# Patient Record
Sex: Female | Born: 1955 | Race: White | Hispanic: No | Marital: Married | Smoking: Former smoker
Health system: Southern US, Community
[De-identification: ages and names within clinical notes are randomized; demographics above are authoritative.]

## PROBLEM LIST (undated history)

## (undated) DIAGNOSIS — R112 Nausea with vomiting, unspecified: Secondary | ICD-10-CM

## (undated) DIAGNOSIS — Z87442 Personal history of urinary calculi: Secondary | ICD-10-CM

## (undated) DIAGNOSIS — F329 Major depressive disorder, single episode, unspecified: Secondary | ICD-10-CM

## (undated) DIAGNOSIS — Z923 Personal history of irradiation: Secondary | ICD-10-CM

## (undated) DIAGNOSIS — R0600 Dyspnea, unspecified: Secondary | ICD-10-CM

## (undated) DIAGNOSIS — Z9889 Other specified postprocedural states: Secondary | ICD-10-CM

## (undated) DIAGNOSIS — K759 Inflammatory liver disease, unspecified: Secondary | ICD-10-CM

## (undated) DIAGNOSIS — F32A Depression, unspecified: Secondary | ICD-10-CM

## (undated) DIAGNOSIS — E785 Hyperlipidemia, unspecified: Secondary | ICD-10-CM

## (undated) DIAGNOSIS — C801 Malignant (primary) neoplasm, unspecified: Secondary | ICD-10-CM

## (undated) DIAGNOSIS — B029 Zoster without complications: Secondary | ICD-10-CM

## (undated) DIAGNOSIS — Z9221 Personal history of antineoplastic chemotherapy: Secondary | ICD-10-CM

## (undated) DIAGNOSIS — Z803 Family history of malignant neoplasm of breast: Secondary | ICD-10-CM

## (undated) DIAGNOSIS — Z808 Family history of malignant neoplasm of other organs or systems: Secondary | ICD-10-CM

## (undated) DIAGNOSIS — Z800 Family history of malignant neoplasm of digestive organs: Secondary | ICD-10-CM

## (undated) DIAGNOSIS — G43909 Migraine, unspecified, not intractable, without status migrainosus: Secondary | ICD-10-CM

## (undated) DIAGNOSIS — G4700 Insomnia, unspecified: Secondary | ICD-10-CM

## (undated) HISTORY — DX: Major depressive disorder, single episode, unspecified: F32.9

## (undated) HISTORY — DX: Insomnia, unspecified: G47.00

## (undated) HISTORY — PX: TUBAL LIGATION: SHX77

## (undated) HISTORY — DX: Family history of malignant neoplasm of breast: Z80.3

## (undated) HISTORY — PX: BREAST EXCISIONAL BIOPSY: SUR124

## (undated) HISTORY — DX: Hyperlipidemia, unspecified: E78.5

## (undated) HISTORY — DX: Inflammatory liver disease, unspecified: K75.9

## (undated) HISTORY — DX: Migraine, unspecified, not intractable, without status migrainosus: G43.909

## (undated) HISTORY — DX: Depression, unspecified: F32.A

## (undated) HISTORY — DX: Family history of malignant neoplasm of other organs or systems: Z80.8

## (undated) HISTORY — DX: Family history of malignant neoplasm of digestive organs: Z80.0

## (undated) HISTORY — DX: Zoster without complications: B02.9

---

## 2000-08-26 HISTORY — PX: KNEE ARTHROSCOPY: SUR90

## 2001-08-26 DIAGNOSIS — B029 Zoster without complications: Secondary | ICD-10-CM

## 2001-08-26 HISTORY — DX: Zoster without complications: B02.9

## 2003-01-28 ENCOUNTER — Ambulatory Visit (HOSPITAL_BASED_OUTPATIENT_CLINIC_OR_DEPARTMENT_OTHER): Admission: RE | Admit: 2003-01-28 | Discharge: 2003-01-28 | Payer: Self-pay | Admitting: Orthopedic Surgery

## 2003-02-10 ENCOUNTER — Encounter: Admission: RE | Admit: 2003-02-10 | Discharge: 2003-03-04 | Payer: Self-pay | Admitting: Orthopedic Surgery

## 2010-01-17 ENCOUNTER — Other Ambulatory Visit: Admission: RE | Admit: 2010-01-17 | Discharge: 2010-01-17 | Payer: Self-pay | Admitting: Internal Medicine

## 2010-01-17 LAB — HM PAP SMEAR

## 2010-01-31 ENCOUNTER — Encounter: Admission: RE | Admit: 2010-01-31 | Discharge: 2010-01-31 | Payer: Self-pay | Admitting: Internal Medicine

## 2010-02-12 ENCOUNTER — Ambulatory Visit (HOSPITAL_COMMUNITY): Admission: RE | Admit: 2010-02-12 | Discharge: 2010-02-12 | Payer: Self-pay | Admitting: Internal Medicine

## 2011-01-11 NOTE — Op Note (Signed)
   NAME:  [name], Rebecca                            ACCOUNT NO.:  [id number]   MEDICAL RECORD NO.:  [id number]                   PATIENT TYPE:  AMB   LOCATION:  [facility]SC                                  FACILITY:  [facility]   PHYSICIAN:  Thera Flake., M.D.             DATE OF BIRTH:  Aug 02, 1956   DATE OF PROCEDURE:  01/28/2003  DATE OF DISCHARGE:                                 OPERATIVE REPORT   INDICATIONS FOR PROCEDURE:  A 55 year old with a MRI proven bucket-handle  meniscus tear of the right knee thought to be amendable to outpatient  arthroscopy.   PREOPERATIVE DIAGNOSIS:  Bucket-handle tear of the medial meniscus, right  knee.   POSTOPERATIVE DIAGNOSIS:  Bucket-handle tear of the medial meniscus, right  knee.   OPERATION:  Partial medial meniscectomy (approximately 50%).   SURGEON:  Dyke Brackett, M.D.   ANESTHESIA:  MAC   DESCRIPTION OF PROCEDURE:  Inferior medial inferolateral portal,  patellofemoral joint, lateral compartment, lateral meniscus, ACL and PCL  were essentially normal. The medial meniscus showed a bucket-handle tear. It  was more of a bucket-handle component that I would say would be at the  posterior half of the meniscus. It required a majority of the posterior horn  did have to be resected and at most 50% of the meniscus was resected, 50%  was preserved. Some mild chondral damage from the meniscus tear but nothing  severe, this was debrided as well. Knee drained free of fluid, closed with  nylon, lightly compressive sterile dressing applied, taken to the recovery  room in stable condition.                                               Thera Flake., M.D.    WDC/MEDQ  D:  01/28/2003  T:  01/28/2003  Job:  [id number]

## 2012-11-17 ENCOUNTER — Other Ambulatory Visit: Payer: Self-pay | Admitting: Obstetrics and Gynecology

## 2012-11-17 DIAGNOSIS — D259 Leiomyoma of uterus, unspecified: Secondary | ICD-10-CM

## 2012-11-17 DIAGNOSIS — N920 Excessive and frequent menstruation with regular cycle: Secondary | ICD-10-CM

## 2013-03-08 LAB — HM COLONOSCOPY

## 2013-06-20 ENCOUNTER — Encounter: Payer: Self-pay | Admitting: Internal Medicine

## 2013-06-21 ENCOUNTER — Encounter: Payer: Self-pay | Admitting: Internal Medicine

## 2013-07-02 ENCOUNTER — Ambulatory Visit: Payer: BC Managed Care – PPO | Admitting: Emergency Medicine

## 2013-07-02 ENCOUNTER — Encounter: Payer: Self-pay | Admitting: Internal Medicine

## 2013-07-02 ENCOUNTER — Encounter: Payer: Self-pay | Admitting: Emergency Medicine

## 2013-07-02 VITALS — BP 138/64 | HR 60 | Temp 98.6°F | Resp 16 | Ht 67.0 in | Wt 155.0 lb

## 2013-07-02 DIAGNOSIS — G4700 Insomnia, unspecified: Secondary | ICD-10-CM | POA: Insufficient documentation

## 2013-07-02 DIAGNOSIS — R030 Elevated blood-pressure reading, without diagnosis of hypertension: Secondary | ICD-10-CM

## 2013-07-02 DIAGNOSIS — E785 Hyperlipidemia, unspecified: Secondary | ICD-10-CM | POA: Insufficient documentation

## 2013-07-02 DIAGNOSIS — E559 Vitamin D deficiency, unspecified: Secondary | ICD-10-CM

## 2013-07-02 LAB — CBC WITH DIFFERENTIAL/PLATELET
Basophils Absolute: 0 10*3/uL (ref 0.0–0.1)
Eosinophils Relative: 1 % (ref 0–5)
HCT: 41.6 % (ref 36.0–46.0)
Lymphocytes Relative: 25 % (ref 12–46)
Lymphs Abs: 1.5 10*3/uL (ref 0.7–4.0)
MCV: 89.3 fL (ref 78.0–100.0)
Monocytes Absolute: 0.5 10*3/uL (ref 0.1–1.0)
Neutro Abs: 4 10*3/uL (ref 1.7–7.7)
RBC: 4.66 MIL/uL (ref 3.87–5.11)
WBC: 6 10*3/uL (ref 4.0–10.5)

## 2013-07-02 LAB — HEPATIC FUNCTION PANEL
ALT: 10 U/L (ref 0–35)
Albumin: 4.5 g/dL (ref 3.5–5.2)
Alkaline Phosphatase: 49 U/L (ref 39–117)
Bilirubin, Direct: 0.2 mg/dL (ref 0.0–0.3)
Indirect Bilirubin: 1.3 mg/dL — ABNORMAL HIGH (ref 0.0–0.9)
Total Bilirubin: 1.5 mg/dL — ABNORMAL HIGH (ref 0.3–1.2)
Total Protein: 7.3 g/dL (ref 6.0–8.3)

## 2013-07-02 LAB — LIPID PANEL
HDL: 70 mg/dL (ref >39)
LDL Cholesterol: 123 mg/dL — ABNORMAL HIGH (ref 0–99)
Triglycerides: 50 mg/dL (ref <150)
VLDL: 10 mg/dL (ref 0–40)

## 2013-07-02 LAB — MAGNESIUM: Magnesium: 1.8 mg/dL (ref 1.5–2.5)

## 2013-07-02 MED ORDER — ZOLPIDEM TARTRATE 5 MG PO TABS: 5.0000 mg | ORAL_TABLET | Freq: Every evening | ORAL | Status: DC | PRN

## 2013-07-02 NOTE — Patient Instructions (Signed)
Hypertension Hypertension is another name for high blood pressure. High blood pressure may mean that your heart needs to work harder to pump blood. Blood pressure consists of two numbers, which includes a higher number over a lower number (example: 110/72). HOME CARE   Make lifestyle changes as told by your doctor. This may include weight loss and exercise.  Take your blood pressure medicine every day.  Limit how much salt you use.  Stop smoking if you smoke.  Do not use drugs.  Talk to your doctor if you are using decongestants or birth control pills. These medicines might make blood pressure higher.  Females should not drink more than 1 alcoholic drink per day. Males should not drink more than 2 alcoholic drinks per day.  See your doctor as told. GET HELP RIGHT AWAY IF:   You have a blood pressure reading with a top number of 180 or higher.  You get a very bad headache.  You get blurred or changing vision.  You feel confused.  You feel weak, numb, or faint.  You get chest or belly (abdominal) pain.  You throw up (vomit).  You cannot breathe very well. MAKE SURE YOU:   Understand these instructions.  Will watch your condition.  Will get help right away if you are not doing well or get worse. Document Released: 01/29/2008 Document Revised: 11/04/2011 Document Reviewed: 01/29/2008 [facility] Patient Information 2014 [place], [place]. Hypercholesterolemia High Blood Cholesterol Cholesterol is a white, waxy, fat-like protein needed by your body in small amounts. The liver makes all the cholesterol you need. It is carried from the liver by the blood through the blood vessels. Deposits (plaque) may build up on blood vessel walls. This makes the arteries narrower and stiffer. Plaque increases the risk for heart attack and stroke. You cannot feel your cholesterol level even if it is very high. The only way to know is by a blood test to check your lipid (fats) levels. Once you  know your cholesterol levels, you should keep a record of the test results. Work with your caregiver to to keep your levels in the desired range. WHAT THE RESULTS MEAN:  Total cholesterol is a rough measure of all the cholesterol in your blood.  LDL is the so-called bad cholesterol. This is the type that deposits cholesterol in the walls of the arteries. You want this level to be low.  HDL is the good cholesterol because it cleans the arteries and carries the LDL away. You want this level to be high.  Triglycerides are fat that the body can either burn for energy or store. High levels are closely linked to heart disease. DESIRED LEVELS:  Total cholesterol below 200.  LDL below 100 for people at risk, below 70 for very high risk.  HDL above 50 is good, above 60 is best.  Triglycerides below 150. HOW TO LOWER YOUR CHOLESTEROL:  Diet.  Choose fish or white meat chicken and [place], roasted or baked. Limit fatty cuts of red meat, fried foods, and processed meats, such as sausage and lunch meat.  Eat lots of fresh fruits and vegetables. Choose whole grains, beans, pasta, potatoes and cereals.  Use only small amounts of olive, corn or canola oils. Avoid butter, mayonnaise, shortening or palm kernel oils. Avoid foods with trans-fats.  Use skim/nonfat milk and low-fat/nonfat yogurt and cheeses. Avoid whole milk, cream, ice cream, egg yolks and cheeses. Healthy desserts include angel food cake, gingersnaps, animal crackers, hard candy, popsicles, and low-fat/nonfat frozen yogurt.  Avoid pastries, cakes, pies and cookies.  Exercise.  A regular program helps decrease LDL and raises HDL.  Helps with weight control.  Do things that increase your activity level like gardening, walking, or taking the stairs.  Medication.  May be prescribed by your caregiver to help lowering cholesterol and the risk for heart disease.  You may need medicine even if your levels are normal if you have several  risk factors. HOME CARE INSTRUCTIONS   Follow your diet and exercise programs as suggested by your caregiver.  Take medications as directed.  Have blood work done when your caregiver feels it is necessary. MAKE SURE YOU:   Understand these instructions.  Will watch your condition.  Will get help right away if you are not doing well or get worse. Document Released: 08/12/2005 Document Revised: 11/04/2011 Document Reviewed: 01/28/2007 [facility] Patient Information 2014 [place], [place]. Insomnia Insomnia means you have trouble falling or staying asleep. It affects about one person in three at different times and is usually related to stress from work, school, or personal relations. Insomnia is also a sign of depression or anxiety. Other medical problems that cause insomnia include conditions that cause pain, night leg cramps, coughing, shortness of breath, urinary problems, and fevers. Sleep apnea is an abnormal breathing pattern at night that can cause insomnia and loud snoring. Certain medications and excess intake of caffeine drinks (coffee, tea, colas) can also interfere with normal sleep. Treatment for insomnia depends on the cause. Besides specific medical treatment, the following measures can help you relax and get better sleep. Get regular exercise every day, at least several hours before bed time. Try to get to bed at the same time every night. Take a hot bath before retiring to help you relax. Do not stay in bed if you are unable to sleep. During the daytime avoid staying in bed to watch television, eat, or read. Reduce unwanted noise and light in your room. Keep your room at a comfortable temperature. Avoid alcohol as it causes one to sleep less soundly, may cause you to awaken during the night, and can leave you feeling groggy the next day. Using a mild sedative prescribed or suggested by your caregiver may be needed, but the daily use of sleeping pills is not recommended. Anti-depressant  medicines can improve sleep in people with depression. Please call your doctor for follow up care to better understand the cause and proper treatment of your insomnia. Document Released: 09/19/2004 Document Revised: 11/04/2011 Document Reviewed: 08/12/2005 [facility] Patient Information 2014 [place], [place].

## 2013-07-05 NOTE — Progress Notes (Signed)
  Subjective:    Patient ID: Rebecca [name], female    DOB: October 06, 1955, 57 y.o.   MRN: [id number]  HPI Comments: 57 yo female presents for recheck cholesterol with declining RX for Statins or herbals and desire to try with dietary changes. Last labs T 247 TG 104 HDL 70 LDL 156. She has been doing well overall. She notes her sleep is still interupted even with the Ambien. Denies any new stressors.   Hyperlipidemia    Current Outpatient Prescriptions on File Prior to Visit  Medication Sig Dispense Refill  . ambrisentan (LETAIRIS) 5 MG tablet Take 5 mg by mouth daily.       No current facility-administered medications on file prior to visit.   ALLERGIES Citalopram; Citalopram; Dexamethasone; Dexamethasone; and Red yeast rice  Past Medical History  Diagnosis Date  . Hyperlipidemia   . Insomnia   . Migraines     HORMONAL  . Shingles outbreak 2003    RIGHT HIP  . Depression     REMISSION/ SITUATIONAL  . Hepatitis AGE 44    RESOLVED     Review of Systems  Psychiatric/Behavioral: Positive for sleep disturbance.  All other systems reviewed and are negative.    BP 138/64  Pulse 60  Temp(Src) 98.6 F (37 C) (Temporal)  Resp 16  Ht 5\' 7"  (1.702 m)  Wt 155 lb (70.308 kg)  BMI 24.27 kg/m2     Objective:   Physical Exam  Nursing note and vitals reviewed. Constitutional: She is oriented to person, place, and time. She appears well-developed and well-nourished.  HENT:  Head: Normocephalic and atraumatic.  Right Ear: External ear normal.  Left Ear: External ear normal.  Nose: Nose normal.  Mouth/Throat: Oropharynx is clear and moist.  Large tongue partially obstructs airway  Eyes: EOM are normal.  Neck: Normal range of motion. Neck supple.  Cardiovascular: Normal rate, regular rhythm, normal heart sounds and intact distal pulses.   Pulmonary/Chest: Effort normal and breath sounds normal.  Abdominal: Soft. Bowel sounds are normal.  Musculoskeletal: Normal range of motion.   Neurological: She is alert and oriented to person, place, and time.  Skin: Skin is warm and dry.  Psychiatric: She has a normal mood and affect. Judgment normal.          Assessment & Plan:  Cholesterol recheck with labs. Insomnia history advised of sleep hygiene w/c if symptoms do not improve for sleep study.

## 2013-08-31 ENCOUNTER — Other Ambulatory Visit: Payer: Self-pay | Admitting: Emergency Medicine

## 2013-08-31 DIAGNOSIS — G4700 Insomnia, unspecified: Secondary | ICD-10-CM

## 2013-08-31 MED ORDER — ZOLPIDEM TARTRATE 5 MG PO TABS: 5.0000 mg | ORAL_TABLET | Freq: Every evening | ORAL | Status: DC | PRN

## 2013-09-02 ENCOUNTER — Other Ambulatory Visit: Payer: Self-pay | Admitting: Emergency Medicine

## 2013-09-02 DIAGNOSIS — G4700 Insomnia, unspecified: Secondary | ICD-10-CM

## 2013-09-02 MED ORDER — ZOLPIDEM TARTRATE 5 MG PO TABS: 5.0000 mg | ORAL_TABLET | Freq: Every evening | ORAL | Status: DC | PRN

## 2013-09-03 ENCOUNTER — Other Ambulatory Visit: Payer: Self-pay | Admitting: Emergency Medicine

## 2013-09-03 MED ORDER — PROMETHAZINE HCL 25 MG PO TABS: 25.0000 mg | ORAL_TABLET | Freq: Four times a day (QID) | ORAL | Status: DC | PRN

## 2013-09-03 MED ORDER — BUTALBITAL-APAP-CAFFEINE 50-325-40 MG PO TABS: 1.0000 | ORAL_TABLET | Freq: Four times a day (QID) | ORAL | Status: DC | PRN

## 2013-09-28 ENCOUNTER — Telehealth: Payer: Self-pay

## 2013-09-28 NOTE — Telephone Encounter (Signed)
Please advise patient Rebecca [name] needs OV, rash could be shingles/ yeast  Or infection.

## 2013-09-28 NOTE — Telephone Encounter (Signed)
Pt called. States she's had a rash under breast for about a week. Thinks it may be shingles. Denies pain. Told pt she sched OV to evaluate, but wants your opinion. Please advise.

## 2013-09-29 ENCOUNTER — Encounter: Payer: Self-pay | Admitting: Emergency Medicine

## 2013-09-29 ENCOUNTER — Ambulatory Visit (INDEPENDENT_AMBULATORY_CARE_PROVIDER_SITE_OTHER): Payer: BC Managed Care – PPO | Admitting: Emergency Medicine

## 2013-09-29 VITALS — BP 138/72 | HR 62 | Temp 98.0°F | Resp 16 | Ht 67.0 in | Wt 155.0 lb

## 2013-09-29 DIAGNOSIS — R5381 Other malaise: Secondary | ICD-10-CM

## 2013-09-29 DIAGNOSIS — E782 Mixed hyperlipidemia: Secondary | ICD-10-CM

## 2013-09-29 DIAGNOSIS — G4700 Insomnia, unspecified: Secondary | ICD-10-CM

## 2013-09-29 DIAGNOSIS — I10 Essential (primary) hypertension: Secondary | ICD-10-CM

## 2013-09-29 DIAGNOSIS — R5383 Other fatigue: Secondary | ICD-10-CM

## 2013-09-29 DIAGNOSIS — R21 Rash and other nonspecific skin eruption: Secondary | ICD-10-CM

## 2013-09-29 LAB — CBC WITH DIFFERENTIAL/PLATELET
BASOS PCT: 1 % (ref 0–1)
Basophils Absolute: 0 10*3/uL (ref 0.0–0.1)
Eosinophils Absolute: 0 10*3/uL (ref 0.0–0.7)
Eosinophils Relative: 1 % (ref 0–5)
HEMATOCRIT: 39.8 % (ref 36.0–46.0)
HEMOGLOBIN: 13.5 g/dL (ref 12.0–15.0)
LYMPHS ABS: 1.5 10*3/uL (ref 0.7–4.0)
LYMPHS PCT: 27 % (ref 12–46)
MCH: 30.2 pg (ref 26.0–34.0)
MCHC: 33.9 g/dL (ref 30.0–36.0)
MCV: 89 fL (ref 78.0–100.0)
MONOS PCT: 7 % (ref 3–12)
Monocytes Absolute: 0.4 10*3/uL (ref 0.1–1.0)
NEUTROS ABS: 3.8 10*3/uL (ref 1.7–7.7)
NEUTROS PCT: 64 % (ref 43–77)
PLATELETS: 221 10*3/uL (ref 150–400)
RBC: 4.47 MIL/uL (ref 3.87–5.11)
RDW: 13.4 % (ref 11.5–15.5)
WBC: 5.8 10*3/uL (ref 4.0–10.5)

## 2013-09-29 LAB — HEPATIC FUNCTION PANEL
ALT: 19 U/L (ref 0–35)
AST: 17 U/L (ref 0–37)
Albumin: 4.4 g/dL (ref 3.5–5.2)
Alkaline Phosphatase: 49 U/L (ref 39–117)
BILIRUBIN DIRECT: 0.2 mg/dL (ref 0.0–0.3)
BILIRUBIN INDIRECT: 1.2 mg/dL (ref 0.2–1.2)
BILIRUBIN TOTAL: 1.4 mg/dL — AB (ref 0.2–1.2)
TOTAL PROTEIN: 6.9 g/dL (ref 6.0–8.3)

## 2013-09-29 LAB — BASIC METABOLIC PANEL WITH GFR
BUN: 14 mg/dL (ref 6–23)
CO2: 27 mEq/L (ref 19–32)
Calcium: 9.1 mg/dL (ref 8.4–10.5)
Chloride: 101 mEq/L (ref 96–112)
Creat: 0.5 mg/dL (ref 0.50–1.10)
GFR, Est African American: >89 mL/min
GFR, Est Non African American: >89 mL/min
Glucose, Bld: 86 mg/dL (ref 70–99)
POTASSIUM: 4.2 meq/L (ref 3.5–5.3)
SODIUM: 138 meq/L (ref 135–145)

## 2013-09-29 LAB — LIPID PANEL
CHOLESTEROL: 207 mg/dL — AB (ref 0–200)
HDL: 75 mg/dL (ref >39)
LDL Cholesterol: 117 mg/dL — ABNORMAL HIGH (ref 0–99)
Total CHOL/HDL Ratio: 2.8 Ratio
Triglycerides: 73 mg/dL (ref <150)
VLDL: 15 mg/dL (ref 0–40)

## 2013-09-29 LAB — SEDIMENTATION RATE: Sed Rate: 1 mm/hr (ref 0–22)

## 2013-09-29 MED ORDER — FLUCONAZOLE 150 MG PO TABS: 150.0000 mg | ORAL_TABLET | ORAL | Status: DC

## 2013-09-29 MED ORDER — DOXYCYCLINE HYCLATE 100 MG PO TABS: 100.0000 mg | ORAL_TABLET | Freq: Two times a day (BID) | ORAL | Status: DC

## 2013-09-29 MED ORDER — ZOLPIDEM TARTRATE 5 MG PO TABS: 5.0000 mg | ORAL_TABLET | Freq: Every evening | ORAL | Status: DC | PRN

## 2013-09-29 NOTE — Patient Instructions (Addendum)
Shingles Shingles is caused by the same virus that causes chickenpox. The first feelings may be pain or tingling. A rash will follow in a couple days. The rash may occur on any area of the body. Long-lasting pain is more likely in an elderly person. It can last months to years. There are medicines that can help prevent pain if you start taking them early. HOME CARE   Place cool cloths on the rash.  Only take medicine as told by your doctor.  You may use calamine lotion to relieve itchy skin.  Avoid touching:  Babies.  Children with inflamed skin (eczema).  People who have gotten transplanted organs.  People with chronic illnesses, such as leukemia and AIDS.  If the rash is on the face, you may need to see a specialist. Keep all appointments. Shingles must be kept away from the eyes, if possible.  Keep all appointments.  Avoid touching the eyes or eye area, if possible. GET HELP RIGHT AWAY IF:   You have any pain on the face or eye.  Your medicines do not help.  Your redness or puffiness (swelling) spreads.  You have a fever.  You notice any red lines going away from the rash area. MAKE SURE YOU:   Understand these instructions.  Will watch your condition.  Will get help right away if you are not doing well or get worse. Document Released: 01/29/2008 Document Revised: 11/04/2011 Document Reviewed: 01/29/2008 [facility] Patient Information 2014 [place], [place]. Rash A rash is a change in the color or feel of your skin. There are many different types of rashes. You may have other problems along with your rash. HOME CARE  Avoid the thing that caused your rash.  Do not scratch your rash.  You may take cools baths to help stop itching.  Only take medicines as told by your doctor.  Keep all doctor visits as told. GET HELP RIGHT AWAY IF:   Your pain, puffiness (swelling), or redness gets worse.  You have a fever.  You have new or severe problems.  You have body  aches, watery poop (diarrhea), or you throw up (vomit).  Your rash is not better after 3 days. MAKE SURE YOU:   Understand these instructions.  Will watch your condition.  Will get help right away if you are not doing well or get worse. Document Released: 01/29/2008 Document Revised: 11/04/2011 Document Reviewed: 05/27/2011 [facility] Patient Information 2014 [place], [place]. Menopause Periden c Vitamin for mood Menopause is the normal time of life when menstrual periods stop completely. Menopause is complete when you have missed 12 consecutive menstrual periods. It usually occurs between the ages of 18 years and 1 years. Very rarely does a woman develop menopause before the age of 1 years. At menopause, your ovaries stop producing the female hormones estrogen and progesterone. This can cause undesirable symptoms and also affect your health. Sometimes the symptoms may occur 4 5 years before the menopause begins. There is no relationship between menopause and:  Oral contraceptives.  Number of children you had.  Race.  The age your menstrual periods started (menarche). Heavy smokers and very thin women may develop menopause earlier in life. CAUSES  The ovaries stop producing the female hormones estrogen and progesterone.  Other causes include:  Surgery to remove both ovaries.  The ovaries stop functioning for no known reason.  Tumors of the pituitary gland in the brain.  Medical disease that affects the ovaries and hormone production.  Radiation treatment to the  abdomen or pelvis.  Chemotherapy that affects the ovaries. SYMPTOMS   Hot flashes.  Night sweats.  Decrease in sex drive.  Vaginal dryness and thinning of the vagina causing painful intercourse.  Dryness of the skin and developing wrinkles.  Headaches.  Tiredness.  Irritability.  Memory problems.  Weight gain.  Bladder infections.  Hair growth of the face and chest.  Infertility. More serious  symptoms include:  Loss of bone (osteoporosis) causing breaks (fractures).  Depression.  Hardening and narrowing of the arteries (atherosclerosis) causing heart attacks and strokes. DIAGNOSIS   When the menstrual periods have stopped for 12 straight months.  Physical exam.  Hormone studies of the blood. TREATMENT  There are many treatment choices and nearly as many questions about them. The decisions to treat or not to treat menopausal changes is an individual choice made with your health care provider. Your health care provider can discuss the treatments with you. Together, you can decide which treatment will work best for you. Your treatment choices may include:   Hormone therapy (estrogen and progesterone).  Non-hormonal medicines.  Treating the individual symptoms with medicine (for example antidepressants for depression).  Herbal medicines that may help specific symptoms.  Counseling by a psychiatrist or psychologist.  Group therapy.  Lifestyle changes including:  Eating healthy.  Regular exercise.  Limiting caffeine and alcohol.  Stress management and meditation.  No treatment. HOME CARE INSTRUCTIONS   Take the medicine your health care provider gives you as directed.  Get plenty of sleep and rest.  Exercise regularly.  Eat a diet that contains calcium (good for the bones) and soy products (acts like estrogen hormone).  Avoid alcoholic beverages.  Do not smoke.  If you have hot flashes, dress in layers.  Take supplements, calcium, and vitamin D to strengthen bones.  You can use over-the-counter lubricants or moisturizers for vaginal dryness.  Group therapy is sometimes very helpful.  Acupuncture may be helpful in some cases. SEEK MEDICAL CARE IF:   You are not sure you are in menopause.  You are having menopausal symptoms and need advice and treatment.  You are still having menstrual periods after age 34 years.  You have pain with  intercourse.  Menopause is complete (no menstrual period for 12 months) and you develop vaginal bleeding.  You need a referral to a specialist (gynecologist, psychiatrist, or psychologist) for treatment. SEEK IMMEDIATE MEDICAL CARE IF:   You have severe depression.  You have excessive vaginal bleeding.  You fell and think you have a broken bone.  You have pain when you urinate.  You develop leg or chest pain.  You have a fast pounding heart beat (palpitations).  You have severe headaches.  You develop vision problems.  You feel a lump in your breast.  You have abdominal pain or severe indigestion. Document Released: 11/02/2003 Document Revised: 04/14/2013 Document Reviewed: 03/11/2013 [facility] Patient Information 2014 [place], [place].

## 2013-09-29 NOTE — Progress Notes (Signed)
Subjective:    Patient ID: Rebecca [name], female    DOB: 12-Feb-1956, 58 y.o.   MRN: [id number]  HPI Comments: 58 yo female presents with ongoing rash. She notes 09/18/13 rash started under breasts. 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 She notes itch and tingle. She notes no new/ unusual exposures. SHE HAS ALSO NOTICED INCREASED FATIGUE with out any known cause.  She notes mammo up to date and she has started menopause without cycle x 2 months. She has noticed mild mood fluctuations. She denies hot flashes. She wants to try to manage with herbals with FHX breast CA.   She also needs presents for 3 month F/U for Cholesterol,  D. Deficient. She is trying to decrease bad diet and improve exercise. SHe did not add more supplements for cholesterol and doe not want to start cholesterol RX.  LAST LABS CHOL         207   09/29/2013 HDL           75   09/29/2013 LDLCALC      117   09/29/2013 TRIG          73   09/29/2013 CHOLHDL      2.8   09/29/2013 ALT           19   09/29/2013 AST           17   09/29/2013 ALKPHOS       49   09/29/2013 BILITOT      1.4   09/29/2013 CREATININE     0.50   09/29/2013 BUN              14   09/29/2013 NA              138   09/29/2013 K               4.2   09/29/2013 CL              101   09/29/2013 CO2              27   09/29/2013 WBC      5.8   09/29/2013 HGB     13.5   09/29/2013 HCT     39.8   09/29/2013 MCV     89.0   09/29/2013 PLT      221   09/29/2013    Current Outpatient Prescriptions on File Prior to Visit  Medication Sig Dispense Refill  . cholecalciferol (VITAMIN D) 1000 UNITS tablet Take 1,000 Units by mouth daily.      . magnesium gluconate (MAGONATE) 500 MG tablet Take 500 mg by mouth. 1/2 qd      . butalbital-acetaminophen-caffeine (FIORICET, ESGIC) 50-325-40 MG per tablet Take 1 tablet by mouth every 6 (six) hours as needed for headache.  30 tablet  0  . promethazine (PHENERGAN) 25 MG tablet Take 1 tablet (25 mg total) by mouth every 6 (six) hours  as needed for nausea or vomiting. For nausea associated with migraine  30 tablet  0   No current facility-administered medications on file prior to visit.   ALLERGIES Allergies  Allergen Reactions  . Citalopram Other (See Comments)    HYPERACTIVITY  . Citalopram     Hyper  . Dexamethasone Other (See Comments)    CHEST TIGHTNESS  . Dexamethasone     Chest tightness  . Red Yeast Rice [Cholestin] Other (See Comments)    headache  Past Medical History  Diagnosis Date  . Hyperlipidemia   . Insomnia   . Migraines     HORMONAL  . Shingles outbreak 2003    RIGHT HIP  . Depression     REMISSION/ SITUATIONAL  . Hepatitis AGE 29    RESOLVED      Review of Systems  Constitutional: Positive for fatigue.  Skin: Positive for rash.  All other systems reviewed and are negative.       Objective:   Physical Exam  Nursing note and vitals reviewed. Constitutional: She is oriented to person, place, and time. She appears well-developed and well-nourished. No distress.  HENT:  Head: Normocephalic and atraumatic.  Right Ear: External ear normal.  Left Ear: External ear normal.  Nose: Nose normal.  Mouth/Throat: Oropharynx is clear and moist.  Eyes: Conjunctivae and EOM are normal.  Neck: Normal range of motion. Neck supple. No JVD present. No thyromegaly present.  Cardiovascular: Normal rate, regular rhythm, normal heart sounds and intact distal pulses.   Pulmonary/Chest: Effort normal and breath sounds normal.  Abdominal: Soft. Bowel sounds are normal. She exhibits no distension and no mass. There is no tenderness. There is no rebound and no guarding.  Genitourinary:  Breast are Fibrocystic  Musculoskeletal: Normal range of motion. She exhibits no edema and no tenderness.  Lymphadenopathy:    She has no cervical adenopathy.  Neurological: She is alert and oriented to person, place, and time. No cranial nerve deficit.  Skin: Skin is warm and dry. No rash noted. No erythema. No  pallor.  Under each breast and between breasts erythematous papules 3- mm elevated almost blister appearing, chest area splotchy red flat   Psychiatric: She has a normal mood and affect. Her behavior is normal. Judgment and thought content normal.          Assessment & Plan:  1.  3 month F/U for Cholesterol, D. Deficient. Needs healthy diet, cardio QD and obtain healthy weight. Check Labs, Check BP if >130/80 call office 2. Mood changes with new onset menopause- Periden C OTC/ Advised with fHX breast CA no HRT. Declines Br[place]. 3. ? Rash under Both breasts- Needs to get Mammogram updated/ we will request last mammo. Concern for shingles with appearance but with bilateral rash will treat for yeast vs infection- Doxy 100 and Diflucan 150 both AD. CK labs 4. Fatigue- check labs, increase activity and H2O

## 2013-10-12 ENCOUNTER — Ambulatory Visit: Payer: Self-pay | Admitting: Emergency Medicine

## 2013-11-01 ENCOUNTER — Other Ambulatory Visit: Payer: Self-pay | Admitting: Emergency Medicine

## 2013-11-01 DIAGNOSIS — G4700 Insomnia, unspecified: Secondary | ICD-10-CM

## 2013-11-01 MED ORDER — ZOLPIDEM TARTRATE 5 MG PO TABS: 5.0000 mg | ORAL_TABLET | Freq: Every evening | ORAL | Status: DC | PRN

## 2013-12-05 ENCOUNTER — Other Ambulatory Visit: Payer: Self-pay | Admitting: Emergency Medicine

## 2013-12-05 DIAGNOSIS — G4700 Insomnia, unspecified: Secondary | ICD-10-CM

## 2013-12-05 MED ORDER — ZOLPIDEM TARTRATE 5 MG PO TABS: 5.0000 mg | ORAL_TABLET | Freq: Every evening | ORAL | Status: DC | PRN

## 2014-01-13 ENCOUNTER — Telehealth: Payer: Self-pay | Admitting: Internal Medicine

## 2014-01-13 ENCOUNTER — Other Ambulatory Visit: Payer: Self-pay | Admitting: Emergency Medicine

## 2014-01-13 DIAGNOSIS — G4700 Insomnia, unspecified: Secondary | ICD-10-CM

## 2014-01-13 MED ORDER — ZOLPIDEM TARTRATE 5 MG PO TABS: 5.0000 mg | ORAL_TABLET | Freq: Every evening | ORAL | Status: DC | PRN

## 2014-01-13 NOTE — Telephone Encounter (Signed)
renewal request: zolpidem (AMBIEN) 5 MG tablet

## 2014-01-31 LAB — HM MAMMOGRAPHY

## 2014-03-09 ENCOUNTER — Other Ambulatory Visit: Payer: Self-pay | Admitting: Emergency Medicine

## 2014-03-09 DIAGNOSIS — G4700 Insomnia, unspecified: Secondary | ICD-10-CM

## 2014-03-09 MED ORDER — ZOLPIDEM TARTRATE 5 MG PO TABS: 5.0000 mg | ORAL_TABLET | Freq: Every evening | ORAL | Status: DC | PRN

## 2014-03-21 ENCOUNTER — Encounter: Payer: Self-pay | Admitting: Physician Assistant

## 2014-03-21 ENCOUNTER — Ambulatory Visit (INDEPENDENT_AMBULATORY_CARE_PROVIDER_SITE_OTHER): Payer: BC Managed Care – PPO | Admitting: Physician Assistant

## 2014-03-21 VITALS — BP 100/62 | HR 64 | Temp 97.7°F | Resp 16 | Ht 67.0 in | Wt 159.0 lb

## 2014-03-21 DIAGNOSIS — Z79899 Other long term (current) drug therapy: Secondary | ICD-10-CM

## 2014-03-21 DIAGNOSIS — E785 Hyperlipidemia, unspecified: Secondary | ICD-10-CM

## 2014-03-21 DIAGNOSIS — E559 Vitamin D deficiency, unspecified: Secondary | ICD-10-CM

## 2014-03-21 DIAGNOSIS — G4700 Insomnia, unspecified: Secondary | ICD-10-CM

## 2014-03-21 LAB — BASIC METABOLIC PANEL WITH GFR
BUN: 12 mg/dL (ref 6–23)
CO2: 28 mEq/L (ref 19–32)
CREATININE: 0.6 mg/dL (ref 0.50–1.10)
Calcium: 8.9 mg/dL (ref 8.4–10.5)
Chloride: 102 mEq/L (ref 96–112)
GFR, Est Non African American: >89 mL/min
GLUCOSE: 90 mg/dL (ref 70–99)
Potassium: 4.2 mEq/L (ref 3.5–5.3)
SODIUM: 138 meq/L (ref 135–145)

## 2014-03-21 LAB — CBC WITH DIFFERENTIAL/PLATELET
BASOS PCT: 1 % (ref 0–1)
Basophils Absolute: 0 10*3/uL (ref 0.0–0.1)
EOS ABS: 0 10*3/uL (ref 0.0–0.7)
EOS PCT: 1 % (ref 0–5)
HEMATOCRIT: 40 % (ref 36.0–46.0)
HEMOGLOBIN: 14 g/dL (ref 12.0–15.0)
LYMPHS PCT: 21 % (ref 12–46)
Lymphs Abs: 1 10*3/uL (ref 0.7–4.0)
MCH: 30.3 pg (ref 26.0–34.0)
MCHC: 35 g/dL (ref 30.0–36.0)
MCV: 86.6 fL (ref 78.0–100.0)
MONO ABS: 0.8 10*3/uL (ref 0.1–1.0)
MONOS PCT: 17 % — AB (ref 3–12)
NEUTROS ABS: 2.8 10*3/uL (ref 1.7–7.7)
Neutrophils Relative %: 60 % (ref 43–77)
Platelets: 203 10*3/uL (ref 150–400)
RBC: 4.62 MIL/uL (ref 3.87–5.11)
RDW: 13.1 % (ref 11.5–15.5)
WBC: 4.6 10*3/uL (ref 4.0–10.5)

## 2014-03-21 LAB — HEPATIC FUNCTION PANEL
ALK PHOS: 53 U/L (ref 39–117)
ALT: 16 U/L (ref 0–35)
AST: 14 U/L (ref 0–37)
Albumin: 4.2 g/dL (ref 3.5–5.2)
BILIRUBIN INDIRECT: 0.7 mg/dL (ref 0.2–1.2)
Bilirubin, Direct: 0.1 mg/dL (ref 0.0–0.3)
Total Bilirubin: 0.8 mg/dL (ref 0.2–1.2)
Total Protein: 7.2 g/dL (ref 6.0–8.3)

## 2014-03-21 LAB — LIPID PANEL
CHOL/HDL RATIO: 3.1 ratio
Cholesterol: 214 mg/dL — ABNORMAL HIGH (ref 0–200)
HDL: 70 mg/dL (ref >39)
LDL CALC: 126 mg/dL — AB (ref 0–99)
Triglycerides: 92 mg/dL (ref <150)
VLDL: 18 mg/dL (ref 0–40)

## 2014-03-21 LAB — MAGNESIUM: MAGNESIUM: 2 mg/dL (ref 1.5–2.5)

## 2014-03-21 LAB — TSH: TSH: 1.219 u[IU]/mL (ref 0.350–4.500)

## 2014-03-21 MED ORDER — AZITHROMYCIN 250 MG PO TABS: ORAL_TABLET | ORAL | Status: AC

## 2014-03-21 NOTE — Progress Notes (Signed)
Assessment and Plan:  Hypertension: Continue medication, monitor blood pressure at home. Continue DASH diet. Cholesterol: Continue diet and exercise. Check cholesterol.  Headaches/sinus- ? TMJ triggering migraines versus menopause- information given to the patient, no gum/decrease hard foods, warm wet wash clothes, decrease stress, wear night guard, can do massage, and exercise., take OTC meds for allergies if not better get zpak Vitamin D Def- check level and continue medications.   Continue diet and meds as discussed. Further disposition pending results of labs.  HPI 58 y.o. female  presents for 3 month follow up with hypertension, hyperlipidemia, and vitamin D. Her blood pressure has been controlled at home, today their BP is BP: 100/62 mmHg She does workout. She denies chest pain, shortness of breath, dizziness.  She is not on cholesterol medication and denies myalgias. Her cholesterol is at goal. The cholesterol last visit was:   Lab Results  Component Value Date   CHOL 207* 09/29/2013   HDL 75 09/29/2013   LDLCALC 117* 09/29/2013   TRIG 73 09/29/2013   CHOLHDL 2.8 09/29/2013   Patient is on Vitamin D supplement.   Lab Results  Component Value Date   VD25OH 43 07/02/2013     She has recurrent headaches, worse since menopause, occ takes fiorcet and phenergran. Once a month 3-5 days.  She has had sinus congestion, myalgias, and headache since Saturday.   Current Medications:  Current Outpatient Prescriptions on File Prior to Visit  Medication Sig Dispense Refill  . butalbital-acetaminophen-caffeine (FIORICET, ESGIC) 50-325-40 MG per tablet Take 1 tablet by mouth every 6 (six) hours as needed for headache.  30 tablet  0  . cholecalciferol (VITAMIN D) 1000 UNITS tablet Take 1,000 Units by mouth daily.      Marland Kitchen doxycycline (VIBRA-TABS) 100 MG tablet Take 1 tablet (100 mg total) by mouth 2 (two) times daily.  20 tablet  0  . fluconazole (DIFLUCAN) 150 MG tablet Take 1 tablet (150 mg total) by  mouth once a week.  2 tablet  1  . Lysine 500 MG TABS Take 500 mg by mouth 2 (two) times daily.      . magnesium gluconate (MAGONATE) 500 MG tablet Take 500 mg by mouth. 1/2 qd      . promethazine (PHENERGAN) 25 MG tablet Take 1 tablet (25 mg total) by mouth every 6 (six) hours as needed for nausea or vomiting. For nausea associated with migraine  30 tablet  0  . zolpidem (AMBIEN) 5 MG tablet Take 1 tablet (5 mg total) by mouth at bedtime as needed for sleep.  30 tablet  1   No current facility-administered medications on file prior to visit.   Medical History:  Past Medical History  Diagnosis Date  . Hyperlipidemia   . Insomnia   . Migraines     HORMONAL  . Shingles outbreak 2003    RIGHT HIP  . Depression     REMISSION/ SITUATIONAL  . Hepatitis AGE 58    RESOLVED   Allergies:  Allergies  Allergen Reactions  . Citalopram Other (See Comments)    HYPERACTIVITY  . Citalopram     Hyper  . Dexamethasone Other (See Comments)    CHEST TIGHTNESS  . Dexamethasone     Chest tightness  . Red Yeast Rice [Cholestin] Other (See Comments)    headache     Review of Systems: [X]  = complains of  [ ]  = denies  General: Fatigue [ ]  Fever [ ]  Chills [ ]  Weakness [ ]   Insomnia [ ]  Eyes: Redness [ ]  Blurred vision [ ]  Diplopia [ ]   ENT: Congestion [ ]  Sinus Pain [ ]  Post Nasal Drip [ ]  Sore Throat [ ]  Earache [ ]   Cardiac: Chest pain/pressure [ ]  SOB [ ]  Orthopnea [ ]   Palpitations [ ]   Paroxysmal nocturnal dyspnea[ ]  Claudication [ ]  Edema [ ]   Pulmonary: Cough [ ]  Wheezing[ ]   SOB [ ]   Snoring [ ]   GI: Nausea [ ]  Vomiting[ ]  Dysphagia[ ]  Heartburn[ ]  Abdominal pain [ ]  Constipation [ ] ; Diarrhea [ ] ; BRBPR [ ]  Melena[ ]  GU: Hematuria[ ]  Dysuria [ ]  Nocturia[ ]  Urgency [ ]   Hesitancy [ ]  Discharge [ ]  Neuro: Headaches[ ]  Vertigo[ ]  Paresthesias[ ]  Spasm [ ]  Speech changes [ ]  Incoordination [ ]   Ortho: Arthritis [ ]  Joint pain [ ]  Muscle pain [ ]  Joint swelling [ ]  Back Pain [ ]  Skin:   Rash [ ]   Pruritis [ ]  Change in skin lesion [ ]   Psych: Depression[ ]  Anxiety[ ]  Confusion [ ]  Memory loss [ ]   Heme/Lypmh: Bleeding [ ]  Bruising [ ]  Enlarged lymph nodes [ ]   Endocrine: Visual blurring [ ]  Paresthesia [ ]  Polyuria [ ]  Polydypsea [ ]    Heat/cold intolerance [ ]  Hypoglycemia [ ]   Family history- Review and unchanged Social history- Review and unchanged Physical Exam: BP 100/62  Pulse 64  Temp(Src) 97.7 F (36.5 C)  Resp 16  Ht 5\' 7"  (1.702 m)  Wt 159 lb (72.122 kg)  BMI 24.90 kg/m2 Wt Readings from Last 3 Encounters:  03/21/14 159 lb (72.122 kg)  09/29/13 155 lb (70.308 kg)  07/02/13 155 lb (70.308 kg)   General Appearance: Well nourished, in no apparent distress. Eyes: PERRLA, EOMs, conjunctiva no swelling or erythema Sinuses: + maxillary sinus tenderness ENT/Mouth: Ext aud canals clear, TMs without erythema, bulging. No erythema, swelling, or exudate on post pharynx.  Tonsils not swollen or erythematous. Hearing normal. + TMJ tenderness Neck: Supple, thyroid normal.  Respiratory: Respiratory effort normal, BS equal bilaterally without rales, rhonchi, wheezing or stridor.  Cardio: RRR with no MRGs. Brisk peripheral pulses without edema.  Abdomen: Soft, + BS.  Non tender, no guarding, rebound, hernias, masses. Lymphatics: Non tender without lymphadenopathy.  Musculoskeletal: Full ROM, 5/5 strength, normal gait.  Skin: Warm, dry without rashes, lesions, ecchymosis.  Neuro: Cranial nerves intact. Normal muscle tone, no cerebellar symptoms. Sensation intact.  Psych: Awake and oriented X 3, normal affect, Insight and Judgment appropriate.    Vicie Mutters 10:46 AM

## 2014-03-21 NOTE — Patient Instructions (Signed)
What is the TMJ? The temporomandibular (tem-PUH-ro-man-DIB-yoo-ler) joint, or the TMJ, connects the upper and lower jawbones. This joint allows the jaw to open wide and move back and forth when you chew, talk, or yawn.There are also several muscles that help this joint move. There can be muscle tightness and pain in the muscle that can cause several symptoms.  What causes TMJ pain? There are many causes of TMJ pain. Repeated chewing (for example, chewing gum) and clenching your teeth can cause pain in the joint. Some TMJ pain has no obvious cause. What can I do to ease the pain? There are many things you can do to help your pain get better. When you have pain:  Eat soft foods and stay away from chewy foods (for example, taffy) Try to use both sides of your mouth to chew Don't chew gum Don't open your mouth wide (for example, during yawning or singing) Don't bite your cheeks or fingernails Lower your amount of stress and worry Applying a warm, damp washcloth to the joint may help. Over-the-counter pain medicines such as ibuprofen (one brand: Advil) or acetaminophen (one brand: Tylenol) might also help. Do not use these medicines if you are allergic to them or if your doctor told you not to use them. How can I stop the pain from coming back? When your pain is better, you can do these exercises to make your muscles stronger and to keep the pain from coming back:  Resisted mouth opening: Place your thumb or two fingers under your chin and open your mouth slowly, pushing up lightly on your chin with your thumb. Hold for three to six seconds. Close your mouth slowly. Resisted mouth closing: Place your thumbs under your chin and your two index fingers on the ridge between your mouth and the bottom of your chin. Push down lightly on your chin as you close your mouth. Tongue up: Slowly open and close your mouth while keeping the tongue touching the roof of the mouth. Side-to-side jaw movement: Place an  object about one fourth of an inch thick (for example, two tongue depressors) between your front teeth. Slowly move your jaw from side to side. Increase the thickness of the object as the exercise becomes easier Forward jaw movement: Place an object about one fourth of an inch thick between your front teeth and move the bottom jaw forward so that the bottom teeth are in front of the top teeth. Increase the thickness of the object as the exercise becomes easier. These exercises should not be painful. If it hurts to do these exercises, stop doing them and talk to your family doctor.    

## 2014-03-22 ENCOUNTER — Ambulatory Visit: Payer: Self-pay | Admitting: Physician Assistant

## 2014-05-24 ENCOUNTER — Other Ambulatory Visit: Payer: Self-pay | Admitting: Physician Assistant

## 2014-05-24 DIAGNOSIS — G4700 Insomnia, unspecified: Secondary | ICD-10-CM

## 2014-05-24 MED ORDER — ZOLPIDEM TARTRATE 5 MG PO TABS: 5.0000 mg | ORAL_TABLET | Freq: Every evening | ORAL | Status: DC | PRN

## 2014-08-15 ENCOUNTER — Encounter: Payer: Self-pay | Admitting: Physician Assistant

## 2014-08-15 ENCOUNTER — Ambulatory Visit (INDEPENDENT_AMBULATORY_CARE_PROVIDER_SITE_OTHER): Payer: BC Managed Care – PPO | Admitting: Physician Assistant

## 2014-08-15 VITALS — BP 102/68 | HR 64 | Temp 98.1°F | Resp 16 | Ht 67.0 in | Wt 164.0 lb

## 2014-08-15 DIAGNOSIS — R350 Frequency of micturition: Secondary | ICD-10-CM

## 2014-08-15 DIAGNOSIS — M26609 Unspecified temporomandibular joint disorder, unspecified side: Secondary | ICD-10-CM | POA: Insufficient documentation

## 2014-08-15 DIAGNOSIS — E559 Vitamin D deficiency, unspecified: Secondary | ICD-10-CM

## 2014-08-15 DIAGNOSIS — E785 Hyperlipidemia, unspecified: Secondary | ICD-10-CM

## 2014-08-15 DIAGNOSIS — Z79899 Other long term (current) drug therapy: Secondary | ICD-10-CM

## 2014-08-15 DIAGNOSIS — R631 Polydipsia: Secondary | ICD-10-CM

## 2014-08-15 DIAGNOSIS — M2660 Temporomandibular joint disorder, unspecified: Secondary | ICD-10-CM

## 2014-08-15 LAB — CBC WITH DIFFERENTIAL/PLATELET
Basophils Absolute: 0 10*3/uL (ref 0.0–0.1)
Basophils Relative: 0 % (ref 0–1)
EOS PCT: 1 % (ref 0–5)
Eosinophils Absolute: 0.1 10*3/uL (ref 0.0–0.7)
HCT: 37.4 % (ref 36.0–46.0)
HEMOGLOBIN: 13.1 g/dL (ref 12.0–15.0)
LYMPHS ABS: 1.9 10*3/uL (ref 0.7–4.0)
Lymphocytes Relative: 29 % (ref 12–46)
MCH: 30.3 pg (ref 26.0–34.0)
MCHC: 35 g/dL (ref 30.0–36.0)
MCV: 86.4 fL (ref 78.0–100.0)
MONO ABS: 0.5 10*3/uL (ref 0.1–1.0)
MONOS PCT: 8 % (ref 3–12)
MPV: 10.8 fL (ref 9.4–12.4)
NEUTROS ABS: 4.2 10*3/uL (ref 1.7–7.7)
Neutrophils Relative %: 62 % (ref 43–77)
PLATELETS: 211 10*3/uL (ref 150–400)
RBC: 4.33 MIL/uL (ref 3.87–5.11)
RDW: 13.4 % (ref 11.5–15.5)
WBC: 6.7 10*3/uL (ref 4.0–10.5)

## 2014-08-15 LAB — HEPATIC FUNCTION PANEL
ALBUMIN: 4.1 g/dL (ref 3.5–5.2)
ALT: 18 U/L (ref 0–35)
AST: 17 U/L (ref 0–37)
Alkaline Phosphatase: 47 U/L (ref 39–117)
BILIRUBIN TOTAL: 0.8 mg/dL (ref 0.2–1.2)
Bilirubin, Direct: 0.1 mg/dL (ref 0.0–0.3)
Indirect Bilirubin: 0.7 mg/dL (ref 0.2–1.2)
Total Protein: 6.6 g/dL (ref 6.0–8.3)

## 2014-08-15 LAB — LIPID PANEL
CHOLESTEROL: 249 mg/dL — AB (ref 0–200)
HDL: 68 mg/dL (ref >39)
LDL CALC: 167 mg/dL — AB (ref 0–99)
TRIGLYCERIDES: 72 mg/dL (ref <150)
Total CHOL/HDL Ratio: 3.7 Ratio
VLDL: 14 mg/dL (ref 0–40)

## 2014-08-15 LAB — BASIC METABOLIC PANEL WITH GFR
BUN: 18 mg/dL (ref 6–23)
CALCIUM: 9 mg/dL (ref 8.4–10.5)
CO2: 27 meq/L (ref 19–32)
Chloride: 104 mEq/L (ref 96–112)
Creat: 0.58 mg/dL (ref 0.50–1.10)
GFR, Est African American: >89 mL/min
GLUCOSE: 79 mg/dL (ref 70–99)
POTASSIUM: 3.9 meq/L (ref 3.5–5.3)
Sodium: 139 mEq/L (ref 135–145)

## 2014-08-15 LAB — MAGNESIUM: MAGNESIUM: 1.8 mg/dL (ref 1.5–2.5)

## 2014-08-15 MED ORDER — CYCLOBENZAPRINE HCL 10 MG PO TABS: ORAL_TABLET | ORAL | Status: DC

## 2014-08-15 NOTE — Patient Instructions (Signed)
What is the TMJ? The temporomandibular (tem-PUH-ro-man-DIB-yoo-ler) joint, or the TMJ, connects the upper and lower jawbones. This joint allows the jaw to open wide and move back and forth when you chew, talk, or yawn.There are also several muscles that help this joint move. There can be muscle tightness and pain in the muscle that can cause several symptoms.  What causes TMJ pain? There are many causes of TMJ pain. Repeated chewing (for example, chewing gum) and clenching your teeth can cause pain in the joint. Some TMJ pain has no obvious cause. What can I do to ease the pain? There are many things you can do to help your pain get better. When you have pain:  Eat soft foods and stay away from chewy foods (for example, taffy) Try to use both sides of your mouth to chew Don't chew gum Massage Don't open your mouth wide (for example, during yawning or singing) Don't bite your cheeks or fingernails Lower your amount of stress and worry Applying a warm, damp washcloth to the joint may help. Over-the-counter pain medicines such as ibuprofen (one brand: Advil) or acetaminophen (one brand: Tylenol) might also help. Do not use these medicines if you are allergic to them or if your doctor told you not to use them. How can I stop the pain from coming back? When your pain is better, you can do these exercises to make your muscles stronger and to keep the pain from coming back:  Resisted mouth opening: Place your thumb or two fingers under your chin and open your mouth slowly, pushing up lightly on your chin with your thumb. Hold for three to six seconds. Close your mouth slowly. Resisted mouth closing: Place your thumbs under your chin and your two index fingers on the ridge between your mouth and the bottom of your chin. Push down lightly on your chin as you close your mouth. Tongue up: Slowly open and close your mouth while keeping the tongue touching the roof of the mouth. Side-to-side jaw movement:  Place an object about one fourth of an inch thick (for example, two tongue depressors) between your front teeth. Slowly move your jaw from side to side. Increase the thickness of the object as the exercise becomes easier Forward jaw movement: Place an object about one fourth of an inch thick between your front teeth and move the bottom jaw forward so that the bottom teeth are in front of the top teeth. Increase the thickness of the object as the exercise becomes easier. These exercises should not be painful. If it hurts to do these exercises, stop doing them and talk to your family doctor.     

## 2014-08-15 NOTE — Progress Notes (Signed)
Assessment and Plan:  Hypertension: Continue medication, monitor blood pressure at home. Continue DASH diet.  Reminder to go to the ER if any CP, SOB, nausea, dizziness, severe HA, changes vision/speech, left arm numbness and tingling, and jaw pain. Cholesterol: Continue diet and exercise. Check cholesterol.  Pre-diabetes-Continue diet and exercise. Check A1C Vitamin D Def- check level and continue medications.  Increased urinary frequency- check urine and culture, check A1C, ACTH, TSH  Continue diet and meds as discussed. Further disposition pending results of labs.  HPI 58 y.o. female  presents for 3 month follow up with hypertension, hyperlipidemia, prediabetes and vitamin D. Her blood pressure has been controlled at home, today their BP is BP: 102/68 mmHg She does not workout. She denies chest pain, shortness of breath, dizziness.  She is not on cholesterol medication and denies myalgias. Her cholesterol is at goal. The cholesterol last visit was:   Lab Results  Component Value Date   CHOL 214* 03/21/2014   HDL 70 03/21/2014   LDLCALC 126* 03/21/2014   TRIG 92 03/21/2014   CHOLHDL 3.1 03/21/2014   Patient is on Vitamin D supplement.   Lab Results  Component Value Date   VD25OH 67 07/02/2013     She has been having headaches in the morning, increased thirst, and states that her urine has an abnormal odor.   Current Medications:  Current Outpatient Prescriptions on File Prior to Visit  Medication Sig Dispense Refill  . butalbital-acetaminophen-caffeine (FIORICET, ESGIC) 50-325-40 MG per tablet Take 1 tablet by mouth every 6 (six) hours as needed for headache. 30 tablet 0  . cholecalciferol (VITAMIN D) 1000 UNITS tablet Take 1,000 Units by mouth daily.    . promethazine (PHENERGAN) 25 MG tablet Take 1 tablet (25 mg total) by mouth every 6 (six) hours as needed for nausea or vomiting. For nausea associated with migraine 30 tablet 0   No current facility-administered medications on  file prior to visit.   Medical History:  Past Medical History  Diagnosis Date  . Hyperlipidemia   . Insomnia   . Migraines     HORMONAL  . Shingles outbreak 2003    RIGHT HIP  . Depression     REMISSION/ SITUATIONAL  . Hepatitis AGE 37    RESOLVED   Allergies:  Allergies  Allergen Reactions  . Citalopram Other (See Comments)    HYPERACTIVITY  . Citalopram     Hyper  . Dexamethasone Other (See Comments)    CHEST TIGHTNESS  . Dexamethasone     Chest tightness  . Red Yeast Rice [Cholestin] Other (See Comments)    headache     Review of Systems:  Review of Systems  Constitutional: Negative.  Negative for fever, chills, weight loss and malaise/fatigue.  HENT: Negative for congestion, ear discharge, ear pain, hearing loss, nosebleeds, sore throat and tinnitus.   Eyes: Negative.  Negative for blurred vision and double vision.  Respiratory: Negative.  Negative for stridor.   Cardiovascular: Negative.   Gastrointestinal: Negative.   Genitourinary: Positive for frequency. Negative for dysuria, urgency, hematuria and flank pain.  Musculoskeletal: Negative.   Skin: Negative.   Neurological: Positive for headaches. Negative for dizziness, tingling, tremors, sensory change, speech change, focal weakness, seizures and loss of consciousness.  Endo/Heme/Allergies: Positive for polydipsia. Negative for environmental allergies. Does not bruise/bleed easily.  Psychiatric/Behavioral: Negative.     Family history- Review and unchanged Social history- Review and unchanged Physical Exam: BP 102/68 mmHg  Pulse 64  Temp(Src) 98.1 F (36.7  C)  Resp 16  Ht 5\' 7"  (1.702 m)  Wt 164 lb (74.39 kg)  BMI 25.68 kg/m2 Wt Readings from Last 3 Encounters:  08/15/14 164 lb (74.39 kg)  03/21/14 159 lb (72.122 kg)  09/29/13 155 lb (70.308 kg)   General Appearance: Well nourished, in no apparent distress. Eyes: PERRLA, EOMs, conjunctiva no swelling or erythema Sinuses: No Frontal/maxillary  tenderness, + TMJ tenderness ENT/Mouth: Ext aud canals clear, TMs without erythema, bulging. No erythema, swelling, or exudate on post pharynx.  Tonsils not swollen or erythematous. Hearing normal.  Neck: Supple, thyroid normal.  Respiratory: Respiratory effort normal, BS equal bilaterally without rales, rhonchi, wheezing or stridor.  Cardio: RRR with no MRGs. Brisk peripheral pulses without edema.  Abdomen: Soft, + BS.  Non tender, no guarding, rebound, hernias, masses. Lymphatics: Non tender without lymphadenopathy.  Musculoskeletal: Full ROM, 5/5 strength, normal gait.  Skin: Warm, dry without rashes, lesions, ecchymosis.  Neuro: Cranial nerves intact. Normal muscle tone, no cerebellar symptoms. Sensation intact.  Psych: Awake and oriented X 3, normal affect, Insight and Judgment appropriate.    Vicie Mutters, PA-C 2:08 PM [facility]

## 2014-08-16 LAB — URINE CULTURE
COLONY COUNT: NO GROWTH
ORGANISM ID, BACTERIA: NO GROWTH

## 2014-08-16 LAB — URINALYSIS, ROUTINE W REFLEX MICROSCOPIC
Bilirubin Urine: NEGATIVE
Glucose, UA: NEGATIVE mg/dL
Hgb urine dipstick: NEGATIVE
Ketones, ur: NEGATIVE mg/dL
LEUKOCYTES UA: NEGATIVE
Nitrite: NEGATIVE
PH: 5.5 (ref 5.0–8.0)
Protein, ur: NEGATIVE mg/dL
SPECIFIC GRAVITY, URINE: 1.026 (ref 1.005–1.030)
UROBILINOGEN UA: 0.2 mg/dL (ref 0.0–1.0)

## 2014-08-16 LAB — HEMOGLOBIN A1C
Hgb A1c MFr Bld: 5.8 % — ABNORMAL HIGH (ref <5.7)
Mean Plasma Glucose: 120 mg/dL — ABNORMAL HIGH (ref <117)

## 2014-08-16 LAB — VITAMIN D 25 HYDROXY (VIT D DEFICIENCY, FRACTURES): VIT D 25 HYDROXY: 28 ng/mL — AB (ref 30–100)

## 2014-08-16 LAB — TSH: TSH: 1.817 u[IU]/mL (ref 0.350–4.500)

## 2014-08-17 LAB — ACTH: C206 ACTH: 9 pg/mL (ref 6–50)

## 2014-08-26 HISTORY — PX: OTHER SURGICAL HISTORY: SHX169

## 2014-09-21 ENCOUNTER — Ambulatory Visit: Payer: Self-pay | Admitting: Physician Assistant

## 2015-02-14 ENCOUNTER — Ambulatory Visit: Payer: Self-pay | Admitting: Physician Assistant

## 2015-02-22 ENCOUNTER — Ambulatory Visit (INDEPENDENT_AMBULATORY_CARE_PROVIDER_SITE_OTHER): Payer: 59 | Admitting: Physician Assistant

## 2015-02-22 VITALS — BP 122/68 | HR 64 | Temp 98.1°F | Resp 16 | Ht 67.0 in | Wt 168.0 lb

## 2015-02-22 DIAGNOSIS — E785 Hyperlipidemia, unspecified: Secondary | ICD-10-CM

## 2015-02-22 DIAGNOSIS — R030 Elevated blood-pressure reading, without diagnosis of hypertension: Secondary | ICD-10-CM

## 2015-02-22 DIAGNOSIS — Z79899 Other long term (current) drug therapy: Secondary | ICD-10-CM

## 2015-02-22 DIAGNOSIS — R7303 Prediabetes: Secondary | ICD-10-CM

## 2015-02-22 DIAGNOSIS — E559 Vitamin D deficiency, unspecified: Secondary | ICD-10-CM

## 2015-02-22 LAB — CBC WITH DIFFERENTIAL/PLATELET
BASOS PCT: 1 % (ref 0–1)
Basophils Absolute: 0.1 10*3/uL (ref 0.0–0.1)
Eosinophils Absolute: 0.1 10*3/uL (ref 0.0–0.7)
Eosinophils Relative: 1 % (ref 0–5)
HCT: 39.9 % (ref 36.0–46.0)
HEMOGLOBIN: 13.6 g/dL (ref 12.0–15.0)
Lymphocytes Relative: 28 % (ref 12–46)
Lymphs Abs: 1.8 10*3/uL (ref 0.7–4.0)
MCH: 30.6 pg (ref 26.0–34.0)
MCHC: 34.1 g/dL (ref 30.0–36.0)
MCV: 89.7 fL (ref 78.0–100.0)
MONOS PCT: 7 % (ref 3–12)
MPV: 11.1 fL (ref 8.6–12.4)
Monocytes Absolute: 0.4 10*3/uL (ref 0.1–1.0)
NEUTROS ABS: 4 10*3/uL (ref 1.7–7.7)
NEUTROS PCT: 63 % (ref 43–77)
Platelets: 226 10*3/uL (ref 150–400)
RBC: 4.45 MIL/uL (ref 3.87–5.11)
RDW: 13 % (ref 11.5–15.5)
WBC: 6.3 10*3/uL (ref 4.0–10.5)

## 2015-02-22 NOTE — Patient Instructions (Addendum)
Use a dropper or use a cap to put olive oil,mineral oil or canola oil in the effected ear- 2-3 times a week. Let it soak for 20-30 min then you can take a shower or use a baby bulb with warm water to wash out the ear wax.  Do not use Qtips  Benefiber is good for constipation/diarrhea/irritable bowel syndrome, it helps with weight loss and can help lower your bad cholesterol. Please do 1-2 TBSP in the morning in water, coffee, or tea. It can take up to a month before you can see a difference with your bowel movements. It is cheapest from [employer], [employer], [employer].      Bad carbs also include fruit juice, alcohol, and sweet tea. These are empty calories that do not signal to your brain that you are full.   Please remember the good carbs are still carbs which convert into sugar. So please measure them out no more than 1/2-1 cup of rice, oatmeal, pasta, and beans  Veggies are however free foods! Pile them on.   Not all fruit is created equal. Please see the list below, the fruit at the bottom is higher in sugars than the fruit at the top. Please avoid all dried fruits.

## 2015-02-22 NOTE — Progress Notes (Signed)
Assessment and Plan:  Hypertension: Continue medication, monitor blood pressure at home. Continue DASH diet.  Reminder to go to the ER if any CP, SOB, nausea, dizziness, severe HA, changes vision/speech, left arm numbness and tingling, and jaw pain. Cholesterol: Continue diet and exercise. Check cholesterol.  Pre-diabetes-Continue diet and exercise. Check A1C Vitamin D Def- check level and continue medications.   Continue diet and meds as discussed. Further disposition pending results of labs.  HPI 59 y.o. female  presents for 6 month follow up with hypertension, hyperlipidemia, prediabetes and vitamin D. Her blood pressure has been controlled at home, today their BP is BP: 122/68 mmHg She does not workout. She denies chest pain, shortness of breath, dizziness.  She is not on cholesterol medication and denies myalgias. Her cholesterol is not at goal. The cholesterol last visit was:   Lab Results  Component Value Date   CHOL 249* 08/15/2014   HDL 68 08/15/2014   LDLCALC 167* 08/15/2014   TRIG 72 08/15/2014   CHOLHDL 3.7 08/15/2014   Patient is on Vitamin D supplement.   Lab Results  Component Value Date   VD25OH 28* 08/15/2014     She was in the PreDM range last visit, no diabetic poly's.  Lab Results  Component Value Date   HGBA1C 5.8* 08/15/2014   She has been following with Dr. Ophelia Charter for post menopausal bleeding, found to have uterine polyp and having procedure July 21st.  Has occ headaches but the pills help. She is not on sleeping pills anymore.   Current Medications:  Current Outpatient Prescriptions on File Prior to Visit  Medication Sig Dispense Refill  . butalbital-acetaminophen-caffeine (FIORICET, ESGIC) 50-325-40 MG per tablet Take 1 tablet by mouth every 6 (six) hours as needed for headache. 30 tablet 0  . cholecalciferol (VITAMIN D) 1000 UNITS tablet Take 1,000 Units by mouth daily.    . cyclobenzaprine (FLEXERIL) 10 MG tablet 1 at night for headache/jaw pain as  needed 30 tablet 1  . promethazine (PHENERGAN) 25 MG tablet Take 1 tablet (25 mg total) by mouth every 6 (six) hours as needed for nausea or vomiting. For nausea associated with migraine 30 tablet 0   No current facility-administered medications on file prior to visit.   Medical History:  Past Medical History  Diagnosis Date  . Hyperlipidemia   . Insomnia   . Migraines     HORMONAL  . Shingles outbreak 2003    RIGHT HIP  . Depression     REMISSION/ SITUATIONAL  . Hepatitis AGE 59    RESOLVED   Allergies:  Allergies  Allergen Reactions  . Citalopram Other (See Comments)    HYPERACTIVITY  . Citalopram     Hyper  . Dexamethasone Other (See Comments)    CHEST TIGHTNESS  . Dexamethasone     Chest tightness  . Red Yeast Rice [Cholestin] Other (See Comments)    headache     Review of Systems:  Review of Systems  Constitutional: Negative.  Negative for fever, chills, weight loss and malaise/fatigue.  HENT: Negative for congestion, ear discharge, ear pain, hearing loss, nosebleeds, sore throat and tinnitus.   Eyes: Negative.  Negative for blurred vision and double vision.  Respiratory: Negative.  Negative for stridor.   Cardiovascular: Negative.   Gastrointestinal: Negative.   Genitourinary: Negative for dysuria, urgency, frequency, hematuria and flank pain.  Musculoskeletal: Negative.   Skin: Negative.   Neurological: Positive for headaches. Negative for dizziness, tingling, tremors, sensory change, speech change, focal weakness,  seizures and loss of consciousness.  Endo/Heme/Allergies: Negative for environmental allergies and polydipsia. Does not bruise/bleed easily.  Psychiatric/Behavioral: Negative.     Family history- Review and unchanged Social history- Review and unchanged Physical Exam: BP 122/68 mmHg  Pulse 64  Temp(Src) 98.1 F (36.7 C)  Resp 16  Ht 5\' 7"  (1.702 m)  Wt 168 lb (76.204 kg)  BMI 26.31 kg/m2 Wt Readings from Last 3 Encounters:  02/22/15 168  lb (76.204 kg)  08/15/14 164 lb (74.39 kg)  03/21/14 159 lb (72.122 kg)   General Appearance: Well nourished, in no apparent distress. Eyes: PERRLA, EOMs, conjunctiva no swelling or erythema Sinuses: No Frontal/maxillary tenderness, + TMJ tenderness ENT/Mouth: Ext aud canals clear, after manual removal,  TMs without erythema, bulging. No erythema, swelling, or exudate on post pharynx.  Tonsils not swollen or erythematous. Hearing normal.  Neck: Supple, thyroid normal.  Respiratory: Respiratory effort normal, BS equal bilaterally without rales, rhonchi, wheezing or stridor.  Cardio: RRR with no MRGs. Brisk peripheral pulses without edema.  Abdomen: Soft, + BS.  Non tender, no guarding, rebound, hernias, masses. Lymphatics: Non tender without lymphadenopathy.  Musculoskeletal: Full ROM, 5/5 strength, normal gait.  Skin: Warm, dry without rashes, lesions, ecchymosis.  Neuro: Cranial nerves intact. Normal muscle tone, no cerebellar symptoms. Sensation intact.  Psych: Awake and oriented X 3, normal affect, Insight and Judgment appropriate.    Vicie Mutters, PA-C 1:48 PM [facility]

## 2015-02-23 LAB — BASIC METABOLIC PANEL WITH GFR
BUN: 17 mg/dL (ref 6–23)
CO2: 30 meq/L (ref 19–32)
Calcium: 9.3 mg/dL (ref 8.4–10.5)
Chloride: 100 mEq/L (ref 96–112)
Creat: 0.65 mg/dL (ref 0.50–1.10)
GFR, Est Non African American: >89 mL/min
GLUCOSE: 84 mg/dL (ref 70–99)
Potassium: 4.6 mEq/L (ref 3.5–5.3)
Sodium: 140 mEq/L (ref 135–145)

## 2015-02-23 LAB — HEPATIC FUNCTION PANEL
ALT: 31 U/L (ref 0–35)
AST: 23 U/L (ref 0–37)
Albumin: 4.2 g/dL (ref 3.5–5.2)
Alkaline Phosphatase: 48 U/L (ref 39–117)
BILIRUBIN INDIRECT: 0.7 mg/dL (ref 0.2–1.2)
BILIRUBIN TOTAL: 0.8 mg/dL (ref 0.2–1.2)
Bilirubin, Direct: 0.1 mg/dL (ref 0.0–0.3)
Total Protein: 7 g/dL (ref 6.0–8.3)

## 2015-02-23 LAB — LIPID PANEL
CHOLESTEROL: 211 mg/dL — AB (ref 0–200)
HDL: 69 mg/dL (ref >=46)
LDL CALC: 125 mg/dL — AB (ref 0–99)
Total CHOL/HDL Ratio: 3.1 Ratio
Triglycerides: 83 mg/dL (ref <150)
VLDL: 17 mg/dL (ref 0–40)

## 2015-02-23 LAB — INSULIN, FASTING: INSULIN FASTING, SERUM: 24.5 u[IU]/mL — AB (ref 2.0–19.6)

## 2015-02-23 LAB — HEMOGLOBIN A1C
Hgb A1c MFr Bld: 5.7 % — ABNORMAL HIGH (ref <5.7)
Mean Plasma Glucose: 117 mg/dL — ABNORMAL HIGH (ref <117)

## 2015-02-23 LAB — VITAMIN D 25 HYDROXY (VIT D DEFICIENCY, FRACTURES): Vit D, 25-Hydroxy: 36 ng/mL (ref 30–100)

## 2015-02-23 LAB — MAGNESIUM: Magnesium: 1.9 mg/dL (ref 1.5–2.5)

## 2015-02-23 LAB — TSH: TSH: 1.793 u[IU]/mL (ref 0.350–4.500)

## 2015-08-24 ENCOUNTER — Encounter: Payer: Self-pay | Admitting: Physician Assistant

## 2015-08-24 ENCOUNTER — Ambulatory Visit (INDEPENDENT_AMBULATORY_CARE_PROVIDER_SITE_OTHER): Payer: 59 | Admitting: Physician Assistant

## 2015-08-24 VITALS — BP 110/60 | HR 85 | Temp 97.7°F | Ht 67.0 in | Wt 169.0 lb

## 2015-08-24 DIAGNOSIS — Z0001 Encounter for general adult medical examination with abnormal findings: Secondary | ICD-10-CM

## 2015-08-24 DIAGNOSIS — R7309 Other abnormal glucose: Secondary | ICD-10-CM | POA: Insufficient documentation

## 2015-08-24 DIAGNOSIS — G43809 Other migraine, not intractable, without status migrainosus: Secondary | ICD-10-CM

## 2015-08-24 DIAGNOSIS — M26609 Unspecified temporomandibular joint disorder, unspecified side: Secondary | ICD-10-CM

## 2015-08-24 DIAGNOSIS — E559 Vitamin D deficiency, unspecified: Secondary | ICD-10-CM

## 2015-08-24 DIAGNOSIS — Z0000 Encounter for general adult medical examination without abnormal findings: Secondary | ICD-10-CM | POA: Diagnosis not present

## 2015-08-24 DIAGNOSIS — G4700 Insomnia, unspecified: Secondary | ICD-10-CM

## 2015-08-24 DIAGNOSIS — E785 Hyperlipidemia, unspecified: Secondary | ICD-10-CM

## 2015-08-24 DIAGNOSIS — Z79899 Other long term (current) drug therapy: Secondary | ICD-10-CM

## 2015-08-24 DIAGNOSIS — R030 Elevated blood-pressure reading, without diagnosis of hypertension: Secondary | ICD-10-CM

## 2015-08-24 DIAGNOSIS — G43909 Migraine, unspecified, not intractable, without status migrainosus: Secondary | ICD-10-CM | POA: Insufficient documentation

## 2015-08-24 LAB — CBC WITH DIFFERENTIAL/PLATELET
Basophils Absolute: 0.1 10*3/uL (ref 0.0–0.1)
Basophils Relative: 1 % (ref 0–1)
EOS PCT: 1 % (ref 0–5)
Eosinophils Absolute: 0.1 10*3/uL (ref 0.0–0.7)
HEMATOCRIT: 39.3 % (ref 36.0–46.0)
HEMOGLOBIN: 13.4 g/dL (ref 12.0–15.0)
LYMPHS ABS: 1.7 10*3/uL (ref 0.7–4.0)
Lymphocytes Relative: 28 % (ref 12–46)
MCH: 30.1 pg (ref 26.0–34.0)
MCHC: 34.1 g/dL (ref 30.0–36.0)
MCV: 88.3 fL (ref 78.0–100.0)
MONO ABS: 0.5 10*3/uL (ref 0.1–1.0)
MPV: 11.3 fL (ref 8.6–12.4)
Monocytes Relative: 8 % (ref 3–12)
NEUTROS ABS: 3.8 10*3/uL (ref 1.7–7.7)
Neutrophils Relative %: 62 % (ref 43–77)
Platelets: 220 10*3/uL (ref 150–400)
RBC: 4.45 MIL/uL (ref 3.87–5.11)
RDW: 12.7 % (ref 11.5–15.5)
WBC: 6.2 10*3/uL (ref 4.0–10.5)

## 2015-08-24 LAB — HEPATIC FUNCTION PANEL
ALBUMIN: 4.2 g/dL (ref 3.6–5.1)
ALK PHOS: 52 U/L (ref 33–130)
ALT: 20 U/L (ref 6–29)
AST: 18 U/L (ref 10–35)
Bilirubin, Direct: 0.2 mg/dL (ref <=0.2)
Indirect Bilirubin: 0.8 mg/dL (ref 0.2–1.2)
Total Bilirubin: 1 mg/dL (ref 0.2–1.2)
Total Protein: 6.8 g/dL (ref 6.1–8.1)

## 2015-08-24 LAB — HEMOGLOBIN A1C
Hgb A1c MFr Bld: 5.5 % (ref <5.7)
Mean Plasma Glucose: 111 mg/dL (ref <117)

## 2015-08-24 LAB — TSH: TSH: 1.851 u[IU]/mL (ref 0.350–4.500)

## 2015-08-24 LAB — LIPID PANEL
Cholesterol: 227 mg/dL — ABNORMAL HIGH (ref 125–200)
HDL: 77 mg/dL (ref >=46)
LDL Cholesterol: 135 mg/dL — ABNORMAL HIGH (ref <130)
TRIGLYCERIDES: 75 mg/dL (ref <150)
Total CHOL/HDL Ratio: 2.9 Ratio (ref <=5.0)
VLDL: 15 mg/dL (ref <30)

## 2015-08-24 LAB — BASIC METABOLIC PANEL WITH GFR
BUN: 14 mg/dL (ref 7–25)
CO2: 29 mmol/L (ref 20–31)
Calcium: 9.1 mg/dL (ref 8.6–10.4)
Chloride: 101 mmol/L (ref 98–110)
Creat: 0.54 mg/dL (ref 0.50–1.05)
GFR, Est African American: >89 mL/min (ref >=60)
GLUCOSE: 85 mg/dL (ref 65–99)
Potassium: 4.3 mmol/L (ref 3.5–5.3)
Sodium: 138 mmol/L (ref 135–146)

## 2015-08-24 LAB — MAGNESIUM: Magnesium: 1.9 mg/dL (ref 1.5–2.5)

## 2015-08-24 MED ORDER — BUTALBITAL-APAP-CAFFEINE 50-325-40 MG PO TABS: 1.0000 | ORAL_TABLET | Freq: Four times a day (QID) | ORAL | Status: DC | PRN

## 2015-08-24 MED ORDER — PROMETHAZINE HCL 25 MG PO TABS: 25.0000 mg | ORAL_TABLET | Freq: Four times a day (QID) | ORAL | Status: DC | PRN

## 2015-08-24 MED ORDER — CYCLOBENZAPRINE HCL 10 MG PO TABS: ORAL_TABLET | ORAL | Status: DC

## 2015-08-24 NOTE — Progress Notes (Signed)
Complete Physical  Assessment and Plan: 1. TMJ (temporomandibular joint syndrome) information given to the patient, no gum/decrease hard foods, warm wet wash clothes, decrease stress, talk with dentist about possible night guard, can do massage, and exercise.   2. Hyperlipidemia -continue medications, check lipids, decrease fatty foods, increase activity.  - CBC with Differential/Platelet - BASIC METABOLIC PANEL WITH GFR - Hepatic function panel - TSH - Lipid panel - EKG 12-Lead  3. Vitamin D deficiency - VITAMIN D 25 Hydroxy (Vit-D Deficiency, Fractures)  4. Other abnormal glucose - Hemoglobin A1c  5. Insomnia - TSH  6. Medication management - Magnesium  7. Elevated blood pressure reading without diagnosis of hypertension - EKG 12-Lead  8. Encounter for general adult medical examination with abnormal findings GET MGM - CBC with Differential/Platelet - BASIC METABOLIC PANEL WITH GFR - Hepatic function panel - TSH - Lipid panel - Hemoglobin A1c - Magnesium - VITAMIN D 25 Hydroxy (Vit-D Deficiency, Fractures) - EKG 12-Lead  9. Other migraine without status migrainosus, not intractable Declines preventative medication at this time.   Discussed med's effects and SE's. Screening labs and tests as requested with regular follow-up as recommended. Over 40 minutes of exam, counseling, chart review, and complex, high level critical decision making was performed this visit.   HPI  59 y.o. female  presents for a complete physical.  Her blood pressure has been controlled at home, today their BP is BP: 110/60 mmHg She does not workout. She denies chest pain, shortness of breath, dizziness.  She has history of headaches, will take flexeril, phenergan, fiorcet PRN- has neck DJD and follows with Dr. Veronda Prude her employer is working with her. She will have dull headaches constantly, but will be bad 1-2 times a week.  She is not on cholesterol medication and denies myalgias. Her  cholesterol is at goal. The cholesterol last visit was:   Lab Results  Component Value Date   CHOL 211* 02/22/2015   HDL 69 02/22/2015   LDLCALC 125* 02/22/2015   TRIG 83 02/22/2015   CHOLHDL 3.1 02/22/2015   She has been working on diet and exercise for prediabetes, and denies paresthesia of the feet, polydipsia, polyuria and visual disturbances. Last A1C in the office was:  Lab Results  Component Value Date   HGBA1C 5.7* 02/22/2015   Patient is on Vitamin D supplement.   Lab Results  Component Value Date   VD25OH 36 02/22/2015    She recently has seen her OB/GYN, Dr. Ophelia Charter for irregular bleeding, had benign uterine polyp removed and had pap at that time, she is overdue for [facility].    Current Medications:  Current Outpatient Prescriptions on File Prior to Visit  Medication Sig Dispense Refill  . butalbital-acetaminophen-caffeine (FIORICET, ESGIC) 50-325-40 MG per tablet Take 1 tablet by mouth every 6 (six) hours as needed for headache. 30 tablet 0  . cholecalciferol (VITAMIN D) 1000 UNITS tablet Take 1,000 Units by mouth daily.    . cyclobenzaprine (FLEXERIL) 10 MG tablet 1 at night for headache/jaw pain as needed 30 tablet 1  . promethazine (PHENERGAN) 25 MG tablet Take 1 tablet (25 mg total) by mouth every 6 (six) hours as needed for nausea or vomiting. For nausea associated with migraine 30 tablet 0   No current facility-administered medications on file prior to visit.   Health Maintenance:   Immunization History  Administered Date(s) Administered  . Td 08/27/2007   Tetanus: 2009 Pneumovax: n/a Prevnar 13: n/a Flu vaccine: declines Zostavax: n/a LMP:No LMP  recorded. Patient is not currently having periods (Reason: Perimenopausal). Pap: 2016, follows Dr. Ophelia Charter MGM:01/2014 DUE  DEXA: 2011 normal Colonoscopy: 2014 EGD: n/A  Last Dental Exam: Last Eye Exam: Patient Care Team: [name], MD as PCP - General (Internal Medicine) [name], MD (Internal  Medicine)  Allergies:  Allergies  Allergen Reactions  . Citalopram Other (See Comments)    HYPERACTIVITY  . Citalopram     Hyper  . Dexamethasone Other (See Comments)    CHEST TIGHTNESS  . Dexamethasone     Chest tightness  . Red Yeast Rice [Cholestin] Other (See Comments)    headache   Medical History:  Past Medical History  Diagnosis Date  . Hyperlipidemia   . Insomnia   . Migraines     HORMONAL  . Shingles outbreak 2003    RIGHT HIP  . Depression     REMISSION/ SITUATIONAL  . Hepatitis AGE 27    RESOLVED   Surgical History:  Past Surgical History  Procedure Laterality Date  . Knee arthroscopy Right 2002  . Tubal ligation     Family History:  Family History  Problem Relation Age of Onset  . Dementia Mother     Delman Kitten  . Cancer Father 40    MELANOMA CAUSED DEATH  . Cancer Sister 11    BREAST DECEASED AT 19  . Stroke Maternal Grandmother   . Cancer Father     melanoma  . Cancer Sister 27    breast- deceased  . Stroke Maternal Grandmother 80  . Heart disease Maternal Grandfather   . Cancer Paternal Grandfather     COLON  . Cancer Brother     multiple myeloma/lung  . Depression Brother    Social History:  Social History  Substance Use Topics  . Smoking status: Former Smoker    Quit date: 08/26/2008  . Smokeless tobacco: None  . Alcohol Use: No     Comment: OCCASIONAL    Review of Systems: Review of Systems  Constitutional: Negative.  Negative for fever, chills, weight loss and malaise/fatigue.  HENT: Negative for congestion, ear discharge, ear pain, hearing loss, nosebleeds, sore throat and tinnitus.   Eyes: Negative.  Negative for blurred vision and double vision.  Respiratory: Negative.  Negative for stridor.   Cardiovascular: Negative.   Gastrointestinal: Negative.   Genitourinary: Negative for dysuria, urgency, frequency, hematuria and flank pain.  Musculoskeletal: Negative.   Skin: Negative.   Neurological: Positive for headaches.  Negative for dizziness, tingling, tremors, sensory change, speech change, focal weakness, seizures and loss of consciousness.  Endo/Heme/Allergies: Negative for environmental allergies and polydipsia. Does not bruise/bleed easily.  Psychiatric/Behavioral: Negative.     Physical Exam: Estimated body mass index is 26.46 kg/(m^2) as calculated from the following:   Height as of this encounter: '5\' 7"'$  (1.702 m).   Weight as of this encounter: 169 lb (76.658 kg). BP 110/60 mmHg  Pulse 85  Temp(Src) 97.7 F (36.5 C) (Temporal)  Ht '5\' 7"'$  (1.702 m)  Wt 169 lb (76.658 kg)  BMI 26.46 kg/m2  SpO2 93% General Appearance: Well nourished, in no apparent distress.  Eyes: PERRLA, EOMs, conjunctiva no swelling or erythema, normal fundi and vessels.  Sinuses: No Frontal/maxillary tenderness  ENT/Mouth: Ext aud canals clear, normal light reflex with TMs without erythema, bulging. Good dentition. No erythema, swelling, or exudate on post pharynx. Tonsils not swollen or erythematous. Hearing normal.  Neck: Supple, thyroid normal. No bruits  Respiratory: Respiratory effort normal, BS equal bilaterally without rales,  rhonchi, wheezing or stridor.  Cardio: RRR without murmurs, rubs or gallops. Brisk peripheral pulses without edema.  Chest: symmetric, with normal excursions and percussion.  Breasts:defer Abdomen: Soft, nontender, no guarding, rebound, hernias, masses, or organomegaly.  Lymphatics: Non tender without lymphadenopathy.  Genitourinary: defer Musculoskeletal: Full ROM all peripheral extremities,5/5 strength, and normal gait.  Skin: Warm, dry without rashes, lesions, ecchymosis. Neuro: Cranial nerves intact, reflexes equal bilaterally. Normal muscle tone, no cerebellar symptoms. Sensation intact.  Psych: Awake and oriented X 3, normal affect, Insight and Judgment appropriate.   EKG: WNL, IRBBB, no ST changes AORTA SCAN: N/A  Vicie Mutters 8:38 AM [facility]

## 2015-08-24 NOTE — Patient Instructions (Signed)
Let me know if you want to try preventative medication for your migraines.  We could try either topamax or amitriptyline at night, low doses  Get your [facility] Get Chest xray at your work  Preventive Care for Adults  A healthy lifestyle and preventive care can promote health and wellness. Preventive health guidelines for women include the following key practices.  A routine yearly physical is a good way to check with your health care provider about your health and preventive screening. It is a chance to share any concerns and updates on your health and to receive a thorough exam.  Visit your dentist for a routine exam and preventive care every 6 months. Brush your teeth twice a day and floss once a day. Good oral hygiene prevents tooth decay and gum disease.  The frequency of eye exams is based on your age, health, family medical history, use of contact lenses, and other factors. Follow your health care provider's recommendations for frequency of eye exams.  Eat a healthy diet. Foods like vegetables, fruits, whole grains, low-fat dairy products, and lean protein foods contain the nutrients you need without too many calories. Decrease your intake of foods high in solid fats, added sugars, and salt. Eat the right amount of calories for you.Get information about a proper diet from your health care provider, if necessary.  Regular physical exercise is one of the most important things you can do for your health. Most adults should get at least 150 minutes of moderate-intensity exercise (any activity that increases your heart rate and causes you to sweat) each week. In addition, most adults need muscle-strengthening exercises on 2 or more days a week.  Maintain a healthy weight. The body mass index (BMI) is a screening tool to identify possible weight problems. It provides an estimate of body fat based on height and weight. Your health care provider can find your BMI and can help you achieve or maintain a  healthy weight.For adults 20 years and older:  A BMI below 18.5 is considered underweight.  A BMI of 18.5 to 24.9 is normal.  A BMI of 25 to 29.9 is considered overweight.  A BMI of 30 and above is considered obese.  Maintain normal blood lipids and cholesterol levels by exercising and minimizing your intake of saturated fat. Eat a balanced diet with plenty of fruit and vegetables. Blood tests for lipids and cholesterol should begin at age 22 and be repeated every 5 years. If your lipid or cholesterol levels are high, you are over 50, or you are at high risk for heart disease, you may need your cholesterol levels checked more frequently.Ongoing high lipid and cholesterol levels should be treated with medicines if diet and exercise are not working.  If you smoke, find out from your health care provider how to quit. If you do not use tobacco, do not start.  Lung cancer screening is recommended for adults aged 67-80 years who are at high risk for developing lung cancer because of a history of smoking. A yearly low-dose CT scan of the lungs is recommended for people who have at least a 30-pack-year history of smoking and are a current smoker or have quit within the past 15 years. A pack year of smoking is smoking an average of 1 pack of cigarettes a day for 1 year (for example: 1 pack a day for 30 years or 2 packs a day for 15 years). Yearly screening should continue until the smoker has stopped smoking for at  least 15 years. Yearly screening should be stopped for people who develop a health problem that would prevent them from having lung cancer treatment.  High blood pressure causes heart disease and increases the risk of stroke. Your blood pressure should be checked at least every 1 to 2 years. Ongoing high blood pressure should be treated with medicines if weight loss and exercise do not work.  If you are 62-36 years old, ask your health care provider if you should take aspirin to prevent  strokes.  Diabetes screening involves taking a blood sample to check your fasting blood sugar level. This should be done once every 3 years, after age 48, if you are within normal weight and without risk factors for diabetes. Testing should be considered at a younger age or be carried out more frequently if you are overweight and have at least 1 risk factor for diabetes.  Breast cancer screening is essential preventive care for women. You should practice "breast self-awareness." This means understanding the normal appearance and feel of your breasts and may include breast self-examination. Any changes detected, no matter how small, should be reported to a health care provider. Women in their 54s and 30s should have a clinical breast exam (CBE) by a health care provider as part of a regular health exam every 1 to 3 years. After age 31, women should have a CBE every year. Starting at age 67, women should consider having a mammogram (breast X-ray test) every year. Women who have a family history of breast cancer should talk to their health care provider about genetic screening. Women at a high risk of breast cancer should talk to their health care providers about having an MRI and a mammogram every year.  Breast cancer gene (BRCA)-related cancer risk assessment is recommended for women who have family members with BRCA-related cancers. BRCA-related cancers include breast, ovarian, tubal, and peritoneal cancers. Having family members with these cancers may be associated with an increased risk for harmful changes (mutations) in the breast cancer genes BRCA1 and BRCA2. Results of the assessment will determine the need for genetic counseling and BRCA1 and BRCA2 testing.  Routine pelvic exams to screen for cancer are no longer recommended for nonpregnant women who are considered low risk for cancer of the pelvic organs (ovaries, uterus, and vagina) and who do not have symptoms. Ask your health care provider if a  screening pelvic exam is right for you.  If you have had past treatment for cervical cancer or a condition that could lead to cancer, you need Pap tests and screening for cancer for at least 20 years after your treatment. If Pap tests have been discontinued, your risk factors (such as having a new sexual partner) need to be reassessed to determine if screening should be resumed. Some women have medical problems that increase the chance of getting cervical cancer. In these cases, your health care provider may recommend more frequent screening and Pap tests.  Colorectal cancer can be detected and often prevented. Most routine colorectal cancer screening begins at the age of 30 years and continues through age 42 years. However, your health care provider may recommend screening at an earlier age if you have risk factors for colon cancer. On a yearly basis, your health care provider may provide home test kits to check for hidden blood in the stool. Use of a small camera at the end of a tube, to directly examine the colon (sigmoidoscopy or colonoscopy), can detect the earliest forms of colorectal  cancer. Talk to your health care provider about this at age 61, when routine screening begins. Direct exam of the colon should be repeated every 5-10 years through age 28 years, unless early forms of pre-cancerous polyps or small growths are found.  Hepatitis C blood testing is recommended for all people born from [birth year] through 1965 and any individual with known risks for hepatitis C.  Pra  Osteoporosis is a disease in which the bones lose minerals and strength with aging. This can result in serious bone fractures or breaks. The risk of osteoporosis can be identified using a bone density scan. Women ages 21 years and over and women at risk for fractures or osteoporosis should discuss screening with their health care providers. Ask your health care provider whether you should take a calcium supplement or vitamin D to  reduce the rate of osteoporosis.  Menopause can be associated with physical symptoms and risks. Hormone replacement therapy is available to decrease symptoms and risks. You should talk to your health care provider about whether hormone replacement therapy is right for you.  Use sunscreen. Apply sunscreen liberally and repeatedly throughout the day. You should seek shade when your shadow is shorter than you. Protect yourself by wearing long sleeves, pants, a wide-brimmed hat, and sunglasses year round, whenever you are outdoors.  Once a month, do a whole body skin exam, using a mirror to look at the skin on your back. Tell your health care provider of new moles, moles that have irregular borders, moles that are larger than a pencil eraser, or moles that have changed in shape or color.  Stay current with required vaccines (immunizations).  Influenza vaccine. All adults should be immunized every year.  Tetanus, diphtheria, and acellular pertussis (Td, Tdap) vaccine. Pregnant women should receive 1 dose of Tdap vaccine during each pregnancy. The dose should be obtained regardless of the length of time since the last dose. Immunization is preferred during the 27th-36th week of gestation. An adult who has not previously received Tdap or who does not know her vaccine status should receive 1 dose of Tdap. This initial dose should be followed by tetanus and diphtheria toxoids (Td) booster doses every 10 years. Adults with an unknown or incomplete history of completing a 3-dose immunization series with Td-containing vaccines should begin or complete a primary immunization series including a Tdap dose. Adults should receive a Td booster every 10 years.  Varicella vaccine. An adult without evidence of immunity to varicella should receive 2 doses or a second dose if she has previously received 1 dose. Pregnant females who do not have evidence of immunity should receive the first dose after pregnancy. This first  dose should be obtained before leaving the health care facility. The second dose should be obtained 4-8 weeks after the first dose.  Human papillomavirus (HPV) vaccine. Females aged 13-26 years who have not received the vaccine previously should obtain the 3-dose series. The vaccine is not recommended for use in pregnant females. However, pregnancy testing is not needed before receiving a dose. If a female is found to be pregnant after receiving a dose, no treatment is needed. In that case, the remaining doses should be delayed until after the pregnancy. Immunization is recommended for any person with an immunocompromised condition through the age of 83 years if she did not get any or all doses earlier. During the 3-dose series, the second dose should be obtained 4-8 weeks after the first dose. The third dose should be obtained 24  weeks after the first dose and 16 weeks after the second dose.  Zoster vaccine. One dose is recommended for adults aged 42 years or older unless certain conditions are present.  Measles, mumps, and rubella (MMR) vaccine. Adults born before 33 generally are considered immune to measles and mumps. Adults born in [birth year] or later should have 1 or more doses of MMR vaccine unless there is a contraindication to the vaccine or there is laboratory evidence of immunity to each of the three diseases. A routine second dose of MMR vaccine should be obtained at least 28 days after the first dose for students attending postsecondary schools, health care workers, or international travelers. People who received inactivated measles vaccine or an unknown type of measles vaccine during 1963-1967 should receive 2 doses of MMR vaccine. People who received inactivated mumps vaccine or an unknown type of mumps vaccine before 1979 and are at high risk for mumps infection should consider immunization with 2 doses of MMR vaccine. For females of childbearing age, rubella immunity should be determined. If there  is no evidence of immunity, females who are not pregnant should be vaccinated. If there is no evidence of immunity, females who are pregnant should delay immunization until after pregnancy. Unvaccinated health care workers born before 50 who lack laboratory evidence of measles, mumps, or rubella immunity or laboratory confirmation of disease should consider measles and mumps immunization with 2 doses of MMR vaccine or rubella immunization with 1 dose of MMR vaccine.  Pneumococcal 13-valent conjugate (PCV13) vaccine. When indicated, a person who is uncertain of her immunization history and has no record of immunization should receive the PCV13 vaccine. An adult aged 46 years or older who has certain medical conditions and has not been previously immunized should receive 1 dose of PCV13 vaccine. This PCV13 should be followed with a dose of pneumococcal polysaccharide (PPSV23) vaccine. The PPSV23 vaccine dose should be obtained at least 8 weeks after the dose of PCV13 vaccine. An adult aged 71 years or older who has certain medical conditions and previously received 1 or more doses of PPSV23 vaccine should receive 1 dose of PCV13. The PCV13 vaccine dose should be obtained 1 or more years after the last PPSV23 vaccine dose.    Pneumococcal polysaccharide (PPSV23) vaccine. When PCV13 is also indicated, PCV13 should be obtained first. All adults aged 79 years and older should be immunized. An adult younger than age 79 years who has certain medical conditions should be immunized. Any person who resides in a nursing home or long-term care facility should be immunized. An adult smoker should be immunized. People with an immunocompromised condition and certain other conditions should receive both PCV13 and PPSV23 vaccines. People with human immunodeficiency virus (HIV) infection should be immunized as soon as possible after diagnosis. Immunization during chemotherapy or radiation therapy should be avoided. Routine use  of PPSV23 vaccine is not recommended for American Indians, [place] Natives, or people younger than 65 years unless there are medical conditions that require PPSV23 vaccine. When indicated, people who have unknown immunization and have no record of immunization should receive PPSV23 vaccine. One-time revaccination 5 years after the first dose of PPSV23 is recommended for people aged 19-64 years who have chronic kidney failure, nephrotic syndrome, asplenia, or immunocompromised conditions. People who received 1-2 doses of PPSV23 before age 55 years should receive another dose of PPSV23 vaccine at age 70 years or later if at least 5 years have passed since the previous dose. Doses of PPSV23 are  not needed for people immunized with PPSV23 at or after age 59 years.  Preventive Services / Frequency   Ages 89 to 71 years  Blood pressure check.  Lipid and cholesterol check.  Lung cancer screening. / Every year if you are aged 85-80 years and have a 30-pack-year history of smoking and currently smoke or have quit within the past 15 years. Yearly screening is stopped once you have quit smoking for at least 15 years or develop a health problem that would prevent you from having lung cancer treatment.  Clinical breast exam.** / Every year after age 72 years.  BRCA-related cancer risk assessment.** / For women who have family members with a BRCA-related cancer (breast, ovarian, tubal, or peritoneal cancers).  Mammogram.** / Every year beginning at age 49 years and continuing for as long as you are in good health. Consult with your health care provider.  Pap test.** / Every 3 years starting at age 53 years through age 38 or 38 years with a history of 3 consecutive normal Pap tests.  HPV screening.** / Every 3 years from ages 63 years through ages 45 to 54 years with a history of 3 consecutive normal Pap tests.  Fecal occult blood test (FOBT) of stool. / Every year beginning at age 53 years and continuing  until age 56 years. You may not need to do this test if you get a colonoscopy every 10 years.  Flexible sigmoidoscopy or colonoscopy.** / Every 5 years for a flexible sigmoidoscopy or every 10 years for a colonoscopy beginning at age 39 years and continuing until age 60 years.  Hepatitis C blood test.** / For all people born from [birth year] through 1965 and any individual with known risks for hepatitis C.  Skin self-exam. / Monthly.  Influenza vaccine. / Every year.  Tetanus, diphtheria, and acellular pertussis (Tdap/Td) vaccine.** / Consult your health care provider. Pregnant women should receive 1 dose of Tdap vaccine during each pregnancy. 1 dose of Td every 10 years.  Varicella vaccine.** / Consult your health care provider. Pregnant females who do not have evidence of immunity should receive the first dose after pregnancy.  Zoster vaccine.** / 1 dose for adults aged 48 years or older.  Pneumococcal 13-valent conjugate (PCV13) vaccine.** / Consult your health care provider.  Pneumococcal polysaccharide (PPSV23) vaccine.** / 1 to 2 doses if you smoke cigarettes or if you have certain conditions.  Meningococcal vaccine.** / Consult your health care provider.  Hepatitis A vaccine.** / Consult your health care provider.  Hepatitis B vaccine.** / Consult your health care provider. Screening for abdominal aortic aneurysm (AAA)  by ultrasound is recommended for people over 50 who have history of high blood pressure or who are current or former smokers.

## 2015-08-25 LAB — VITAMIN D 25 HYDROXY (VIT D DEFICIENCY, FRACTURES): VIT D 25 HYDROXY: 32 ng/mL (ref 30–100)

## 2015-08-30 ENCOUNTER — Telehealth: Payer: Self-pay | Admitting: Internal Medicine

## 2015-08-30 MED ORDER — PREDNISONE 20 MG PO TABS: ORAL_TABLET | ORAL | Status: DC

## 2015-08-30 NOTE — Telephone Encounter (Signed)
patient does not want to take pain medicine, requests predinsone. Also, What structure are you wanting imaged on chest xray, to be done at her office? [facility] [place]

## 2015-08-30 NOTE — Addendum Note (Signed)
Addended by: Vicie Mutters R on: 08/30/2015 04:51 PM   Modules accepted: Orders

## 2016-03-13 ENCOUNTER — Encounter: Payer: Self-pay | Admitting: Physician Assistant

## 2016-03-20 ENCOUNTER — Ambulatory Visit (INDEPENDENT_AMBULATORY_CARE_PROVIDER_SITE_OTHER): Payer: BLUE CROSS/BLUE SHIELD | Admitting: Internal Medicine

## 2016-03-20 ENCOUNTER — Encounter: Payer: Self-pay | Admitting: Internal Medicine

## 2016-03-20 VITALS — BP 108/62 | HR 68 | Temp 98.2°F | Resp 16 | Ht 67.0 in | Wt 165.0 lb

## 2016-03-20 DIAGNOSIS — E559 Vitamin D deficiency, unspecified: Secondary | ICD-10-CM | POA: Diagnosis not present

## 2016-03-20 DIAGNOSIS — E785 Hyperlipidemia, unspecified: Secondary | ICD-10-CM

## 2016-03-20 DIAGNOSIS — Z79899 Other long term (current) drug therapy: Secondary | ICD-10-CM | POA: Diagnosis not present

## 2016-03-20 DIAGNOSIS — Z0000 Encounter for general adult medical examination without abnormal findings: Secondary | ICD-10-CM

## 2016-03-20 DIAGNOSIS — Z130 Encounter for screening for diseases of the blood and blood-forming organs and certain disorders involving the immune mechanism: Secondary | ICD-10-CM

## 2016-03-20 DIAGNOSIS — Z131 Encounter for screening for diabetes mellitus: Secondary | ICD-10-CM

## 2016-03-20 DIAGNOSIS — Z1389 Encounter for screening for other disorder: Secondary | ICD-10-CM

## 2016-03-20 DIAGNOSIS — Z1329 Encounter for screening for other suspected endocrine disorder: Secondary | ICD-10-CM

## 2016-03-20 LAB — CBC WITH DIFFERENTIAL/PLATELET
BASOS PCT: 1 %
Basophils Absolute: 47 cells/uL (ref 0–200)
EOS PCT: 1 %
Eosinophils Absolute: 47 cells/uL (ref 15–500)
HCT: 41.8 % (ref 35.0–45.0)
Hemoglobin: 14 g/dL (ref 11.7–15.5)
LYMPHS PCT: 29 %
Lymphs Abs: 1363 cells/uL (ref 850–3900)
MCH: 29.7 pg (ref 27.0–33.0)
MCHC: 33.5 g/dL (ref 32.0–36.0)
MCV: 88.7 fL (ref 80.0–100.0)
MONOS PCT: 6 %
MPV: 10.8 fL (ref 7.5–12.5)
Monocytes Absolute: 282 cells/uL (ref 200–950)
NEUTROS ABS: 2961 {cells}/uL (ref 1500–7800)
Neutrophils Relative %: 63 %
PLATELETS: 221 10*3/uL (ref 140–400)
RBC: 4.71 MIL/uL (ref 3.80–5.10)
RDW: 13.2 % (ref 11.0–15.0)
WBC: 4.7 10*3/uL (ref 3.8–10.8)

## 2016-03-20 LAB — BASIC METABOLIC PANEL WITH GFR
BUN: 16 mg/dL (ref 7–25)
CHLORIDE: 104 mmol/L (ref 98–110)
CO2: 27 mmol/L (ref 20–31)
CREATININE: 0.63 mg/dL (ref 0.50–0.99)
Calcium: 9.3 mg/dL (ref 8.6–10.4)
GFR, Est Non African American: >89 mL/min (ref >=60)
Glucose, Bld: 89 mg/dL (ref 65–99)
POTASSIUM: 4.3 mmol/L (ref 3.5–5.3)
SODIUM: 140 mmol/L (ref 135–146)

## 2016-03-20 LAB — IRON AND TIBC
%SAT: 33 % (ref 11–50)
IRON: 93 ug/dL (ref 45–160)
TIBC: 283 ug/dL (ref 250–450)
UIBC: 190 ug/dL (ref 125–400)

## 2016-03-20 LAB — HEPATIC FUNCTION PANEL
ALBUMIN: 4.4 g/dL (ref 3.6–5.1)
ALK PHOS: 62 U/L (ref 33–130)
ALT: 15 U/L (ref 6–29)
AST: 16 U/L (ref 10–35)
BILIRUBIN DIRECT: 0.2 mg/dL (ref <=0.2)
BILIRUBIN TOTAL: 0.9 mg/dL (ref 0.2–1.2)
Indirect Bilirubin: 0.7 mg/dL (ref 0.2–1.2)
Total Protein: 7.2 g/dL (ref 6.1–8.1)

## 2016-03-20 LAB — LIPID PANEL
CHOL/HDL RATIO: 3.1 ratio (ref <=5.0)
Cholesterol: 223 mg/dL — ABNORMAL HIGH (ref 125–200)
HDL: 73 mg/dL (ref >=46)
LDL Cholesterol: 135 mg/dL — ABNORMAL HIGH (ref <130)
TRIGLYCERIDES: 77 mg/dL (ref <150)
VLDL: 15 mg/dL (ref <30)

## 2016-03-20 LAB — MAGNESIUM: MAGNESIUM: 2.1 mg/dL (ref 1.5–2.5)

## 2016-03-20 LAB — HEMOGLOBIN A1C
HEMOGLOBIN A1C: 5.5 % (ref <5.7)
Mean Plasma Glucose: 111 mg/dL

## 2016-03-20 LAB — VITAMIN B12: VITAMIN B 12: 444 pg/mL (ref 200–1100)

## 2016-03-20 LAB — TSH: TSH: 1.61 mIU/L

## 2016-03-20 NOTE — Progress Notes (Signed)
Patient ID: Rebecca [name], female   DOB: 1956-06-22, 60 y.o.   MRN: [id number]  Complete Physical  Assessment and Plan:   1. Routine general medical examination at a health care facility  - CBC with Differential/Platelet - BASIC METABOLIC PANEL WITH GFR - Hepatic function panel - Magnesium  2. Hyperlipidemia  - Lipid panel  3. Screening for diabetes mellitus  - Hemoglobin A1c - Insulin, random  4. Screening for thyroid disorder - TSH  5. Screening for hematuria or proteinuria  - Urinalysis, Routine w reflex microscopic (not at [facility]) - Microalbumin / creatinine urine ratio  6. Screening for deficiency anemia  - Iron and TIBC - Vitamin B12  7. Vitamin D deficiency -not on supplementation due to renal stones - VITAMIN D 25 Hydroxy (Vit-D Deficiency, Fractures)   Discussed med's effects and SE's. Screening labs and tests as requested with regular follow-up as recommended.  HPI  60 y.o. female  presents for a complete physical.  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    Her blood pressure has been controlled at home, today their BP is BP: 108/62.  She does workout. She denies chest pain, shortness of breath, dizziness.   She is on cholesterol medication and denies myalgias. Her cholesterol is at goal. The cholesterol last visit was:  Lab Results  Component Value Date   CHOL 227 (H) 08/24/2015   HDL 77 08/24/2015   LDLCALC 135 (H) 08/24/2015   TRIG 75 08/24/2015   CHOLHDL 2.9 08/24/2015  .  She has been working on diet and exercise for prediabetes, she is not on bASA, she is not on ACE/ARB and denies foot ulcerations, hyperglycemia, hypoglycemia , increased appetite, nausea, paresthesia of the feet, polydipsia, polyuria, visual disturbances, vomiting and weight loss. Last A1C in the office was:  Lab Results  Component Value Date   HGBA1C 5.5 08/24/2015    Patient is on Vitamin D supplement.   Lab Results  Component Value Date   VD25OH 32  08/24/2015     She is seeing Obgyn. She did see them in May.  They are taking care of her mammograms.  She reports that she did have a polyp found in her uterus.  She reports that they are monitoring her.  They did do a pap smear this year.    Current Medications:  No current outpatient prescriptions on file prior to visit.   No current facility-administered medications on file prior to visit.     Health Maintenance:   Immunization History  Administered Date(s) Administered  . Td 08/27/2007    Tetanus: 2009 Flu vaccine: Pap: Jan 18 2016 MGM: Jan 18 2016 DEXA: Dr. Ophelia Charter Colonoscopy: 2014  Last Eye Exam: Eye doctor  Patient Care Team: [name], MD as PCP - General (Internal Medicine) [name], MD (Internal Medicine) Arta Silence, MD as Consulting Physician (Gastroenterology) Arvella Nigh, MD as Consulting Physician (Obstetrics and Gynecology)  Allergies:  Allergies  Allergen Reactions  . Citalopram Other (See Comments)    HYPERACTIVITY  . Citalopram     Hyper  . Dexamethasone Other (See Comments)    CHEST TIGHTNESS  . Dexamethasone     Chest tightness  . Red Yeast Rice [Cholestin] Other (See Comments)    headache    Medical History:  Past Medical History:  Diagnosis Date  . Depression    REMISSION/ SITUATIONAL  . Hepatitis AGE 76   RESOLVED  . Hyperlipidemia   . Insomnia   . Migraines  HORMONAL  . Shingles outbreak 2003   RIGHT HIP    Surgical History:  Past Surgical History:  Procedure Laterality Date  . KNEE ARTHROSCOPY Right 2002  . TUBAL LIGATION      Family History:  Family History  Problem Relation Age of Onset  . Dementia Mother     Delman Kitten  . Cancer Father 26    MELANOMA CAUSED DEATH  . Cancer Sister 84    BREAST DECEASED AT 64  . Stroke Maternal Grandmother   . Cancer Father     melanoma  . Cancer Sister 37    breast- deceased  . Stroke Maternal Grandmother 80  . Heart disease Maternal Grandfather   .  Cancer Paternal Grandfather     COLON  . Cancer Brother     multiple myeloma/lung  . Depression Brother     Social History:  Social History  Substance Use Topics  . Smoking status: Former Smoker    Quit date: 08/26/2008  . Smokeless tobacco: Not on file  . Alcohol use No     Comment: OCCASIONAL    Review of Systems: Review of Systems  Constitutional: Negative for chills, fever and malaise/fatigue.  HENT: Negative for congestion, ear pain and sore throat.   Eyes: Negative.   Respiratory: Negative for cough, shortness of breath and wheezing.   Cardiovascular: Negative for chest pain, palpitations and leg swelling.  Gastrointestinal: Negative for abdominal pain, blood in stool, constipation, diarrhea, heartburn and melena.  Genitourinary: Negative.   Skin: Negative.   Neurological: Negative for dizziness, sensory change, loss of consciousness and headaches.  Psychiatric/Behavioral: Negative for depression. The patient is not nervous/anxious and does not have insomnia.     Physical Exam: Estimated body mass index is 25.84 kg/m as calculated from the following:   Height as of this encounter: _0  (1.702 m).   Weight as of this encounter: 165 lb (74.8 kg). BP 108/62   Pulse 68   Temp 98.2 F (36.8 C) (Temporal)   Resp 16   Ht _1  (1.702 m)   Wt 165 lb (74.8 kg)   BMI 25.84 kg/m   General Appearance: Well nourished well developed, in no apparent distress.  Eyes: PERRLA, EOMs, conjunctiva no swelling or erythema ENT/Mouth: Ear canals normal without obstruction, swelling, erythema, or discharge.  TMs normal bilaterally with no erythema, bulging, retraction, or loss of landmark.  Oropharynx moist and clear with no exudate, erythema, or swelling.   Neck: Supple, thyroid normal. No bruits.  No cervical adenopathy Respiratory: Respiratory effort normal, Breath sounds clear A&P without wheeze, rhonchi, rales.   Cardio: RRR without murmurs, rubs or gallops. Brisk peripheral  pulses without edema.  Chest: symmetric, with normal excursions Abdomen: Soft, nontender, no guarding, rebound, hernias, masses, or organomegaly.  Lymphatics: Non tender without lymphadenopathy.  Musculoskeletal: Full ROM all peripheral extremities,5/5 strength, and normal gait.  Skin: Warm, dry without rashes, lesions, ecchymosis. Neuro: Awake and oriented X 3, Cranial nerves intact, reflexes equal bilaterally. Normal muscle tone, no cerebellar symptoms. Sensation intact.  Psych:  normal affect, Insight and Judgment appropriate.   Over 40 minutes of exam, counseling, chart review and critical decision making was performed  Starlyn Skeans 9:18 AM [facility]

## 2016-03-21 LAB — URINALYSIS, ROUTINE W REFLEX MICROSCOPIC
Bilirubin Urine: NEGATIVE
Glucose, UA: NEGATIVE
Hgb urine dipstick: NEGATIVE
Ketones, ur: NEGATIVE
LEUKOCYTES UA: NEGATIVE
Nitrite: NEGATIVE
PROTEIN: NEGATIVE
Specific Gravity, Urine: 1.018 (ref 1.001–1.035)
pH: 7 (ref 5.0–8.0)

## 2016-03-21 LAB — MICROALBUMIN / CREATININE URINE RATIO
CREATININE, URINE: 120 mg/dL (ref 20–320)
MICROALB/CREAT RATIO: 11 ug/mg{creat} (ref <30)
Microalb, Ur: 1.3 mg/dL

## 2016-03-21 LAB — VITAMIN D 25 HYDROXY (VIT D DEFICIENCY, FRACTURES): VIT D 25 HYDROXY: 33 ng/mL (ref 30–100)

## 2016-03-21 LAB — INSULIN, RANDOM: Insulin: 4.3 u[IU]/mL (ref 2.0–19.6)

## 2016-10-08 ENCOUNTER — Encounter: Payer: Self-pay | Admitting: Physician Assistant

## 2016-10-08 ENCOUNTER — Ambulatory Visit (INDEPENDENT_AMBULATORY_CARE_PROVIDER_SITE_OTHER): Payer: BLUE CROSS/BLUE SHIELD | Admitting: Physician Assistant

## 2016-10-08 VITALS — BP 126/80 | HR 65 | Temp 97.6°F | Resp 16 | Ht 67.0 in | Wt 167.8 lb

## 2016-10-08 DIAGNOSIS — H9203 Otalgia, bilateral: Secondary | ICD-10-CM | POA: Diagnosis not present

## 2016-10-08 DIAGNOSIS — R112 Nausea with vomiting, unspecified: Secondary | ICD-10-CM

## 2016-10-08 DIAGNOSIS — H6123 Impacted cerumen, bilateral: Secondary | ICD-10-CM | POA: Diagnosis not present

## 2016-10-08 LAB — COMPREHENSIVE METABOLIC PANEL
ALBUMIN: 4.3 g/dL (ref 3.6–5.1)
ALK PHOS: 58 U/L (ref 33–130)
ALT: 15 U/L (ref 6–29)
AST: 17 U/L (ref 10–35)
BILIRUBIN TOTAL: 0.6 mg/dL (ref 0.2–1.2)
BUN: 16 mg/dL (ref 7–25)
CALCIUM: 8.9 mg/dL (ref 8.6–10.4)
CO2: 28 mmol/L (ref 20–31)
CREATININE: 0.64 mg/dL (ref 0.50–0.99)
Chloride: 104 mmol/L (ref 98–110)
GLUCOSE: 86 mg/dL (ref 65–99)
Potassium: 4.2 mmol/L (ref 3.5–5.3)
Sodium: 141 mmol/L (ref 135–146)
Total Protein: 7 g/dL (ref 6.1–8.1)

## 2016-10-08 LAB — CBC WITH DIFFERENTIAL/PLATELET
BASOS PCT: 1 %
Basophils Absolute: 41 cells/uL (ref 0–200)
Eosinophils Absolute: 0 cells/uL — ABNORMAL LOW (ref 15–500)
Eosinophils Relative: 0 %
HEMATOCRIT: 40.8 % (ref 35.0–45.0)
HEMOGLOBIN: 13.5 g/dL (ref 11.7–15.5)
LYMPHS ABS: 1558 {cells}/uL (ref 850–3900)
Lymphocytes Relative: 38 %
MCH: 29.7 pg (ref 27.0–33.0)
MCHC: 33.1 g/dL (ref 32.0–36.0)
MCV: 89.9 fL (ref 80.0–100.0)
MONO ABS: 287 {cells}/uL (ref 200–950)
MPV: 11.7 fL (ref 7.5–12.5)
Monocytes Relative: 7 %
NEUTROS ABS: 2214 {cells}/uL (ref 1500–7800)
Neutrophils Relative %: 54 %
Platelets: 169 10*3/uL (ref 140–400)
RBC: 4.54 MIL/uL (ref 3.80–5.10)
RDW: 13.1 % (ref 11.0–15.0)
WBC: 4.1 10*3/uL (ref 3.8–10.8)

## 2016-10-08 NOTE — Progress Notes (Signed)
   Subjective:    Patient ID: Rebecca [name], female    DOB: 1956-08-16, 61 y.o.   MRN: [id number]  HPI 61 y.o. [place] Sunday was cleaning tub with tilex, got headache and sore throat, had HA all day Monday, then had migraine with vomiting x 5 hours, felt better Tuesday except Wednesday had ST, runny nose and low grade temp on Saturday with tingling in her back. She still feels tingling in her back and feel fullness/drum in her ear. No changes in vision, no changes in speech, no weakness, did have diarrhea Tuesday x 1 day, none since then.   Blood pressure 126/80, pulse 65, temperature 97.6 F (36.4 C), resp. rate 16, height 5\' 7"  (1.702 m), weight 167 lb 12.8 oz (76.1 kg), SpO2 97 %.   Medications No current outpatient prescriptions on file prior to visit.   No current facility-administered medications on file prior to visit.     Problem list She has Hyperlipidemia; Vitamin D deficiency; Migraine; and Routine general medical examination at a health care facility on her problem list.   Review of Systems  Constitutional: Positive for fatigue and fever. Negative for chills and diaphoresis.  HENT: Positive for ear pain, hearing loss, postnasal drip, sinus pressure, sneezing and sore throat. Negative for congestion, ear discharge, facial swelling, tinnitus, trouble swallowing and voice change.   Respiratory: Positive for cough. Negative for chest tightness, shortness of breath and wheezing.   Cardiovascular: Negative.   Gastrointestinal: Positive for diarrhea, nausea and vomiting.  Genitourinary: Negative.   Musculoskeletal: Negative for neck pain.  Neurological: Positive for headaches.       Objective:   Physical Exam  Constitutional: She is oriented to person, place, and time. She appears well-developed and well-nourished.  HENT:  Head: Normocephalic and atraumatic.  Right Ear: Decreased hearing is noted.  Left Ear: Decreased hearing is noted.  Mouth/Throat: Oropharynx is clear and  moist.  Bilateral ear cerumen impaction, after cleaning canal normal, TM with effusion, no erythema, swelling.   Eyes: Conjunctivae and EOM are normal. Pupils are equal, round, and reactive to light.  Neck: Normal range of motion. Neck supple. No thyromegaly present.  Cardiovascular: Normal rate, regular rhythm and normal heart sounds.  Exam reveals no gallop and no friction rub.   No murmur heard. Pulmonary/Chest: Effort normal and breath sounds normal. No respiratory distress. She has no wheezes.  Abdominal: Soft. Bowel sounds are normal. She exhibits no distension and no mass. There is no tenderness. There is no rebound and no guarding.  Musculoskeletal: Normal range of motion.  Lymphadenopathy:    She has no cervical adenopathy.  Neurological: She is alert and oriented to person, place, and time. She displays normal reflexes. No cranial nerve deficit. Coordination normal.  Skin: Skin is warm and dry.  Psychiatric: She has a normal mood and affect.       Assessment & Plan:  Acute ear pain, bilateral and Bilateral impacted cerumen Cerumen impaction - stop using Qtips, irrigation used in the office without complications, use OTC drops/oil at home to prevent reoccurence  Nausea and vomiting, intractability of vomiting not specified, unspecified vomiting type ? From norovirus vs chemical, check labs, increase fluids Follow up if any worse - CBC with Differential/Platelet - Comprehensive metabolic panel

## 2016-10-08 NOTE — Patient Instructions (Addendum)
Ask about myomectomy  Use a dropper or use a cap to put olive oil,mineral oil or canola oil in the effected ear- 2-3 times a week. Let it soak for 20-30 min then you can take a shower or use a baby bulb with warm water to wash out the ear wax.  Do not use Qtips  Your ears and sinuses are connected by the eustachian tube. When your sinuses are inflamed, this can close off the tube and cause fluid to collect in your middle ear. This can then cause dizziness, popping, clicking, ringing, and echoing in your ears. This is often NOT an infection and does NOT require antibiotics, it is caused by inflammation so the treatments help the inflammation. This can take a long time to get better so please be patient.  Here are things you can do to help with this: - Try the Flonase or Nasonex. Remember to spray each nostril twice towards the outer part of your eye.  Do not sniff but instead pinch your nose and tilt your head back to help the medicine get into your sinuses.  The best time to do this is at bedtime.Stop if you get blurred vision or nose bleeds.  -While drinking fluids, pinch and hold nose close and swallow, to help open eustachian tubes to drain fluid behind ear drums. -Please pick one of the over the counter allergy medications below and take it once daily for allergies.  It will also help with fluid behind ear drums. Claritin or loratadine cheapest but likely the weakest  Zyrtec or certizine at night because it can make you sleepy The strongest is allegra or fexafinadine  Cheapest at [employer], [employer], [employer] -can use decongestant over the counter, please do not use if you have high blood pressure or certain heart conditions.   if worsening HA, changes vision/speech, imbalance, weakness go to the ER    Norovirus Infection A norovirus infection is caused by exposure to a virus in a group of similar viruses (noroviruses). This type of infection causes inflammation in your stomach and intestines  (gastroenteritis). Norovirus is the most common cause of gastroenteritis. It also causes food poisoning. Anyone can get a norovirus infection. It spreads very easily (contagious). You can get it from contaminated food, water, surfaces, or other people. Norovirus is found in the stool or vomit of infected people. You can spread the infection as soon as you feel sick until 2 weeks after you recover.  CAUSES Norovirus infection is caused by contact with norovirus. You can catch norovirus if you:  Eat or drink something contaminated with norovirus.  Touch surfaces or objects contaminated with norovirus and then put your hand in your mouth.  Have direct contact with an infected person who has symptoms.  Share food, drink, or utensils with someone with who is sick with norovirus. SIGNS AND SYMPTOMS Symptoms of norovirus may include:  Nausea.  Vomiting.  Diarrhea.  Stomach cramps.  Fever.  Chills.  Headache.  Muscle aches.  Tiredness. DIAGNOSIS Your health care provider may suspect norovirus based on your symptoms and physical exam. Your health care provider may also test a sample of your stool or vomit for the virus.  TREATMENT There is no specific treatment for norovirus. Most people get better without treatment in about 2 days. HOME CARE INSTRUCTIONS  Replace lost fluids by drinking plenty of water or rehydration fluids containing important minerals called electrolytes. This prevents dehydration. Drink enough fluid to keep your urine clear or pale yellow.  Do not prepare food for others while you are infected. Wait at least 3 days after recovering from the illness to do that. PREVENTION   Wash your hands often, especially after using the toilet or changing a diaper.  Wash fruits and vegetables thoroughly before preparing or serving them.  Throw out any food that a sick person may have touched.  Disinfect contaminated surfaces immediately after someone in the household has  been sick. Use a bleach-based household cleaner.  Immediately remove and wash soiled clothes or sheets. SEEK MEDICAL CARE IF:  Your vomiting, diarrhea, and stomach pain is getting worse.  Your symptoms of norovirus do not go away after 2-3 days. SEEK IMMEDIATE MEDICAL CARE IF:  You develop symptoms of dehydration that do not improve with fluid replacement. This may include:  Excessive sleepiness.  Lack of tears.  Dry mouth.  Dizziness when standing.  Weak pulse. This information is not intended to replace advice given to you by your health care provider. Make sure you discuss any questions you have with your health care provider. Document Released: 11/02/2002 Document Revised: 09/02/2014 Document Reviewed: 01/20/2014 Elsevier Interactive Patient Education  2017 [employer] Inhalation Injury A chemical inhalation injury is an internal injury, such as lung damage, that results from breathing in fumes of a chemical or harmful substance (toxic agent). Chemical inhalation injuries most often occur:  During fires, when materials that are burned release chemicals into the environment.  During work accidents, when large quantities of toxic chemicals are spilled at [employer] or industrial sites. Chemical inhalation injuries vary in severity. An injury tends to be more severe:  The more acidic or alkaline the chemical is.  The more concentrated the substance is.  The longer you are exposed to the substance. RISK FACTORS You are at a high risk for a chemical inhalation injury if you:  Are exposed to burning materials.  Work with chemicals, solvents, or cleaners. SIGNS AND SYMPTOMS Symptoms of a chemical inhalation injury may include:  Hoarse voice.  Shortness of breath or trouble breathing.  Chest pain.  Pale or blue skin.  Mucus production.  Cough.  Weakness.  Dizziness or fainting. DIAGNOSIS Most chemical inhalation injuries can be diagnosed with a  physical exam and medical history. Tests may be done to check for lung damage. They may include:  A blood oxygen level test.  A chest X-ray.  Pulmonary function tests. There are no tests to identify the specific chemical or substance that caused the injury. TREATMENT  There is no specific treatment for a chemical inhalation injury. Most treatment is directed at improving the ability of the lungs to deliver oxygen to the body. Time is needed for lung tissue to heal. Supportive treatment may include:  Aerosol treatments to decrease swelling in the airways.  Suctioning of the airways to remove excess mucus.  Supplemental oxygen. HOME CARE INSTRUCTIONS  Do not use any tobacco products, including cigarettes, chewing tobacco, or electronic cigarettes. If you need help quitting, ask your health care provider.  Do not allow yourself to be exposed to any airway irritants, such as cigarette smoke or smoke from a fireplace.  Follow your health care provider's instructions for the use of any inhalers.  Take medicines only as directed by your health care provider.  Keep all follow-up visits as directed by your health care provider. This is important. SEEK MEDICAL CARE IF:  Your symptoms are not improving as your health care provider predicted. SEEK IMMEDIATE MEDICAL CARE IF:  Your symptoms get worse.  You have increasing shortness of breath or wheezing.  Your skin or your lips appear very pale or blue.  You have a persistent cough.  You cough up blood or dark material.  You have chest pain or weakness.  You have a fever.  You faint. This information is not intended to replace advice given to you by your health care provider. Make sure you discuss any questions you have with your health care provider. Document Released: 04/15/2014 Document Reviewed: 04/15/2014 Elsevier Interactive Patient Education  2017 [employer].

## 2017-02-03 ENCOUNTER — Ambulatory Visit (INDEPENDENT_AMBULATORY_CARE_PROVIDER_SITE_OTHER): Payer: BLUE CROSS/BLUE SHIELD | Admitting: Internal Medicine

## 2017-02-03 ENCOUNTER — Encounter: Payer: Self-pay | Admitting: Internal Medicine

## 2017-02-03 VITALS — BP 118/78 | HR 65 | Temp 96.8°F | Resp 18 | Ht 67.0 in | Wt 166.0 lb

## 2017-02-03 DIAGNOSIS — R319 Hematuria, unspecified: Secondary | ICD-10-CM

## 2017-02-03 DIAGNOSIS — M940 Chondrocostal junction syndrome [Tietze]: Secondary | ICD-10-CM | POA: Diagnosis not present

## 2017-02-03 MED ORDER — PREDNISONE 20 MG PO TABS: ORAL_TABLET | ORAL | 0 refills | Status: DC

## 2017-02-03 NOTE — Patient Instructions (Signed)
Costochondritis Costochondritis is swelling and irritation (inflammation) of the tissue (cartilage) that connects your ribs to your breastbone (sternum). This causes pain in the front of your chest. The pain usually starts gradually and involves more than one rib. What are the causes? The exact cause of this condition is not always known. It results from stress on the cartilage where your ribs attach to your sternum. The cause of this stress could be:  Chest injury (trauma).  Exercise or activity, such as lifting.  Severe coughing.  What increases the risk? You may be at higher risk for this condition if you:  Are female.  Are 30?61 years old.  Recently started a new exercise or work activity.  Have low levels of vitamin D.  Have a condition that makes you cough frequently.  What are the signs or symptoms? The main symptom of this condition is chest pain. The pain:  Usually starts gradually and can be sharp or dull.  Gets worse with deep breathing, coughing, or exercise.  Gets better with rest.  May be worse when you press on the sternum-rib connection (tenderness).  How is this diagnosed? This condition is diagnosed based on your symptoms, medical history, and a physical exam. Your health care provider will check for tenderness when pressing on your sternum. This is the most important finding. You may also have tests to rule out other causes of chest pain. These may include:  A chest X-ray to check for lung problems.  An electrocardiogram (ECG) to see if you have a heart problem that could be causing the pain.  An imaging scan to rule out a chest or rib fracture.  How is this treated? This condition usually goes away on its own over time. Your health care provider may prescribe an NSAID to reduce pain and inflammation. Your health care provider may also suggest that you:  Rest and avoid activities that make pain worse.  Apply heat or cold to the area to reduce pain  and inflammation.  Do exercises to stretch your chest muscles.  If these treatments do not help, your health care provider may inject a numbing medicine at the sternum-rib connection to help relieve the pain. Follow these instructions at home:  Avoid activities that make pain worse. This includes any activities that use chest, abdominal, and side muscles.  If directed, put ice on the painful area: ? Put ice in a plastic bag. ? Place a towel between your skin and the bag. ? Leave the ice on for 20 minutes, 2-3 times a day.  If directed, apply heat to the affected area as often as told by your health care provider. Use the heat source that your health care provider recommends, such as a moist heat pack or a heating pad. ? Place a towel between your skin and the heat source. ? Leave the heat on for 20-30 minutes. ? Remove the heat if your skin turns bright red. This is especially important if you are unable to feel pain, heat, or cold. You may have a greater risk of getting burned.  Take over-the-counter and prescription medicines only as told by your health care provider.  Return to your normal activities as told by your health care provider. Ask your health care provider what activities are safe for you.  Keep all follow-up visits as told by your health care provider. This is important. Contact a health care provider if:  You have chills or a fever.  Your pain does not go   away or it gets worse.  You have a cough that does not go away (is persistent). Get help right away if:  You have shortness of breath. +++++++++++++++++++++++++++++++++++  Hematuria, Adult Hematuria is blood in your urine. It can be caused by a bladder infection, kidney infection, prostate infection, kidney stone, or cancer of your urinary tract. Infections can usually be treated with medicine, and a kidney stone usually will pass through your urine. If neither of these is the cause of your hematuria, further  workup to find out the reason may be needed. It is very important that you tell your health care provider about any blood you see in your urine, even if the blood stops without treatment or happens without causing pain. Blood in your urine that happens and then stops and then happens again can be a symptom of a very serious condition. Also, pain is not a symptom in the initial stages of many urinary cancers. Follow these instructions at home:  Drink lots of fluid, 3-4 quarts a day. If you have been diagnosed with an infection, cranberry juice is especially recommended, in addition to large amounts of water.  Avoid caffeine, tea, and carbonated beverages because they tend to irritate the bladder.  Avoid alcohol because it may irritate the prostate.  Take all medicines as directed by your health care provider.  If you were prescribed an antibiotic medicine, finish it all even if you start to feel better.  If you have been diagnosed with a kidney stone, follow your health care provider's instructions regarding straining your urine to catch the stone.  Empty your bladder often. Avoid holding urine for long periods of time.  After a bowel movement, women should cleanse front to back. Use each tissue only once.  Empty your bladder before and after sexual intercourse if you are a female. Contact a health care provider if:  You develop back pain.  You have a fever.  You have a feeling of sickness in your stomach (nausea) or vomiting.  Your symptoms are not better in 3 days. Return sooner if you are getting worse. Get help right away if:  You develop severe vomiting and are unable to keep the medicine down.  You develop severe back or abdominal pain despite taking your medicines.  You begin passing a large amount of blood or clots in your urine.  You feel extremely weak or faint, or you pass out. This information is not intended to replace advice given to you by your health care  provider. Make sure you discuss any questions you have with your health care provider. Document Released: 08/12/2005 Document Revised: 01/18/2016 Document Reviewed: 04/12/2013 Elsevier Interactive Patient Education  2017 [employer].

## 2017-02-03 NOTE — Progress Notes (Signed)
  Subjective:    Patient ID: Rebecca [name], female    DOB: Mar 05, 1956, 62 y.o.   MRN: [id number]  HPI   Patient presents with c/o intermittent "blood" or pinkish hue to her urine for 4 days and another  episode about a month ago. She related hx/o a kidney stone that she passed 30 years ago during childbirth. He relates here menses have been irregular and she has hx/o fibroids.        Also , she reports the onset of intermittent sharp pains in her chest  Over the last few days & denies hx/o injury or unusual exercises or activities or positions that could/would cause her pains. No cough , congestion , fevers, chills or rash and no dyspnea.   On No current meds  Allergies  Allergen Reactions  . Citalopram Other (See Comments)    HYPERACTIVITY  . Citalopram     Hyper  . Dexamethasone Other (See Comments)    CHEST TIGHTNESS  . Dexamethasone     Chest tightness  . Red Yeast Rice [Cholestin] Other (See Comments)    headache   Past Medical History:  Diagnosis Date  . Depression    REMISSION/ SITUATIONAL  . Hepatitis AGE 33   RESOLVED  . Hyperlipidemia   . Insomnia   . Migraines    HORMONAL  . Shingles outbreak 2003   RIGHT HIP   Past Surgical History:  Procedure Laterality Date  . KNEE ARTHROSCOPY Right 2002  . TUBAL LIGATION      Review of Systems     Objective:   Physical Exam  BP 118/78   Pulse 65   Temp (!) 96.8 F (36 C)   Resp 18   Ht 5\' 7"  (1.702 m)   Wt 166 lb (75.3 kg)   BMI 26.00 kg/m   HEENT - WNL. Neck - supple.  Chest - Clear equal BS. Trigger point tenderness at the lower parasternal CCjts and in the left lateral chest along the 4&5th ribs in the PAL.  Cor - Nl HS. RRR w/o sig MGR. PP 1(+). No edema.No calf tenderness Abd - Soft non tender.  MS- FROM w/o deformities.  Gait Nl. Neuro -  Nl w/o focal abnormalities.    Assessment & Plan:   1. Hematuria, unspecified type  - Urinalysis, Routine w reflex microscopic - Urine Culture  2.  Costochondritis  - predniSONE (DELTASONE) 20 MG tablet; 1 tab 3 x day for 3 days, then 1 tab 2 x day for 3 days, then 1 tab 1 x day for 5 days  Dispense: 20 tablet; Refill: 0

## 2017-02-04 LAB — URINALYSIS, MICROSCOPIC ONLY
Bacteria, UA: NONE SEEN [HPF]
CASTS: NONE SEEN [LPF]
CRYSTALS: NONE SEEN [HPF]
RBC / HPF: NONE SEEN RBC/HPF (ref <=2)
YEAST: NONE SEEN [HPF]

## 2017-02-04 LAB — URINE CULTURE: ORGANISM ID, BACTERIA: NO GROWTH

## 2017-02-04 LAB — URINALYSIS, ROUTINE W REFLEX MICROSCOPIC
BILIRUBIN URINE: NEGATIVE
GLUCOSE, UA: NEGATIVE
KETONES UR: NEGATIVE
Nitrite: NEGATIVE
PROTEIN: NEGATIVE
Specific Gravity, Urine: 1.007 (ref 1.001–1.035)
pH: 6.5 (ref 5.0–8.0)

## 2017-02-14 ENCOUNTER — Other Ambulatory Visit: Payer: Self-pay | Admitting: Obstetrics and Gynecology

## 2017-02-14 DIAGNOSIS — R928 Other abnormal and inconclusive findings on diagnostic imaging of breast: Secondary | ICD-10-CM

## 2017-02-18 ENCOUNTER — Ambulatory Visit
Admission: RE | Admit: 2017-02-18 | Discharge: 2017-02-18 | Disposition: A | Discharge status: Home / Self Care | Payer: BLUE CROSS/BLUE SHIELD | Source: Ambulatory Visit | Attending: Obstetrics and Gynecology | Admitting: Obstetrics and Gynecology

## 2017-02-18 ENCOUNTER — Other Ambulatory Visit: Payer: Self-pay | Admitting: Obstetrics and Gynecology

## 2017-02-18 DIAGNOSIS — R928 Other abnormal and inconclusive findings on diagnostic imaging of breast: Secondary | ICD-10-CM

## 2017-02-20 ENCOUNTER — Ambulatory Visit: Payer: Self-pay | Admitting: General Surgery

## 2017-02-20 ENCOUNTER — Other Ambulatory Visit: Payer: Self-pay | Admitting: General Surgery

## 2017-02-20 DIAGNOSIS — C50911 Malignant neoplasm of unspecified site of right female breast: Secondary | ICD-10-CM

## 2017-02-21 ENCOUNTER — Encounter: Payer: Self-pay | Admitting: Radiation Oncology

## 2017-02-23 HISTORY — PX: BREAST LUMPECTOMY: SHX2

## 2017-02-25 ENCOUNTER — Telehealth: Payer: Self-pay | Admitting: Hematology and Oncology

## 2017-02-25 ENCOUNTER — Encounter: Payer: Self-pay | Admitting: Hematology and Oncology

## 2017-02-25 ENCOUNTER — Encounter: Payer: Self-pay | Admitting: Genetic Counselor

## 2017-02-25 NOTE — Telephone Encounter (Signed)
Appt has been scheduled for the pt to see Dr. Lindi Adie on 7/18 at 815am. Pt aware to arrive at 8am. Urgent genetic appt scheduled for the pt to see Santiago Glad on 7/9 at 3pm. Pt agreed to all appts. Letter mailed to the pt.

## 2017-02-25 NOTE — Progress Notes (Signed)
Location of Breast Cancer: Right Breast  Histology per Pathology Report:  02/18/17 Diagnosis 1. 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 - SEE COMMENT. 2. Breast, right, needle core biopsy, 11:30 o'clock, smaller mass - FIBROCYSTIC CHANGES. - THERE IS NO EVIDENCE OF MALIGNANCY.  Receptor Status: ER(100%), PR (10%), Her2-neu (NEG), Ki-(40%)  Did patient present with symptoms or was this found on screening mammography?: It was found on a screening mammogram.   Past/Anticipated interventions by surgeon, if any: Dr. Excell Seltzer plans lumpectomy 03/19/17  Past/Anticipated interventions by medical oncology, if any:  Dr. Lindi Adie 03/12/17  Lymphedema issues, if any:  N/A  Pain issues, if any: She denies   SAFETY ISSUES:  Prior radiation? No  Pacemaker/ICD? No  Possible current pregnancy? N/A  Is the patient on methotrexate? No  Current Complaints / other details:    BP 121/72   Pulse 63   Temp 98.2 F (36.8 C)   Ht '5\' 7"'$  (1.702 m)   Wt 167 lb (75.8 kg)   LMP 08/03/2013   SpO2 99% Comment: room air  BMI 26.16 kg/m    Wt Readings from Last 3 Encounters:  03/04/17 167 lb (75.8 kg)  02/03/17 166 lb (75.3 kg)  10/08/16 167 lb 12.8 oz (76.1 kg)      Kiko Ripp, Stephani Police, RN 02/25/2017,3:09 PM

## 2017-02-28 ENCOUNTER — Other Ambulatory Visit: Payer: Self-pay

## 2017-02-28 DIAGNOSIS — Z0000 Encounter for general adult medical examination without abnormal findings: Secondary | ICD-10-CM

## 2017-03-03 ENCOUNTER — Encounter: Payer: Self-pay | Admitting: Genetic Counselor

## 2017-03-03 ENCOUNTER — Other Ambulatory Visit: Payer: BLUE CROSS/BLUE SHIELD

## 2017-03-03 ENCOUNTER — Ambulatory Visit (HOSPITAL_BASED_OUTPATIENT_CLINIC_OR_DEPARTMENT_OTHER): Payer: BLUE CROSS/BLUE SHIELD | Admitting: Genetic Counselor

## 2017-03-03 DIAGNOSIS — Z1502 Genetic susceptibility to malignant neoplasm of ovary: Secondary | ICD-10-CM

## 2017-03-03 DIAGNOSIS — Z808 Family history of malignant neoplasm of other organs or systems: Secondary | ICD-10-CM | POA: Insufficient documentation

## 2017-03-03 DIAGNOSIS — Z7183 Encounter for nonprocreative genetic counseling: Secondary | ICD-10-CM | POA: Diagnosis not present

## 2017-03-03 DIAGNOSIS — C50919 Malignant neoplasm of unspecified site of unspecified female breast: Secondary | ICD-10-CM | POA: Insufficient documentation

## 2017-03-03 DIAGNOSIS — Z1589 Genetic susceptibility to other disease: Secondary | ICD-10-CM

## 2017-03-03 DIAGNOSIS — Z800 Family history of malignant neoplasm of digestive organs: Secondary | ICD-10-CM

## 2017-03-03 DIAGNOSIS — Z1509 Genetic susceptibility to other malignant neoplasm: Secondary | ICD-10-CM

## 2017-03-03 DIAGNOSIS — C50911 Malignant neoplasm of unspecified site of right female breast: Secondary | ICD-10-CM

## 2017-03-03 DIAGNOSIS — Z803 Family history of malignant neoplasm of breast: Secondary | ICD-10-CM | POA: Insufficient documentation

## 2017-03-03 DIAGNOSIS — Z170 Estrogen receptor positive status [ER+]: Secondary | ICD-10-CM

## 2017-03-03 NOTE — Progress Notes (Signed)
REFERRING PROVIDER: Nicholas Lose, MD [address] [place], [place] [address]  PRIMARY PROVIDER:  [name], MD  PRIMARY REASON FOR VISIT:  1. Family history of breast cancer   2. Family history of melanoma   3. Family history of colon cancer   4. Family history of brain cancer   5. Malignant neoplasm of right breast in female, estrogen receptor positive, unspecified site of breast ([place])      HISTORY OF PRESENT ILLNESS:   Rebecca [name], a 61 y.o. female, was seen for a [facility] cancer genetics consultation at the request of Dr. Lindi [name] due to a personal and family history of cancer.  Rebecca [name] presents to clinic today to discuss the possibility of a hereditary predisposition to cancer, genetic testing, and to further clarify her future cancer risks, as well as potential cancer risks for family members.   In 2018, at the age of 44, Rebecca [name] was diagnosed with invasive ductal carcinoma of the right breast. The tumor is ER+/PR+/Her2-. Depending on her genetic testing, this will be treated with lumpectomy, radiation and tamoxifen.      CANCER HISTORY:   No history exists.     HORMONAL RISK FACTORS:  Menarche was at age 74.  First live birth at age 45.  OCP use for approximately 0 years.  Ovaries intact: yes.  Hysterectomy: no.  Menopausal status: postmenopausal.  HRT use: 0 years. Colonoscopy: yes; 3 polyps.  On a 5 year schedule.. Mammogram within the last year: yes. Number of breast biopsies: 1. Up to date with pelvic exams:  yes. Any excessive radiation exposure in the past:  no  Past Medical History:  Diagnosis Date  . Depression    REMISSION/ SITUATIONAL  . Family history of brain cancer   . Family history of breast cancer   . Family history of colon cancer   . Family history of melanoma   . Hepatitis AGE 71   RESOLVED  . Hyperlipidemia   . Insomnia   . Migraines    HORMONAL  . Shingles outbreak 2003   RIGHT HIP    Past Surgical History:   Procedure Laterality Date  . KNEE ARTHROSCOPY Right 2002  . TUBAL LIGATION      Social History   Social History  . Marital status: Married    Spouse name: N/A  . Number of children: N/A  . Years of education: N/A   Social History Main Topics  . Smoking status: Former Smoker    Quit date: 08/26/2008  . Smokeless tobacco: Never Used  . Alcohol use No     Comment: OCCASIONAL  . Drug use: No  . Sexual activity: Yes    Birth control/ protection: None   Other Topics Concern  . None   Social History Narrative   ** Merged History Encounter **         FAMILY HISTORY:  We obtained a detailed, 4-generation family history.  Significant diagnoses are listed below: Family History  Problem Relation Age of Onset  . Depression Brother   . Lung cancer Brother 53       maternal half brother  . Dementia Mother        Rebecca [name]  . Cancer Father 15       MELANOMA CAUSED DEATH/melanoma  . Cancer Brother        multiple myeloma/lung - maternal half brother  . Stroke Maternal Grandmother 80  . Cancer Sister 24       breast- deceased  at 64 - 1/2 sister  . Breast cancer Sister   . Heart disease Maternal Grandfather   . Cancer Paternal Grandfather        COLON  . Brain cancer Maternal Aunt   . Lung cancer Maternal Uncle   . Other Paternal Grandmother        died in childbirth  . Dementia Maternal Aunt     The patient has two sons who are cancer free.  She has four maternal half siblings, two brothers and two sisters - all are deceased.  One brother died from suicide after a diagnosis of lung cancer, and the other had multiple myeloma.  One sister was diagnosed with breast cancer at 74 and died at 59.  The patient's mother died from complications of dementia, and her father had melanoma.  The patient's mother had 12 siblings.  One sister is living with dementia, one sister had brain cancer, and a brother had lung cancer.  The rest are cancer free.  The patient's maternal grandparents  are deceased.  Her grandmother died from a stroke and her grandfather died of a heart attack.  The patient's father had melanoma on his chest and died at 67.  He had two brothers and two sisters, none had cancer. His mother died in childbirth with him.  His father died of colon cancer.  Patient's maternal ancestors are of Caucasian and [place] descent, and paternal ancestors are of Caucasian descent. There is no reported Ashkenazi Jewish ancestry. There is no known consanguinity.  GENETIC COUNSELING ASSESSMENT: Rebecca [name] is a 61 y.o. female with a personal and family history of breast cancer which is somewhat suggestive of a hereditary cancer syndrome and predisposition to cancer. We, therefore, discussed and recommended the following at today's visit.   DISCUSSION: We discussed that about 5-10% of breast cancer is due to hereditary causes, most commonly BRCA mutations.  Other genes associated with hereditary breast cancer include ATM, CHEK2, and PALB2.  We reviewed the characteristics, features and inheritance patterns of hereditary cancer syndromes. We also discussed genetic testing, including the appropriate family members to test, the process of testing, insurance coverage and turn-around-time for results. We discussed the implications of a negative, positive and/or variant of uncertain significant result. In order to get genetic test results in a timely manner so that Rebecca [name] can use these genetic test results for surgical decisions, we recommended Rebecca [name] pursue genetic testing for the 9 gene STAT panel. If this test is negative, we then recommend Rebecca [name] pursue reflex genetic testing to the Multi- gene panel. The Multi-Gene Panel offered by [facility] includes sequencing and/or deletion duplication testing of the following 80 genes: ALK, APC, ATM, AXIN2,BAP1,  BARD1, BLM, BMPR1A, BRCA1, BRCA2, BRIP1, CASR, CDC73, CDH1, CDK4, CDKN1B, CDKN1C, CDKN2A (p14ARF), CDKN2A (p16INK4a), CEBPA, CHEK2,  CTNNA1, DICER1, DIS3L2, EGFR (c.2369C>T, p.Thr790Met variant only), EPCAM (Deletion/duplication testing only), FH, FLCN, GATA2, GPC3, GREM1 (Promoter region deletion/duplication testing only), HOXB13 (c.251G>A, p.Gly84Glu), HRAS, KIT, MAX, MEN1, MET, MITF (c.952G>A, p.Glu318Lys variant only), MLH1, MSH2, MSH3, MSH6, MUTYH, NBN, NF1, NF2, NTHL1, PALB2, PDGFRA, PHOX2B, PMS2, POLD1, POLE, POT1, PRKAR1A, PTCH1, PTEN, RAD50, RAD51C, RAD51D, RB1, RECQL4, RET, RUNX1, SDHAF2, SDHA (sequence changes only), SDHB, SDHC, SDHD, SMAD4, SMARCA4, SMARCB1, SMARCE1, STK11, SUFU, TERT, TERT, TMEM127, TP53, TSC1, TSC2, VHL, WRN and WT1.    Based on Rebecca [name]'s personal and family history of cancer, she meets medical criteria for genetic testing. Despite that she meets criteria, she may still have an out of  pocket cost. We discussed that if her out of pocket cost for testing is over $100, the laboratory will call and confirm whether she wants to proceed with testing.  If the out of pocket cost of testing is less than $100 she will be billed by the genetic testing laboratory.   PLAN: After considering the risks, benefits, and limitations, Rebecca [name]  provided informed consent to pursue genetic testing and the blood sample was sent to [facility] for analysis of the STAT test, with a reflex to the [facility] panel. Results should be available within approximately 7 days' time, at which point they will be disclosed by telephone to Rebecca [name], as will any additional recommendations warranted by these results. Rebecca [name] will receive a summary of her genetic counseling visit and a copy of her results once available. This information will also be available in Epic. We encouraged Rebecca [name] to remain in contact with cancer genetics annually so that we can continuously update the family history and inform her of any changes in cancer genetics and testing that may be of benefit for her family. Rebecca [name] questions were answered to her  satisfaction today. Our contact information was provided should additional questions or concerns arise.  Lastly, we encouraged Rebecca [name] to remain in contact with cancer genetics annually so that we can continuously update the family history and inform her of any changes in cancer genetics and testing that may be of benefit for this family.   Ms.  [name] questions were answered to her satisfaction today. Our contact information was provided should additional questions or concerns arise. Thank you for the referral and allowing [place] to share in the care of your patient.   Karen P. Florene Glen, [place], [facility] Certified Genetic Counselor [email or web address] phone: [phone]  The patient was seen for a total of 45 minutes in face-to-face genetic counseling.  This patient was discussed with Drs. Magrinat, Rebecca [name] and/or Burr Medico who agrees with the above.    _______________________________________________________________________ For Office Staff:  Number of people involved in session: 1 Was an Intern/ student involved with case: no

## 2017-03-04 ENCOUNTER — Ambulatory Visit
Admission: RE | Admit: 2017-03-04 | Discharge: 2017-03-04 | Disposition: A | Discharge status: Home / Self Care | Payer: BLUE CROSS/BLUE SHIELD | Source: Ambulatory Visit | Attending: Radiation Oncology | Admitting: Radiation Oncology

## 2017-03-04 ENCOUNTER — Encounter: Payer: Self-pay | Admitting: Radiation Oncology

## 2017-03-04 DIAGNOSIS — C50911 Malignant neoplasm of unspecified site of right female breast: Secondary | ICD-10-CM

## 2017-03-04 DIAGNOSIS — C50411 Malignant neoplasm of upper-outer quadrant of right female breast: Secondary | ICD-10-CM

## 2017-03-04 DIAGNOSIS — Z170 Estrogen receptor positive status [ER+]: Secondary | ICD-10-CM | POA: Diagnosis present

## 2017-03-05 DIAGNOSIS — C50411 Malignant neoplasm of upper-outer quadrant of right female breast: Secondary | ICD-10-CM | POA: Insufficient documentation

## 2017-03-05 DIAGNOSIS — Z170 Estrogen receptor positive status [ER+]: Secondary | ICD-10-CM | POA: Insufficient documentation

## 2017-03-05 NOTE — Progress Notes (Signed)
Radiation Oncology         [phone]) [phone] ________________________________  Initial outpatient Consultation  Name: Rebecca [name] MRN: [id number]  Date: 03/04/2017  DOB: 09/20/1955  CC:[name], MD  Excell Seltzer, MD   REFERRING PHYSICIAN:  Excell Seltzer, MD  DIAGNOSIS:    ICD-10-CM   1. Malignant neoplasm of right breast in female, estrogen receptor positive, unspecified site of breast [facility]) C50.911 Ambulatory referral to Social Work   Z17.0   2. Carcinoma of upper-outer quadrant of right breast in female, estrogen receptor positive ([place]) C50.411    Z17.0   [id number] Stage I Right breast cancer, ER /PR +, HER2 neg, Grade II  CHIEF COMPLAINT: Here to discuss management of right breast cancer  HISTORY OF PRESENT ILLNESS::Rebecca [name] is a 61 y.o. female who presented with mammographic screening detected right breast mass on 02/18/17. She had not palpated an abnormality in the breast. On [place] the mass was 1.0 cm and axilla was negative.  The patient underwent biopsy of the right breast on 02/18/17. Biopsy of the larger mass in the 11:30 o'clock position revealed invasive ductal carcinoma and DCIS, grade II. Pathology revealed this to be ER/PR positive, Her2 negative. Biopsy of the smaller mass found on US revealed fibrocystic changes with no evidence of malignancy.  The patient presents to the clinic today to discuss the role that radiation therapy may play in the treatment of her disease. The patient is accompanied by a friend today.  On review of systems, the patient denies pain. She reports monthly headaches since starting menopause one year ago; these last several days and are not alleviated with medication. No other neurological symptoms. She also reports significant pain in her right arm when she raises it above her head. She denies palpating any breast or neck lumps. She reports some shortness of breath, which she speculates could be related to anxiety. She denies a cough.  She denies chest or abdominal pain. She denies swelling of upper or lower extremities, though she does report numbness to her lateral right lower leg. She denies skin rashes.  Of note, the patient reports her sister passed away from breast cancer at age 50. She also has a maternal cousin who passed away with breast cancer. Her father passed with melanoma.  The patient is scheduled to consult with Dr. Lindi Adie on 03/12/17. She is planning to undergo lumpectomy with Dr. Excell Seltzer on 03/19/17. She has undergone initial genetic counseling and is waiting on genetic results.  PREVIOUS RADIATION THERAPY: No  PAST MEDICAL HISTORY:  has a past medical history of Depression; Family history of brain cancer; Family history of breast cancer; Family history of colon cancer; Family history of melanoma; Hepatitis (AGE 25); Hyperlipidemia; Insomnia; Migraines; and Shingles outbreak (2003, 2015).    PAST SURGICAL HISTORY: Past Surgical History:  Procedure Laterality Date  . KNEE ARTHROSCOPY Right 2002  . TUBAL LIGATION      FAMILY HISTORY: family history includes Brain cancer in her maternal aunt; Breast cancer in her sister; Cancer in her brother and paternal grandfather; Cancer (age of onset: 49) in her sister; Cancer (age of onset: 73) in her father; Dementia in her maternal aunt and mother; Depression in her brother; Heart disease in her maternal grandfather; Lung cancer in her maternal uncle; Lung cancer (age of onset: 43) in her brother; Other in her paternal grandmother; Stroke (age of onset: 82) in her maternal grandmother.  SOCIAL HISTORY:  reports that she quit smoking about 8 years ago. She has  never used smokeless tobacco. She reports that she does not drink alcohol or use drugs. 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  ALLERGIES: Citalopram; Citalopram; Dexamethasone; Dexamethasone; Propoxycaine; Red yeast rice [cholestin]; Rofecoxib; and Sulfamethoxazole  MEDICATIONS:  Current  Outpatient Prescriptions  Medication Sig Dispense Refill  . ibuprofen (ADVIL,MOTRIN) 200 MG tablet Take 200 mg by mouth every 6 (six) hours as needed.    . ALPRAZolam (XANAX) 0.25 MG tablet   3   No current facility-administered medications for this encounter.     REVIEW OF SYSTEMS: A 10+ POINT REVIEW OF SYSTEMS WAS OBTAINED including neurology, dermatology, psychiatry, cardiac, respiratory, lymph, extremities, GI, GU, musculoskeletal, constitutional, reproductive, HEENT. All pertinent positives are noted in the HPI. All others are negative.   PHYSICAL EXAM:  height is 5\' 7"  (1.702 m) and weight is 167 lb (75.8 kg). Her temperature is 98.2 F (36.8 C). Her blood pressure is 121/72 and her pulse is 63. Her oxygen saturation is 99%.   General: Alert and oriented, in no acute distress. HEENT: Head is normocephalic. Oropharynx and oral cavity are clear. Neck: Neck is supple, no palpable cervical or supraclavicular lymphadenopathy or masses. Heart: Regular in rate and rhythm with no murmurs. Chest: Clear to auscultation bilaterally. Abdomen: Soft, non tender, non distended. Extremities: No edema in wrists or ankles. Lymphatics: see Neck Exam Skin: No concerning lesions. Musculoskeletal: symmetric strength and muscle tone throughout. Rapidly alternating movements are intact. Neurologic: No obvious focalities. Speech is fluent. Coordination is intact. Psychiatric: Judgment and insight are intact. Affect is appropriate. Breast: She has a bruise at the 11 o'clock region of the right breast from her recent biopsy. There is a little bit of firmness under the biopsy, but no obvious mass. No lymph node masses in the right axilla. No palpable masses in the left breast or axilla.  ECOG = 0  LABORATORY DATA:  Lab Results  Component Value Date   WBC 4.1 10/08/2016   HGB 13.5 10/08/2016   HCT 40.8 10/08/2016   MCV 89.9 10/08/2016   PLT 169 10/08/2016   CMP     Component Value Date/Time   NA 141  10/08/2016 1201   K 4.2 10/08/2016 1201   CL 104 10/08/2016 1201   CO2 28 10/08/2016 1201   GLUCOSE 86 10/08/2016 1201   BUN 16 10/08/2016 1201   CREATININE 0.64 10/08/2016 1201   CALCIUM 8.9 10/08/2016 1201   PROT 7.0 10/08/2016 1201   ALBUMIN 4.3 10/08/2016 1201   AST 17 10/08/2016 1201   ALT 15 10/08/2016 1201   ALKPHOS 58 10/08/2016 1201   BILITOT 0.6 10/08/2016 1201   GFRNONAA >89 03/20/2016 0948   GFRAA >89 03/20/2016 0948   RADIOGRAPHY: 03/22/2016 Breast Ltd Uni Right Inc Axilla  Result Date: 02/18/2017 CLINICAL DATA:  Screening recall for a right breast mass. EXAM: 2D DIGITAL DIAGNOSTIC UNILATERAL RIGHT MAMMOGRAM WITH CAD AND ADJUNCT TOMO RIGHT BREAST ULTRASOUND COMPARISON:  Previous exam(s). ACR Breast Density Category c: The breast tissue is heterogeneously dense, which may obscure small masses. FINDINGS: In the superior far posterior right breast there is an irregular mass with spiculated margins measuring approximately 2 cm. Mammographic images were processed with CAD. Physical exam of the superior right breast demonstrates a subtle deep firm mass at the site of the expected abnormality based on the mammogram imaging. Ultrasound targeted to the right breast at 1130, 8 cm from the nipple demonstrates an irregular hypoechoic mass with indistinct and angular margins measuring 1.0 x 0.6  x 0.7 cm. Blood flow was documented within the mass on color Doppler imaging. Approximately 2 cm closer to the nipple there are 2 adjacent small hypoechoic masses together measuring 0.7 x 0.3 x 0.4 cm. No blood flow was identified within these masses on color Doppler imaging. Ultrasound of the right axilla demonstrates multiple normal-appearing lymph nodes. IMPRESSION: 1. There is a highly suspicious 1.0 cm mass in the right breast at 11:30, 8 cm from the nipple. 2. The 2 adjacent indeterminate masses are identified at 11:30, 6 cm from the nipple. 3.  No evidence of right axillary lymphadenopathy. RECOMMENDATION:  Ultrasound-guided biopsy is recommended for the masses in the right breast at 11:30, 8 cm from the nipple and 6 cm from the nipple. These have been added on to today's interventional schedule. I have discussed the findings and recommendations with the patient. Results were also provided in writing at the conclusion of the visit. If applicable, a reminder letter will be sent to the patient regarding the next appointment. BI-RADS CATEGORY  5: Highly suggestive of malignancy. Electronically Signed   By: Ammie Ferrier M.D.   On: 02/18/2017 10:02   Mm Diag Breast Tomo Uni Right  Result Date: 02/18/2017 CLINICAL DATA:  Screening recall for a right breast mass. EXAM: 2D DIGITAL DIAGNOSTIC UNILATERAL RIGHT MAMMOGRAM WITH CAD AND ADJUNCT TOMO RIGHT BREAST ULTRASOUND COMPARISON:  Previous exam(s). ACR Breast Density Category c: The breast tissue is heterogeneously dense, which may obscure small masses. FINDINGS: In the superior far posterior right breast there is an irregular mass with spiculated margins measuring approximately 2 cm. Mammographic images were processed with CAD. Physical exam of the superior right breast demonstrates a subtle deep firm mass at the site of the expected abnormality based on the mammogram imaging. Ultrasound targeted to the right breast at 1130, 8 cm from the nipple demonstrates an irregular hypoechoic mass with indistinct and angular margins measuring 1.0 x 0.6 x 0.7 cm. Blood flow was documented within the mass on color Doppler imaging. Approximately 2 cm closer to the nipple there are 2 adjacent small hypoechoic masses together measuring 0.7 x 0.3 x 0.4 cm. No blood flow was identified within these masses on color Doppler imaging. Ultrasound of the right axilla demonstrates multiple normal-appearing lymph nodes. IMPRESSION: 1. There is a highly suspicious 1.0 cm mass in the right breast at 11:30, 8 cm from the nipple. 2. The 2 adjacent indeterminate masses are identified at 11:30, 6 cm  from the nipple. 3.  No evidence of right axillary lymphadenopathy. RECOMMENDATION: Ultrasound-guided biopsy is recommended for the masses in the right breast at 11:30, 8 cm from the nipple and 6 cm from the nipple. These have been added on to today's interventional schedule. I have discussed the findings and recommendations with the patient. Results were also provided in writing at the conclusion of the visit. If applicable, a reminder letter will be sent to the patient regarding the next appointment. BI-RADS CATEGORY  5: Highly suggestive of malignancy. Electronically Signed   By: Ammie Ferrier M.D.   On: 02/18/2017 10:02   Mm Clip Placement Right  Result Date: 02/18/2017 CLINICAL DATA:  Right breast masses for biopsy EXAM: DIAGNOSTIC RIGHT MAMMOGRAM POST ULTRASOUND BIOPSY COMPARISON:  Previous exam(s). FINDINGS: Mammographic images were obtained following right breast ultrasound guided biopsy of 2 masses at 11:30 o'clock. A ribbon biopsy clip is associated with the larger mass at 11:30 o'clock in expected position. A coil biopsy clip is associated with a smaller mass at  11:30 o'clock in expected position. IMPRESSION: Post biopsy clips in expected position. Final Assessment: Post Procedure Mammograms for Marker Placement Electronically Signed   By: Abelardo Diesel M.D.   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 This was found to be concordant by Dr. Abelardo Diesel. Pathology results were discussed with the patient by telephone. The patient reported doing well after the biopsies with tenderness at the site. Post biopsy instructions and care were reviewed and questions were answered. The patient was  encouraged to call [facility] for any additional concerns. Surgical consultation has been arranged with Dr. Adonis Housekeeper at [facility] on February 20, 2017. Pathology results reported by Susa Raring RN, BSN on 02/19/2017. Electronically Signed   By: Abelardo Diesel M.D.   On: 02/19/2017 10:56   Result Date: 02/19/2017 CLINICAL DATA:  Suspicious masses right breast for biopsy. EXAM: ULTRASOUND GUIDED RIGHT BREAST CORE NEEDLE BIOPSY COMPARISON:  Previous exam(s). FINDINGS: I met with the patient and we discussed the procedure of ultrasound-guided biopsy, including benefits and alternatives. We discussed the high likelihood of a successful procedure. We discussed the risks of the procedure, including infection, bleeding, tissue injury, clip migration, and inadequate sampling. Informed written consent was given. The usual time-out protocol was performed immediately prior to the procedure. Lesion quadrant: Upper-outer quadrant Using sterile technique and 1% Lidocaine as local anesthetic, under direct ultrasound visualization, a 12 gauge spring-loaded device was used to perform biopsy of spiculated larger mass right breast 11:30 o'clock using a lateral approach. At the conclusion of the procedure a ribbon tissue marker clip was deployed into the biopsy cavity. Follow up 2 view mammogram was performed and dictated separately. Using sterile technique and 1% Lidocaine as local anesthetic, under direct ultrasound visualization, a 14 gauge spring-loaded device was used to perform biopsy of small oval hypoechoic mass at the right breast 11:30 o'clock using a lateral approach. At the conclusion of the procedure a coil tissue marker clip was deployed into the biopsy cavity. Follow up 2 view mammogram was performed and dictated separately. IMPRESSION: Ultrasound guided biopsies of right breast. No apparent complications. Electronically Signed: By: Abelardo Diesel M.D. 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 This was found to be concordant by Dr. Abelardo Diesel. Pathology results were discussed with the patient by telephone. The patient reported doing well after the biopsies with tenderness at the site. Post biopsy instructions and care were reviewed and questions were answered. The patient was encouraged to call [facility] for any additional concerns. Surgical consultation has been arranged with Dr. Adonis Housekeeper at [facility] on February 20, 2017. Pathology results reported by Susa Raring RN, BSN on 02/19/2017. Electronically Signed   By: Abelardo Diesel M.D.   On: 03/03/2017 15:25   Result Date: 03/03/2017 CLINICAL DATA:  Suspicious masses  right breast for biopsy. EXAM: ULTRASOUND GUIDED RIGHT BREAST CORE NEEDLE BIOPSY COMPARISON:  Previous exam(s). FINDINGS: I met with the patient and we discussed the procedure of ultrasound-guided biopsy, including benefits and alternatives. We discussed the high likelihood of a successful procedure. We discussed the risks of the procedure, including infection, bleeding, tissue injury, clip migration, and inadequate sampling. Informed written consent was given. The usual time-out protocol was performed immediately prior to the procedure. Lesion quadrant: Upper-outer quadrant Using sterile technique and 1% Lidocaine as local anesthetic, under direct ultrasound visualization, a 12 gauge spring-loaded device was used to perform biopsy of spiculated larger mass right breast 11:30 o'clock using a lateral approach. At the conclusion of the procedure a ribbon tissue marker clip was deployed into the biopsy  cavity. Follow up 2 view mammogram was performed and dictated separately. Using sterile technique and 1% Lidocaine as local anesthetic, under direct ultrasound visualization, a 14 gauge spring-loaded device was used to perform biopsy of small oval hypoechoic mass at the right breast 11:30 o'clock using a lateral approach. At the conclusion of the procedure a coil tissue marker clip was deployed into the biopsy cavity. Follow up 2 view mammogram was performed and dictated separately. IMPRESSION: Ultrasound guided biopsies of right breast. No apparent complications. Electronically Signed: By: Abelardo Diesel M.D. On: 03/03/2017 14:43      IMPRESSION/PLAN: Rebecca [name] is a delightful patient with right breast cancer.  I would recommend 4  weeks of radiation therapy to reduce the risk of locoregional recurrence by 2/3, pending surgical results. We discussed the benefits, risks, and associated side effects of radiation therapy, as well as the logistics of delivery for the patient's education. The patient was encouraged to ask questions which I answered to the best of my ability.  I encouraged the patient to stay active as she is able in order to maintain body condition and deter fatigue.  I offered to refer the patient to PT for her right arm pain. She may need to increase her flexibility prior to radiation therapy. 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  I look forward to participating in the treatment of this patient in the future, and will await her referral back to this clinic following surgery for simulation and treatment planning. A consent form was discussed and signed today.     __________________________________________   Eppie Gibson, MD  This document serves as a record of services personally performed by Eppie Gibson, MD. It was created on her behalf by Maryla Morrow, a trained medical scribe. The creation of this  record is based on the scribe's personal observations and the provider's statements to them. This document has been checked and approved by the attending provider.

## 2017-03-06 ENCOUNTER — Ambulatory Visit: Payer: BLUE CROSS/BLUE SHIELD | Admitting: Physical Therapy

## 2017-03-07 ENCOUNTER — Encounter: Payer: Self-pay | Admitting: Hematology and Oncology

## 2017-03-07 NOTE — Progress Notes (Signed)
Called patient referral was received by [facility] to follow up on financial questions or concerns.  Patient expressed concerns of expenses and cost related to surgery and treatment. Asked patient if she knows what her treatment plan is. Patient states she will be having surgery and then radiation, antiestrogens and no chemotherapy. Advised patient that once her surgery is complete and she starts Radiation, she can contact [facility]red[facility]([employer] [facility]) to discuss cost and possible financial assistance for treatment if available and any other resources that may be available. Also advised her that she may apply for the [employer] grant through them as well once in active treatment. Patient wanted to know the income guidelines and I provided this information to her based on household size of 2 and 250% of the FP guidelines.   Patient states she has insurance with a high deductible/OOP and knows she will be receiving some expensive bills. Advised patient I am not sure of what radiation charges look like and how her insurance will pay, but the [employer] may have more detailed information regarding this. She verbalized understanding and has my contact name and number for any additional questions and states she was given the information for [facility] as well.

## 2017-03-10 ENCOUNTER — Telehealth: Payer: Self-pay | Admitting: Genetic Counselor

## 2017-03-10 ENCOUNTER — Encounter: Payer: Self-pay | Admitting: Genetic Counselor

## 2017-03-10 DIAGNOSIS — Z1379 Encounter for other screening for genetic and chromosomal anomalies: Secondary | ICD-10-CM | POA: Insufficient documentation

## 2017-03-10 NOTE — Telephone Encounter (Signed)
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  She will come in for genetic counseling on July 20.  She asked if this means that they "will take my breasts"?  I mentioned that the guidelines suggest management for CHEK2 is based on family history, but that there is not a strong recommendation for bilateral mastectomy.  She should talk with Dr. Excell Seltzer about this further.  Patient voiced understanding.

## 2017-03-12 ENCOUNTER — Encounter: Payer: Self-pay | Admitting: *Deleted

## 2017-03-12 ENCOUNTER — Telehealth: Payer: Self-pay

## 2017-03-12 ENCOUNTER — Ambulatory Visit (HOSPITAL_BASED_OUTPATIENT_CLINIC_OR_DEPARTMENT_OTHER): Payer: BLUE CROSS/BLUE SHIELD | Admitting: Hematology and Oncology

## 2017-03-12 ENCOUNTER — Encounter: Payer: Self-pay | Admitting: Hematology and Oncology

## 2017-03-12 ENCOUNTER — Encounter (HOSPITAL_BASED_OUTPATIENT_CLINIC_OR_DEPARTMENT_OTHER): Payer: Self-pay | Admitting: *Deleted

## 2017-03-12 DIAGNOSIS — Z1502 Genetic susceptibility to malignant neoplasm of ovary: Secondary | ICD-10-CM

## 2017-03-12 DIAGNOSIS — C50919 Malignant neoplasm of unspecified site of unspecified female breast: Secondary | ICD-10-CM

## 2017-03-12 DIAGNOSIS — Z1589 Genetic susceptibility to other disease: Secondary | ICD-10-CM

## 2017-03-12 DIAGNOSIS — Z1509 Genetic susceptibility to other malignant neoplasm: Secondary | ICD-10-CM

## 2017-03-12 DIAGNOSIS — C50411 Malignant neoplasm of upper-outer quadrant of right female breast: Secondary | ICD-10-CM

## 2017-03-12 DIAGNOSIS — Z170 Estrogen receptor positive status [ER+]: Secondary | ICD-10-CM | POA: Diagnosis not present

## 2017-03-12 NOTE — Progress Notes (Signed)
[facility] NOTE  Patient Care Team: [name], MD as PCP - General (Internal Medicine) [name], MD (Internal Medicine) Arta Silence, MD as Consulting Physician (Gastroenterology) Arvella Nigh, MD as Consulting Physician (Obstetrics and Gynecology)  CHIEF COMPLAINTS/PURPOSE OF CONSULTATION:  Newly diagnosed breast cancer  HISTORY OF PRESENTING ILLNESS:  Rebecca [name] 61 y.o. female is here because of recent diagnosis of right breast cancer. Patient had a routine screening mammogram that detected a 1 cm abnormality in the right breast 11:30 position 8 cm from the nipple. 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 She had additional biopsy of adjacent nodules which came back as fibroadenomas. 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 She is here accompanied by her friend to discuss treatment options.    I reviewed her records extensively and collaborated the history with the patient.  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 Depression    REMISSION/ SITUATIONAL  . Family history of brain cancer   . Family history of breast cancer   . Family history of colon cancer   . Family history of melanoma   . Hepatitis AGE 28    RESOLVED  . Hyperlipidemia   . Insomnia   . Migraines    HORMONAL  . Shingles outbreak 2003, 2015   RIGHT HIP, and between breasts 3 years ago    SURGICAL HISTORY: Past Surgical History:  Procedure Laterality Date  . KNEE ARTHROSCOPY Right 2002  . TUBAL LIGATION      SOCIAL HISTORY: Social History   Social History  . Marital status: Married    Spouse name: N/A  . Number of children: N/A  . Years of education: N/A   Occupational History  . Not on file.   Social History Main Topics  . Smoking status: Former Smoker    Quit date: 08/26/2008  . Smokeless tobacco: Never Used  . Alcohol use No     Comment: OCCASIONAL  . Drug use: No  . Sexual activity: Yes    Birth control/ protection: None   Other Topics Concern  . Not on file   Social History Narrative   ** Merged History Encounter **        FAMILY HISTORY: Family History  Problem Relation Age of Onset  . Depression Brother   . Lung cancer Brother 19       maternal half brother  . Dementia Mother        Delman Kitten  . Cancer Father 6       MELANOMA CAUSED DEATH/melanoma  . Cancer Brother        multiple myeloma/lung - maternal half brother  . Stroke Maternal Grandmother 80  .  Cancer Sister 2       breast- deceased at 4 - 1/2 sister  . Breast cancer Sister   . Heart disease Maternal Grandfather   . Cancer Paternal Grandfather        COLON  . Brain cancer Maternal Aunt   . Lung cancer Maternal Uncle   . Other Paternal Grandmother        died in childbirth  . Dementia Maternal Aunt     ALLERGIES:  is allergic to citalopram; citalopram; dexamethasone; dexamethasone; propoxycaine; red yeast rice [cholestin]; rofecoxib; and sulfamethoxazole.  MEDICATIONS:  Current Outpatient Prescriptions  Medication Sig Dispense Refill  . ALPRAZolam (XANAX) 0.25 MG tablet   3  . ibuprofen (ADVIL,MOTRIN) 200 MG tablet Take 200 mg by mouth every 6 (six) hours as needed.     No current facility-administered  medications for this visit.     REVIEW OF SYSTEMS:   Constitutional: Denies fevers, chills or abnormal night sweats Eyes: Denies blurriness of vision, double vision or watery eyes Ears, nose, mouth, throat, and face: Denies mucositis or sore throat Respiratory: Denies cough, dyspnea or wheezes Cardiovascular: Denies palpitation, chest discomfort or lower extremity swelling Gastrointestinal:  Denies nausea, heartburn or change in bowel habits Skin: Denies abnormal skin rashes Lymphatics: Denies new lymphadenopathy or easy bruising Neurological:Denies numbness, tingling or new weaknesses Behavioral/Psych: Mood is stable, no new changes  Breast:  Denies any palpable lumps or discharge All other systems were reviewed with the patient and are negative.  PHYSICAL EXAMINATION: ECOG PERFORMANCE STATUS: 1 - Symptomatic but completely ambulatory  Vitals:   03/12/17 0803  BP: (!) 124/55  Pulse: 71  Resp: 18  Temp: 98.4 F (36.9 C)   Filed Weights   03/12/17 0803  Weight: 165 lb 8 oz (75.1 kg)    GENERAL:alert, no distress and comfortable SKIN: skin color, texture, turgor are normal, no rashes or significant lesions EYES: normal, conjunctiva are pink and non-injected, sclera clear OROPHARYNX:no exudate, no erythema and lips, buccal mucosa, and tongue normal  NECK: supple, thyroid normal size, non-tender, without nodularity LYMPH:  no palpable lymphadenopathy in the cervical, axillary or inguinal LUNGS: clear to auscultation and percussion with normal breathing effort HEART: regular rate & rhythm and no murmurs and no lower extremity edema ABDOMEN:abdomen soft, non-tender and normal bowel sounds Musculoskeletal:no cyanosis of digits and no clubbing  PSYCH: alert & oriented x 3 with fluent speech NEURO: no focal motor/sensory deficits BREAST: No palpable nodules in breast. No palpable axillary or supraclavicular lymphadenopathy (exam performed in the presence of a chaperone)    LABORATORY DATA:  I have reviewed the data as listed Lab Results  Component Value Date   WBC 4.1 10/08/2016   HGB 13.5 10/08/2016   HCT 40.8 10/08/2016   MCV 89.9 10/08/2016   PLT 169 10/08/2016   Lab Results  Component Value Date   NA 141 10/08/2016   K 4.2 10/08/2016   CL 104 10/08/2016   CO2 28 10/08/2016    RADIOGRAPHIC STUDIES: I have personally reviewed the radiological reports and agreed with the findings in the report.  ASSESSMENT AND PLAN:  Carcinoma of upper-outer quadrant of right breast in female, estrogen receptor positive ([place]) 02/18/2017: Right breast 1 cm mass 8 cm from nipple: biopsy 11:30 position: Grade 2 IDC with DCIS ER 100%, PR 10%, Ki-67 40%, HER-2 negative ratio 1.73; right breast 11:30 position 6 cm from nipple 0.7 cm biopsy: Fibrocystic change; T1b N0 stage IA clinical stage  Pathology and radiology counseling:Discussed  with the patient, the details of pathology including the type of breast cancer,the clinical staging, the significance of ER, PR and HER-2/neu receptors and the implications for treatment. After reviewing the pathology in detail, we proceeded to discuss the different treatment options between surgery, radiation, chemotherapy, antiestrogen therapies.  Recommendations: 1. Breast conserving surgery followed by 2. Oncotype DX testing to determine if chemotherapy would be of any benefit followed by 3. Adjuvant radiation therapy followed by 4. Adjuvant antiestrogen therapy Genetics consultation will also be requested because her sister age 75 had breast cancer  Oncotype counseling: I discussed Oncotype DX test. I explained to the patient that this is a 21 gene panel to evaluate patient tumors DNA to calculate recurrence score. This would help determine whether patient has high risk or intermediate risk or low risk breast cancer. She understands that if her tumor was found to be high risk, she would benefit from systemic chemotherapy. If low  risk, no need of chemotherapy. If she was found to be intermediate risk, we would need to evaluate the score as well as other risk factors and determine if an abbreviated chemotherapy may be of benefit.  Return to clinic after surgery to discuss final pathology report and then determine if Oncotype DX testing will need to be sent.     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 Based on NCCN guidelines, this is a pathogenic mutation that has been associated with risk of not only breast cancer but also colon, Prostate in men, thyroid and kidney cancers.   Pathogenesis: I discussed with the patient that CHEK-2 encodes for a serine-threonine tyrosine kinase involved in the DNA repair in combination with ATM, BRCA1, P53 genes. Patients with mutation of the CHEK-2 gene results in the damaged DNAgetting replicated and increase the patient's risk for cancers.   Surveillance: I recommended annual mammograms and annual breast MRIs combined with monthly self breast examinations and breast examinations. Based on NCCN guidelines, there is no data to support the role of prophylactic bilateral mastectomy for CHEK-2 mutation. However given her familial history, if she chooses to undergo bilateral mastectomy it would not be unreasonable. Lifetime risk of breast cancer in vary from 28% to 38%. Higher with family history of breast cancer.   Other cancer surveillance: Apart from breast cancer, there are no definite surveillance approaches to kidney cancer. For colon cancer she had a colonoscopy and it showed polyps. since there is no family history and for thyroid cancer she will need manual thyroid examinations for any lumps or nodules. I instructed that her children should also be tested     All questions were answered. The patient knows to call the clinic with any problems, questions or concerns.    Rulon Eisenmenger, MD 03/12/17

## 2017-03-12 NOTE — Telephone Encounter (Signed)
appts scheduled per los and avs was printed for patient

## 2017-03-12 NOTE — Progress Notes (Signed)
[facility] Psychosocial Distress Screening Clinical Social Work  Clinical Social Work was referred by distress screening protocol, and physical therapist for financial concerns/needs.  The patient scored a 5 on the Psychosocial Distress Thermometer which indicates moderate distress.  Clinical Social Worker contacted patient at home to offer support and assess for needs.  Patient expressed financial concern due to recent diagnosis and increased medical bills.  CSW and patient discussed resources and organizations that offer financial assistance.  Patient plans to meet with CSW on 1/63 to review applications.  CSW encouraged patient to call with any additional questions or concerns.       ONCBCN DISTRESS SCREENING 03/04/2017  Screening Type Initial Screening  Distress experienced in past week (1-10) 5  Practical problem type Insurance  Emotional problem type Adjusting to illness  Physical Problem type Sleep/insomnia    Johnnye Lana, MSW, LCSW, OSW-C Clinical Social Worker [place] [phone]

## 2017-03-12 NOTE — Assessment & Plan Note (Signed)
02/18/2017: Right breast 1 cm mass 8 cm from nipple: biopsy 11:30 position: Grade 2 IDC with DCIS ER 100%, PR 10%, Ki-67 40%, HER-2 negative ratio 1.73; right breast 11:30 position 6 cm from nipple 0.7 cm biopsy: Fibrocystic change; T1b N0 stage IA clinical stage  Pathology and radiology counseling:Discussed with the patient, the details of pathology including the type of breast cancer,the clinical staging, the significance of ER, PR and HER-2/neu receptors and the implications for treatment. After reviewing the pathology in detail, we proceeded to discuss the different treatment options between surgery, radiation, chemotherapy, antiestrogen therapies.  Recommendations: 1. Breast conserving surgery followed by 2. Oncotype DX testing to determine if chemotherapy would be of any benefit followed by 3. Adjuvant radiation therapy followed by 4. Adjuvant antiestrogen therapy Genetics consultation will also be requested because her sister age 16 had breast cancer  Oncotype counseling: I discussed Oncotype DX test. I explained to the patient that this is a 21 gene panel to evaluate patient tumors DNA to calculate recurrence score. This would help determine whether patient has high risk or intermediate risk or low risk breast cancer. She understands that if her tumor was found to be high risk, she would benefit from systemic chemotherapy. If low risk, no need of chemotherapy. If she was found to be intermediate risk, we would need to evaluate the score as well as other risk factors and determine if an abbreviated chemotherapy may be of benefit.  Return to clinic after surgery to discuss final pathology report and then determine if Oncotype DX testing will need to be sent.

## 2017-03-12 NOTE — Assessment & Plan Note (Addendum)
CHEK 2 Mutation: I discussed with her that based of the genetics report. Based on NCCN guidelines, this is a pathogenic mutation that has been associated with risk of not only breast cancer but also colon, Prostate in men, thyroid and kidney cancers.   Pathogenesis: I discussed with the patient that CHEK-2 encodes for a serine-threonine tyrosine kinase involved in the DNA repair in combination with ATM, BRCA1, P53 genes. Patients with mutation of the CHEK-2 gene results in the damaged DNAgetting replicated and increase the patient's risk for cancers.   Surveillance: I recommended annual mammograms and annual breast MRIs combined with monthly self breast examinations and breast examinations. Based on NCCN guidelines, there is no data to support the role of prophylactic bilateral mastectomy for CHEK-2 mutation. However given her familial history, if she chooses to undergo bilateral mastectomy it would not be unreasonable. Lifetime risk of breast cancer in vary from 28% to 38%. Higher with family history of breast cancer.   Other cancer surveillance: Apart from breast cancer, there are no definite surveillance approaches to kidney cancer. For colon cancer she had a colonoscopy and it showed polyps. since there is no family history and for thyroid cancer she will need manual thyroid examinations for any lumps or nodules. I instructed that her children should also be tested

## 2017-03-13 NOTE — Progress Notes (Signed)
Pt in to pick up [facility], instructions reviewed.

## 2017-03-14 ENCOUNTER — Ambulatory Visit (HOSPITAL_BASED_OUTPATIENT_CLINIC_OR_DEPARTMENT_OTHER): Payer: BLUE CROSS/BLUE SHIELD | Admitting: Genetic Counselor

## 2017-03-14 ENCOUNTER — Encounter: Payer: Self-pay | Admitting: *Deleted

## 2017-03-14 ENCOUNTER — Encounter: Payer: Self-pay | Admitting: Genetic Counselor

## 2017-03-14 DIAGNOSIS — Z800 Family history of malignant neoplasm of digestive organs: Secondary | ICD-10-CM | POA: Diagnosis not present

## 2017-03-14 DIAGNOSIS — Z7183 Encounter for nonprocreative genetic counseling: Secondary | ICD-10-CM

## 2017-03-14 DIAGNOSIS — Z808 Family history of malignant neoplasm of other organs or systems: Secondary | ICD-10-CM

## 2017-03-14 DIAGNOSIS — C50919 Malignant neoplasm of unspecified site of unspecified female breast: Secondary | ICD-10-CM | POA: Diagnosis not present

## 2017-03-14 DIAGNOSIS — Z170 Estrogen receptor positive status [ER+]: Secondary | ICD-10-CM

## 2017-03-14 DIAGNOSIS — C50411 Malignant neoplasm of upper-outer quadrant of right female breast: Secondary | ICD-10-CM

## 2017-03-14 DIAGNOSIS — Z803 Family history of malignant neoplasm of breast: Secondary | ICD-10-CM | POA: Diagnosis not present

## 2017-03-14 DIAGNOSIS — Z1509 Genetic susceptibility to other malignant neoplasm: Principal | ICD-10-CM

## 2017-03-14 DIAGNOSIS — Z1502 Genetic susceptibility to malignant neoplasm of ovary: Principal | ICD-10-CM

## 2017-03-14 DIAGNOSIS — Z1379 Encounter for other screening for genetic and chromosomal anomalies: Secondary | ICD-10-CM

## 2017-03-14 DIAGNOSIS — Z1589 Genetic susceptibility to other disease: Principal | ICD-10-CM

## 2017-03-14 NOTE — Progress Notes (Signed)
[facility] Work  Clinical Social Work was referred by patient for assessment of psychosocial needs.  CSW met with pt, friend and her husband in [place] office to review needs and financial concerns. CSW introduced self, explained role of CSW/Pt and Family Support Team, support groups and other resources to assist. CSW reviewed various options for assistance for breast cancer patients. CSW discussed [employer] [place] and [employer], pt appears to be slightly over their income guidelines, but it could still apply based on her out of pocket medical expenses. CSW reviewed [employer] and provided Counsellor as well. Pt encouraged she may also get assistance through [facility] for OOP medical expenses if over $5000. Pt has great support from friends and family. We reviewed Support Programs and she is very interested in them and plans to attend. She will work on Location manager and plans to reach out when she needs help or when she starts radiation. CSW to assist accordingly.    Loren Racer, LCSW, OSW-C Clinical Social Worker [place]  [place] Phone: [phone] Fax: [phone]

## 2017-03-14 NOTE — Progress Notes (Addendum)
GENETIC TEST RESULTS   Patient Name: Rebecca [name] Patient Age: 61 y.o. Encounter Date: 03/14/2017  Referring Provider: Nicholas Lose, MD    Rebecca [name] was seen in the [facility] clinic on March 14, 2017 due to a personal and family history of cancer and concern regarding a hereditary predisposition to cancer in the family. Please refer to the prior Genetics clinic note for more information regarding Rebecca [name]'s medical and family histories and our assessment at the time.   FAMILY HISTORY:  We obtained a detailed, 4-generation family history.  Significant diagnoses are listed below: Family History  Problem Relation Age of Onset  . Depression Brother   . Lung cancer Brother 28       maternal half brother  . Dementia Mother        Delman Kitten  . Cancer Father 82       MELANOMA CAUSED DEATH/melanoma  . Cancer Brother        multiple myeloma/lung - maternal half brother  . Stroke Maternal Grandmother 80  . Cancer Sister 25       breast- deceased at 71 - 1/2 sister  . Breast cancer Sister   . Heart disease Maternal Grandfather   . Cancer Paternal Grandfather        COLON  . Cervical cancer Maternal Aunt   . Lung cancer Maternal Uncle   . Other Paternal Grandmother        died in childbirth  . Dementia Maternal Aunt     The patient has two sons who are cancer free.  She has four maternal half siblings, two brothers and two sisters - all are deceased.  One brother died from suicide after a diagnosis of lung cancer, and the other had multiple myeloma.  One sister was diagnosed with breast cancer at 62 and died at 24.  The patient's mother died from complications of dementia, and her father had melanoma.  The patient's mother had 12 siblings.  One sister is living with dementia, one sister had cervical cancer, and a brother had lung cancer.  The rest are cancer free.  The patient's maternal grandparents are deceased.  Her grandmother died from a stroke and her grandfather died of a  heart attack.  The patient's father had melanoma on his chest and died at 33.  He had two brothers and two sisters, none had cancer. His mother died in childbirth with him.  His father died of colon cancer.  Patient's maternal ancestors are of Caucasian and Cherokee descent, and paternal ancestors are of Caucasian descent. There is no reported Ashkenazi Jewish ancestry. There is no known consanguinity.  GENETIC TESTING:  At the time of Rebecca [name]'s visit, we recommended she pursue genetic testing of the STAT genetic test. 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 There were no deleterious mutations in  ATM, BRCA1, BRCA2, CDH1, PALB2, PTEN, STK11 and TP53.  UPDATE:CHEK2 c.349A>G has been upgraded from a Likely Pathogenic variant to a Pathogenic variant.  The change in variant classification was made as a result of re-review of the evidence in light of new variant interpretation guidelines and/or new information. The updated report date is March 03, 2020.   DISCUSSION: CHEK2 mutations have been found to be associated with an increased risk of breast and other cancers. The estimated cancer risks vary widely and may be influenced by family history. Women  with a CHEK2 deleterious mutation have approximately a 24% (no family history of breast cancer) to 48% (strong family history of breast cancer) lifetime risk of breast cancer and up to a 25% risk of a second breast cancer. Men may have an increased risk for female breast cancer of about 1%. Men and women may have an increased risk of colon cancer (~10% lifetime risk). According to the NCCN guidelines, individuals with CHEK2 mutations should consider breast MRI's as a part of regular breast cancer screening, and depending on family history could consider a risk-reducing mastectomy.  Breast cancer screening should  begin, for women, at age 53 or 73 years younger than the earliest age of onset.  Colon cancer screening should begin at age 29 and continue every 5 years or based on polyp number.    CANCER SCREENING: Below are the NCCN Practice guidelines for women and men.  However, because the breast cancer risks for women and prostate cancer risks for men may be similar, it is appropriate to consider these high risk management recommendations.  Breast Management Options We reviewed the NCCN practice guidelines (v1.2019) for breast management for women at an increased risk of breast cancer because of CHEK2 mutations:   1. Breast awareness (which may include periodic, consistent breast self exam) starting at age 27.  2. Breast screening:  . Starting at age 66, annual mammogram and breast MRI screening, or starting 10 years younger than the earliest age of onset.   Colon Cancer Management:  Men and women with a deleterious CHEK2 mutation may have up to a 10% lifetime risk for colon cancer. The following is recommended for individuals with a CHEK2 mutation:  Do not have a personal history of colon cancer and no first degree relative with colon cancer: Colonoscopy every 5 years starting at age 50.   FAMILY MEMBERS: It is important that all of Rebecca [name]'s relatives (both men and women) know of the presence of this gene mutation.  Women need to know that they may be at increased risk for breast and colon cancers.  Men are at slightly increased risk for breast, prostate and colon cancers.  Genetic testing can sort out who in your family is at risk and who is not.  We would be happy to help meet with and coordinate genetic testing for any relative that is interested.  Rebecca [name] children are at 50% risk to have inherited the mutation found in her.  Her siblings are all deceased, but their children are also at risk for carrying this mutation.  We recommend they have genetic testing for this same mutation, as  identifying the presence of this mutation would allow them to also take advantage of risk-reducing measures.   We discussed that some people do not want to undergo genetic testing due to fear of genetic discrimination.  A federal law called the Genetic Information Non-Discrimination Act (GINA) of 2008 helps protect individuals against genetic discrimination based on their genetic test results.  It impacts both health insurance and employment.  With health insurance, it protects against increased premiums, being kicked off insurance or being forced to take a test in order to be insured.  For employment it protects against hiring, firing and promoting decisions based on genetic test results.  Supplemental insurance, such as life, disability and long term care insurance are not covered under GINA.  Additionally, health status due to a cancer diagnosis is not protected under GINA.   Our knowledge of cancer risks related to  CHEK2 mutations will continue to evolve. We recommended that Ms. Matty follow up with the genetics clinic annually so we can provide her with the most current information about CHEK2 and cancer risk, as well as with any changes to her family history (new cancer diagnoses, genetic test results).  SUPPORT AND RESOURCES:  We provided information about two support groups for hereditary cancer syndrome information and support, Facing Our Risk (www.facingourrisk.com) and Bright Pink (www.brightpink.org) which some people have found useful.  They provide opportunities to speak with other individuals from high-risk families.    Our contact number was provided. Ms. Speranza questions were answered to her satisfaction, and she knows she is welcome to call us at anytime with additional questions or concerns.   Roma Kayser, MS, [facility] Certified Genetic Counselor [email or web address]

## 2017-03-17 ENCOUNTER — Encounter: Payer: Self-pay | Admitting: Genetic Counselor

## 2017-03-17 ENCOUNTER — Telehealth: Payer: Self-pay | Admitting: Genetic Counselor

## 2017-03-17 NOTE — Telephone Encounter (Signed)
I called out the remainder of the genes associated with the common hereditary cancer panel and the melanoma panel.  Patient was negative for any additional genetic mutations, outside of the originally found CHEK2 mutation.

## 2017-03-17 NOTE — Progress Notes (Signed)
Genetic testing was called out on the remainder of her panel testing.  There is no change from medical management based on previous genetic test results.  Negative genetic testing on the rest of the common hereditary cancer panel and the melanoma panel.

## 2017-03-18 ENCOUNTER — Encounter: Payer: Self-pay | Admitting: Internal Medicine

## 2017-03-18 ENCOUNTER — Ambulatory Visit
Admission: RE | Admit: 2017-03-18 | Discharge: 2017-03-18 | Disposition: A | Discharge status: Home / Self Care | Payer: BLUE CROSS/BLUE SHIELD | Source: Ambulatory Visit | Attending: General Surgery | Admitting: General Surgery

## 2017-03-18 DIAGNOSIS — C50911 Malignant neoplasm of unspecified site of right female breast: Secondary | ICD-10-CM

## 2017-03-19 ENCOUNTER — Encounter (HOSPITAL_COMMUNITY)
Admission: RE | Admit: 2017-03-19 | Discharge: 2017-03-19 | Disposition: A | Discharge status: Home / Self Care | Payer: BLUE CROSS/BLUE SHIELD | Source: Ambulatory Visit | Attending: General Surgery | Admitting: General Surgery

## 2017-03-19 ENCOUNTER — Ambulatory Visit (HOSPITAL_BASED_OUTPATIENT_CLINIC_OR_DEPARTMENT_OTHER)
Admission: RE | Admit: 2017-03-19 | Discharge: 2017-03-19 | Disposition: A | Discharge status: Home / Self Care | Payer: BLUE CROSS/BLUE SHIELD | Source: Ambulatory Visit | Attending: General Surgery | Admitting: General Surgery

## 2017-03-19 ENCOUNTER — Ambulatory Visit
Admission: RE | Admit: 2017-03-19 | Discharge: 2017-03-19 | Disposition: A | Discharge status: Home / Self Care | Payer: BLUE CROSS/BLUE SHIELD | Source: Ambulatory Visit | Attending: General Surgery | Admitting: General Surgery

## 2017-03-19 ENCOUNTER — Ambulatory Visit (HOSPITAL_BASED_OUTPATIENT_CLINIC_OR_DEPARTMENT_OTHER): Payer: BLUE CROSS/BLUE SHIELD | Admitting: Anesthesiology

## 2017-03-19 ENCOUNTER — Encounter (HOSPITAL_BASED_OUTPATIENT_CLINIC_OR_DEPARTMENT_OTHER)
Admission: RE | Disposition: A | Discharge status: Home / Self Care | Payer: Self-pay | Source: Ambulatory Visit | Attending: General Surgery

## 2017-03-19 ENCOUNTER — Encounter (HOSPITAL_BASED_OUTPATIENT_CLINIC_OR_DEPARTMENT_OTHER): Payer: Self-pay | Admitting: Anesthesiology

## 2017-03-19 DIAGNOSIS — Z803 Family history of malignant neoplasm of breast: Secondary | ICD-10-CM | POA: Insufficient documentation

## 2017-03-19 DIAGNOSIS — Z87891 Personal history of nicotine dependence: Secondary | ICD-10-CM | POA: Diagnosis not present

## 2017-03-19 DIAGNOSIS — Z170 Estrogen receptor positive status [ER+]: Secondary | ICD-10-CM | POA: Diagnosis not present

## 2017-03-19 DIAGNOSIS — E785 Hyperlipidemia, unspecified: Secondary | ICD-10-CM | POA: Diagnosis not present

## 2017-03-19 DIAGNOSIS — F329 Major depressive disorder, single episode, unspecified: Secondary | ICD-10-CM | POA: Diagnosis not present

## 2017-03-19 DIAGNOSIS — Z79899 Other long term (current) drug therapy: Secondary | ICD-10-CM | POA: Diagnosis not present

## 2017-03-19 DIAGNOSIS — C50411 Malignant neoplasm of upper-outer quadrant of right female breast: Secondary | ICD-10-CM | POA: Diagnosis not present

## 2017-03-19 DIAGNOSIS — C50911 Malignant neoplasm of unspecified site of right female breast: Secondary | ICD-10-CM

## 2017-03-19 HISTORY — PX: BREAST LUMPECTOMY WITH RADIOACTIVE SEED AND SENTINEL LYMPH NODE BIOPSY: SHX6550

## 2017-03-19 HISTORY — DX: Dyspnea, unspecified: R06.00

## 2017-03-19 SURGERY — BREAST LUMPECTOMY WITH RADIOACTIVE SEED AND SENTINEL LYMPH NODE BIOPSY
Anesthesia: General | Site: Breast | Laterality: Right

## 2017-03-19 MED ORDER — CEFAZOLIN SODIUM-DEXTROSE 2-4 GM/100ML-% IV SOLN
INTRAVENOUS | Status: AC
  Filled 2017-03-19: qty 100

## 2017-03-19 MED ORDER — HYDROCODONE-ACETAMINOPHEN 5-325 MG PO TABS: 1.0000 | ORAL_TABLET | ORAL | 0 refills | Status: DC | PRN

## 2017-03-19 MED ORDER — ACETAMINOPHEN 500 MG PO TABS
ORAL_TABLET | ORAL | Status: AC
  Filled 2017-03-19: qty 2

## 2017-03-19 MED ORDER — SCOPOLAMINE 1 MG/3DAYS TD PT72
MEDICATED_PATCH | TRANSDERMAL | Status: AC
  Filled 2017-03-19: qty 1

## 2017-03-19 MED ORDER — MEPERIDINE HCL 25 MG/ML IJ SOLN: 6.2500 mg | INTRAMUSCULAR | Status: DC | PRN

## 2017-03-19 MED ORDER — MIDAZOLAM HCL 2 MG/2ML IJ SOLN
INTRAMUSCULAR | Status: AC
  Filled 2017-03-19: qty 2

## 2017-03-19 MED ORDER — FENTANYL CITRATE (PF) 100 MCG/2ML IJ SOLN
INTRAMUSCULAR | Status: AC
  Filled 2017-03-19: qty 2

## 2017-03-19 MED ORDER — CHLORHEXIDINE GLUCONATE CLOTH 2 % EX PADS: 6.0000 | MEDICATED_PAD | Freq: Once | CUTANEOUS | Status: DC

## 2017-03-19 MED ORDER — METOCLOPRAMIDE HCL 5 MG/ML IJ SOLN: 10.0000 mg | Freq: Once | INTRAMUSCULAR | Status: DC | PRN

## 2017-03-19 MED ORDER — EPHEDRINE 5 MG/ML INJ
INTRAVENOUS | Status: AC
  Filled 2017-03-19: qty 10

## 2017-03-19 MED ORDER — TECHNETIUM TC 99M SULFUR COLLOID FILTERED
1.0000 | Freq: Once | INTRAVENOUS | Status: AC | PRN
  Administered 2017-03-19: 1 via INTRADERMAL

## 2017-03-19 MED ORDER — PHENYLEPHRINE 40 MCG/ML (10ML) SYRINGE FOR IV PUSH (FOR BLOOD PRESSURE SUPPORT)
PREFILLED_SYRINGE | INTRAVENOUS | Status: AC
  Filled 2017-03-19: qty 10

## 2017-03-19 MED ORDER — ONDANSETRON 4 MG PO TBDP
4.0000 mg | ORAL_TABLET | Freq: Once | ORAL | Status: AC | PRN
  Administered 2017-03-19: 4 mg via ORAL

## 2017-03-19 MED ORDER — PHENYLEPHRINE HCL 10 MG/ML IJ SOLN
INTRAMUSCULAR | Status: DC | PRN
  Administered 2017-03-19 (×3): 80 ug via INTRAVENOUS

## 2017-03-19 MED ORDER — METHYLENE BLUE 0.5 % INJ SOLN
INTRAVENOUS | Status: AC
  Filled 2017-03-19: qty 10

## 2017-03-19 MED ORDER — GABAPENTIN 300 MG PO CAPS
300.0000 mg | ORAL_CAPSULE | ORAL | Status: AC
  Administered 2017-03-19: 300 mg via ORAL

## 2017-03-19 MED ORDER — GABAPENTIN 300 MG PO CAPS
ORAL_CAPSULE | ORAL | Status: AC
  Filled 2017-03-19: qty 1

## 2017-03-19 MED ORDER — SODIUM CHLORIDE 0.9 % IJ SOLN
INTRAMUSCULAR | Status: AC
  Filled 2017-03-19: qty 10

## 2017-03-19 MED ORDER — FENTANYL CITRATE (PF) 100 MCG/2ML IJ SOLN
25.0000 ug | INTRAMUSCULAR | Status: DC | PRN
  Administered 2017-03-19 (×2): 50 ug via INTRAVENOUS

## 2017-03-19 MED ORDER — LIDOCAINE 2% (20 MG/ML) 5 ML SYRINGE
INTRAMUSCULAR | Status: AC
  Filled 2017-03-19: qty 5

## 2017-03-19 MED ORDER — ONDANSETRON 4 MG PO TBDP
ORAL_TABLET | ORAL | Status: AC
  Filled 2017-03-19: qty 1

## 2017-03-19 MED ORDER — PROPOFOL 10 MG/ML IV BOLUS
INTRAVENOUS | Status: DC | PRN
  Administered 2017-03-19: 150 mg via INTRAVENOUS

## 2017-03-19 MED ORDER — ACETAMINOPHEN 500 MG PO TABS
1000.0000 mg | ORAL_TABLET | ORAL | Status: AC
  Administered 2017-03-19: 1000 mg via ORAL

## 2017-03-19 MED ORDER — LIDOCAINE HCL (CARDIAC) 20 MG/ML IV SOLN
INTRAVENOUS | Status: DC | PRN
  Administered 2017-03-19: 30 mg via INTRAVENOUS

## 2017-03-19 MED ORDER — BUPIVACAINE-EPINEPHRINE (PF) 0.5% -1:200000 IJ SOLN
INTRAMUSCULAR | Status: DC | PRN
  Administered 2017-03-19: 30 mL via PERINEURAL

## 2017-03-19 MED ORDER — FENTANYL CITRATE (PF) 100 MCG/2ML IJ SOLN
INTRAMUSCULAR | Status: DC | PRN
  Administered 2017-03-19 (×3): 25 ug via INTRAVENOUS
  Administered 2017-03-19: 100 ug via INTRAVENOUS

## 2017-03-19 MED ORDER — EPHEDRINE SULFATE 50 MG/ML IJ SOLN
INTRAMUSCULAR | Status: DC | PRN
  Administered 2017-03-19: 10 mg via INTRAVENOUS

## 2017-03-19 MED ORDER — BUPIVACAINE-EPINEPHRINE (PF) 0.5% -1:200000 IJ SOLN
INTRAMUSCULAR | Status: DC | PRN
  Administered 2017-03-19: 15 mL

## 2017-03-19 MED ORDER — FENTANYL CITRATE (PF) 100 MCG/2ML IJ SOLN
50.0000 ug | INTRAMUSCULAR | Status: DC | PRN
  Administered 2017-03-19 (×2): 50 ug via INTRAVENOUS

## 2017-03-19 MED ORDER — ONDANSETRON HCL 4 MG/2ML IJ SOLN
INTRAMUSCULAR | Status: AC
  Filled 2017-03-19: qty 2

## 2017-03-19 MED ORDER — MIDAZOLAM HCL 2 MG/2ML IJ SOLN
1.0000 mg | INTRAMUSCULAR | Status: DC | PRN
  Administered 2017-03-19: 2 mg via INTRAVENOUS

## 2017-03-19 MED ORDER — LACTATED RINGERS IV SOLN
INTRAVENOUS | Status: DC
  Administered 2017-03-19 (×3): via INTRAVENOUS

## 2017-03-19 MED ORDER — MIDAZOLAM HCL 5 MG/5ML IJ SOLN
INTRAMUSCULAR | Status: DC | PRN
  Administered 2017-03-19: 2 mg via INTRAVENOUS

## 2017-03-19 MED ORDER — BUPIVACAINE-EPINEPHRINE (PF) 0.5% -1:200000 IJ SOLN
INTRAMUSCULAR | Status: AC
  Filled 2017-03-19: qty 30

## 2017-03-19 MED ORDER — HYDROCODONE-ACETAMINOPHEN 7.5-325 MG PO TABS: 1.0000 | ORAL_TABLET | Freq: Once | ORAL | Status: DC | PRN

## 2017-03-19 MED ORDER — PROPOFOL 10 MG/ML IV BOLUS
INTRAVENOUS | Status: AC
  Filled 2017-03-19: qty 20

## 2017-03-19 MED ORDER — SCOPOLAMINE 1 MG/3DAYS TD PT72
1.0000 | MEDICATED_PATCH | Freq: Once | TRANSDERMAL | Status: DC | PRN
  Administered 2017-03-19: 1.5 mg via TRANSDERMAL

## 2017-03-19 MED ORDER — DEXAMETHASONE SODIUM PHOSPHATE 10 MG/ML IJ SOLN
INTRAMUSCULAR | Status: AC
  Filled 2017-03-19: qty 1

## 2017-03-19 MED ORDER — ONDANSETRON HCL 4 MG/2ML IJ SOLN
INTRAMUSCULAR | Status: DC | PRN
  Administered 2017-03-19: 4 mg via INTRAVENOUS

## 2017-03-19 MED ORDER — CEFAZOLIN SODIUM-DEXTROSE 2-4 GM/100ML-% IV SOLN
2.0000 g | INTRAVENOUS | Status: AC
  Administered 2017-03-19: 2 g via INTRAVENOUS

## 2017-03-19 SURGICAL SUPPLY — 49 items
BINDER BREAST LRG (GAUZE/BANDAGES/DRESSINGS) IMPLANT
BINDER BREAST MEDIUM (GAUZE/BANDAGES/DRESSINGS) IMPLANT
BINDER BREAST XLRG (GAUZE/BANDAGES/DRESSINGS) ×2 IMPLANT
BINDER BREAST XXLRG (GAUZE/BANDAGES/DRESSINGS) IMPLANT
BLADE SURG 15 STRL LF DISP TIS (BLADE) ×1 IMPLANT
BLADE SURG 15 STRL SS (BLADE) ×1
CANISTER SUC SOCK COL 7IN (MISCELLANEOUS) IMPLANT
CANISTER SUCT 1200ML W/VALVE (MISCELLANEOUS) ×2 IMPLANT
CHLORAPREP W/TINT 26ML (MISCELLANEOUS) ×2 IMPLANT
CLIP VESOCCLUDE SM WIDE 6/CT (CLIP) ×4 IMPLANT
COVER BACK TABLE 60X90IN (DRAPES) ×2 IMPLANT
COVER MAYO STAND STRL (DRAPES) ×2 IMPLANT
COVER PROBE W GEL 5X96 (DRAPES) ×2 IMPLANT
DECANTER SPIKE VIAL GLASS SM (MISCELLANEOUS) IMPLANT
DERMABOND ADVANCED (GAUZE/BANDAGES/DRESSINGS) ×1
DERMABOND ADVANCED .7 DNX12 (GAUZE/BANDAGES/DRESSINGS) ×1 IMPLANT
DEVICE DUBIN W/COMP PLATE 8390 (MISCELLANEOUS) ×2 IMPLANT
DRAPE LAPAROSCOPIC ABDOMINAL (DRAPES) ×2 IMPLANT
DRAPE UTILITY XL STRL (DRAPES) ×2 IMPLANT
ELECT COATED BLADE 2.86 ST (ELECTRODE) ×2 IMPLANT
ELECT REM PT RETURN 9FT ADLT (ELECTROSURGICAL) ×2
ELECTRODE REM PT RTRN 9FT ADLT (ELECTROSURGICAL) ×1 IMPLANT
GLOVE BIO SURGEON STRL SZ 6.5 (GLOVE) ×4 IMPLANT
GLOVE BIOGEL PI IND STRL 8 (GLOVE) IMPLANT
GLOVE BIOGEL PI INDICATOR 8 (GLOVE)
GLOVE ECLIPSE 7.5 STRL STRAW (GLOVE) ×2 IMPLANT
GLOVE SURG SIGNA 7.5 PF LTX (GLOVE) IMPLANT
GLOVE SURG SS PI 7.5 STRL IVOR (GLOVE) ×2 IMPLANT
GOWN STRL REUS W/ TWL LRG LVL3 (GOWN DISPOSABLE) ×1 IMPLANT
GOWN STRL REUS W/ TWL XL LVL3 (GOWN DISPOSABLE) ×1 IMPLANT
GOWN STRL REUS W/TWL LRG LVL3 (GOWN DISPOSABLE) ×1
GOWN STRL REUS W/TWL XL LVL3 (GOWN DISPOSABLE) ×1
ILLUMINATOR WAVEGUIDE N/F (MISCELLANEOUS) ×2 IMPLANT
KIT MARKER MARGIN INK (KITS) ×2 IMPLANT
NDL SAFETY ECLIPSE 18X1.5 (NEEDLE) IMPLANT
NEEDLE HYPO 18GX1.5 SHARP (NEEDLE)
NEEDLE HYPO 25X1 1.5 SAFETY (NEEDLE) ×2 IMPLANT
NS IRRIG 1000ML POUR BTL (IV SOLUTION) IMPLANT
PACK BASIN DAY SURGERY FS (CUSTOM PROCEDURE TRAY) ×2 IMPLANT
PENCIL BUTTON HOLSTER BLD 10FT (ELECTRODE) ×2 IMPLANT
SLEEVE SCD COMPRESS KNEE MED (MISCELLANEOUS) ×2 IMPLANT
SPONGE LAP 4X18 X RAY DECT (DISPOSABLE) ×4 IMPLANT
SUT MON AB 5-0 PS2 18 (SUTURE) ×2 IMPLANT
SUT VICRYL 3-0 CR8 SH (SUTURE) ×4 IMPLANT
SYR CONTROL 10ML LL (SYRINGE) ×2 IMPLANT
TOWEL OR 17X24 6PK STRL BLUE (TOWEL DISPOSABLE) ×2 IMPLANT
TOWEL OR NON WOVEN STRL DISP B (DISPOSABLE) ×2 IMPLANT
TUBE CONNECTING 20X1/4 (TUBING) ×2 IMPLANT
YANKAUER SUCT BULB TIP NO VENT (SUCTIONS) ×2 IMPLANT

## 2017-03-19 NOTE — Op Note (Signed)
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 After extensive preoperative workup and discussion detailed elsewhere we have elected to proceed with radioactive seed localized lumpectomy and right axillary sentinel lymph node biopsy as initial surgical treatment.    Procedure Detail:  Patient had previously undergone accurate placement of a radioactive seed at the tumor and clip site in the upper left breast at the 11:30 position about 8 cm from the nipple. In the holding area she underwent injection of 1 mCi of technetium sulfur colloid intradermally around the right nipple. She was taken to the operating room, placed in the supine position on the operating table and laryngeal mask general anesthesia induced. PAS were in place. She received preoperative IV antibiotics. Patient timeout was performed and correct procedure verified. The lumpectomy was approached initially. The seed was localized quite high in the right breast. I used a circumareolar incision superiorly and dissection carried down into the subcutaneous tissue. Using the Invuity lighted retractor I then raised a skin and subcutaneous flap over the superior breast in the area of the seed. There was a small palpable abnormality as well. Using the neoprobe for guidance I excised a generous globular lumpectomy with the cautery and this was taken down to the chest wall including the fascia as the mass was fairly deep. The lumpectomy specimen was marked with ink and specimen x-ray showed the seed and marking clip centrally  contained within the specimen. The wound was irrigated and hemostasis assured. The lumpectomy cavity was marked with clips. Breast tissue was rotated into the cavity and closed with interrupted 3-0 Vicryl. Subcutaneous was closed with interrupted 3-0 Vicryl. Attention was turned to the axillary node biopsy. A hot area in the right axilla was localized with the neoprobe and a small transverse incision made. Dissection was carried down through the subcutaneous tissue and the clavipectoral fascia incised. Using the neoprobe for guidance I dissected down onto a deep axillary node which was slightly enlarged but soft. It was exposed and completely excised with cautery. Ex vivo the node had counts in excess of 500. Careful examination of the axilla with the neoprobe did not reveal any residual counts and I could not feel any adenopathy. Hemostasis was assured and the deep axillary and subcutaneous tissue was closed with interrupted 3-0 Vicryl. Skin on both incisions was closed with subcuticular 5-0 Monocryl after infiltrating with local anesthesia. Sponge needle and instrument counts were correct.    Findings: As above  Estimated Blood Loss:  Minimal         Drains: None  Blood Given: none          Specimens: #1 right breast lumpectomy oriented with ink   #2 hot right axillary sentinel lymph node        Complications:  * No complications entered in OR log *         Disposition: PACU - hemodynamically stable.         Condition: stable

## 2017-03-19 NOTE — Progress Notes (Signed)
Assisted Dr. Foster with right, ultrasound guided, pectoralis block. Side rails up, monitors on throughout procedure. See vital signs in flow sheet. Tolerated Procedure well. 

## 2017-03-19 NOTE — Anesthesia Preprocedure Evaluation (Signed)
Anesthesia Evaluation  Patient identified by MRN, date of birth, ID band Patient awake    Reviewed: Allergy & Precautions, NPO status , Patient's Chart, lab work & pertinent test results  Airway Mallampati: II  TM Distance: >3 FB Neck ROM: Full    Dental no notable dental hx. (+) Teeth Intact   Pulmonary shortness of breath, former smoker,    Pulmonary exam normal breath sounds clear to auscultation       Cardiovascular negative cardio ROS Normal cardiovascular exam Rhythm:Regular Rate:Normal     Neuro/Psych  Headaches, PSYCHIATRIC DISORDERS Depression    GI/Hepatic negative GI ROS, (+) Hepatitis -  Endo/Other  Right breast Ca Hyperlipidemia  Renal/GU negative Renal ROS  negative genitourinary   Musculoskeletal negative musculoskeletal ROS (+)   Abdominal   Peds  Hematology negative hematology ROS (+)   Anesthesia Other Findings   Reproductive/Obstetrics                             Anesthesia Physical Anesthesia Plan  ASA: II  Anesthesia Plan: General   Post-op Pain Management:  Regional for Post-op pain   Induction: Intravenous  PONV Risk Score and Plan: 3 and Ondansetron, Propofol, Midazolam and Treatment may vary due to age or medical condition  Airway Management Planned: LMA  Additional Equipment:   Intra-op Plan:   Post-operative Plan: Extubation in OR  Informed Consent: I have reviewed the patients History and Physical, chart, labs and discussed the procedure including the risks, benefits and alternatives for the proposed anesthesia with the patient or authorized representative who has indicated his/her understanding and acceptance.   Dental advisory given  Plan Discussed with: CRNA, Anesthesiologist and Surgeon  Anesthesia Plan Comments:         Anesthesia Quick Evaluation

## 2017-03-19 NOTE — Interval H&P Note (Signed)
History and Physical Interval Note:  03/19/2017 10:44 AM  Rebecca [name]  has presented today for surgery, with the diagnosis of cancer right breast  The various methods of treatment have been discussed with the patient and family. After consideration of risks, benefits and other options for treatment, the patient has consented to  Procedure(s): RIGHT BREAST LUMPECTOMY WITH RADIOACTIVE SEED AND SENTINEL LYMPH NODE BIOPSY (Right) as a surgical intervention .  The patient's history has been reviewed, patient examined, no change in status, stable for surgery.  I have reviewed the patient's chart and labs.  Questions were answered to the patient's satisfaction.     Zaiah Eckerson T

## 2017-03-19 NOTE — Anesthesia Procedure Notes (Signed)
Anesthesia Regional Block: Pectoralis block   Pre-Anesthetic Checklist: ,, timeout performed, Correct Patient, Correct Site, Correct Laterality, Correct Procedure, Correct Position, site marked, Risks and benefits discussed,  Surgical consent,  Pre-op evaluation,  At surgeon's request and post-op pain management  Laterality: Right  Prep: chloraprep       Needles:  Injection technique: Single-shot  Needle Type: Echogenic Stimulator Needle     Needle Length: 9cm  Needle Gauge: 21   Needle insertion depth: 6 cm   Additional Needles:   Procedures: ultrasound guided,,,,,,,,  Narrative:  Start time: 03/19/2017 9:58 AM End time: 03/19/2017 10:04 AM Injection made incrementally with aspirations every 5 mL.  Performed by: Personally  Anesthesiologist: Josephine Igo  Additional Notes: Timeout performed. Patient sedated. Relevant anatomy ID'd using [place]. Incremental 2-76ml injection of LA with frequent aspiration. Patient tolerated procedure well.

## 2017-03-19 NOTE — Anesthesia Postprocedure Evaluation (Signed)
Anesthesia Post Note  Patient: Rebecca [name]  Procedure(s) Performed: Procedure(s) (LRB): RIGHT BREAST LUMPECTOMY WITH RADIOACTIVE SEED AND SENTINEL LYMPH NODE BIOPSY (Right)     Patient location during evaluation: PACU Anesthesia Type: General Level of consciousness: awake and alert Pain management: pain level controlled Vital Signs Assessment: post-procedure vital signs reviewed and stable Respiratory status: spontaneous breathing, nonlabored ventilation and respiratory function stable Cardiovascular status: blood pressure returned to baseline and stable Postop Assessment: no signs of nausea or vomiting Anesthetic complications: no    Last Vitals:  Vitals:   03/19/17 1300 03/19/17 1315  BP: 134/65 138/69  Pulse: 72 80  Resp: 11 15  Temp:      Last Pain:  Vitals:   03/19/17 1315  TempSrc:   PainSc: 6                  Jethro Radke A.

## 2017-03-19 NOTE — Discharge Instructions (Signed)
[place] Office Phone Number [phone]  BREAST BIOPSY/ LUMPECTOMY: POST OP INSTRUCTIONS  Always review your discharge instruction sheet given to you by the facility where your surgery was performed.  IF YOU HAVE DISABILITY OR FAMILY LEAVE FORMS, YOU MUST BRING THEM TO THE OFFICE FOR PROCESSING.  DO NOT GIVE THEM TO YOUR DOCTOR.  1. A prescription for pain medication may be given to you upon discharge.  Take your pain medication as prescribed, if needed.  If narcotic pain medicine is not needed, then you may take acetaminophen (Tylenol) or ibuprofen (Advil) as needed. 2. Take your usually prescribed medications unless otherwise directed 3. If you need a refill on your pain medication, please contact your pharmacy.  They will contact our office to request authorization.  Prescriptions will not be filled after 5pm or on week-ends. 4. You should eat very light the first 24 hours after surgery, such as soup, crackers, pudding, etc.  Resume your normal diet the day after surgery. 5. Most patients will experience some swelling and bruising in the breast.  Ice packs and a good support bra will help.  Swelling and bruising can take several days to resolve.  6. It is common to experience some constipation if taking pain medication after surgery.  Increasing fluid intake and taking a stool softener will usually help or prevent this problem from occurring.  A mild laxative (Milk of Magnesia or Miralax) should be taken according to package directions if there are no bowel movements after 48 hours. 7. Unless discharge instructions indicate otherwise, you may remove your bandages 24-48 hours after surgery, and you may shower at that time.  You may have steri-strips (small skin tapes) in place directly over the incision.  These strips should be left on the skin for 7-10 days.  If your surgeon used skin glue on the incision, you may shower in 24 hours.  The glue will flake off over the next 2-3  weeks.  Any sutures or staples will be removed at the office during your follow-up visit. 8. ACTIVITIES:  You may resume regular daily activities (gradually increasing) beginning the next day.  Wearing a good support bra or sports bra minimizes pain and swelling.  You may have sexual intercourse when it is comfortable. a. You may drive when you no longer are taking prescription pain medication, you can comfortably wear a seatbelt, and you can safely maneuver your car and apply brakes. b. RETURN TO WORK:  ______________________________________________________________________________________ 9. You should see your doctor in the office for a follow-up appointment approximately two weeks after your surgery.  Your doctors nurse will typically make your follow-up appointment when she calls you with your pathology report.  Expect your pathology report 2-3 business days after your surgery.  You may call to check if you do not hear from [place] after three days. OTHER INSTRUCTIONS: __  WHEN TO CALL YOUR DOCTOR: 1. Fever over 101.0 2. Nausea and/or vomiting. 3. Extreme swelling or bruising. 4. Continued bleeding from incision. 5. Increased pain, redness, or drainage from the incision.  The clinic staff is available to answer your questions during regular business hours.  Please dont hesitate to call and ask to speak to one of the nurses for clinical concerns.  If you have a medical emergency, go to the nearest emergency room or call 911.  A surgeon from [facility] is always on call at the hospital.  For further questions, please visit [email or web address]  Instructions  Activity: Get plenty of rest for the remainder of the day. A responsible individual must stay with you for 24 hours following the procedure.  For the next 24 hours, DO NOT: -Drive a car -Paediatric nurse -Drink alcoholic beverages -Take any medication unless instructed by your  physician -Make any legal decisions or sign important papers.  Meals: Start with liquid foods such as gelatin or soup. Progress to regular foods as tolerated. Avoid greasy, spicy, heavy foods. If nausea and/or vomiting occur, drink only clear liquids until the nausea and/or vomiting subsides. Call your physician if vomiting continues.  Special Instructions/Symptoms: Your throat may feel dry or sore from the anesthesia or the breathing tube placed in your throat during surgery. If this causes discomfort, gargle with warm salt water. The discomfort should disappear within 24 hours.  If you had a scopolamine patch placed behind your ear for the management of post- operative nausea and/or vomiting:  1. The medication in the patch is effective for 72 hours, after which it should be removed.  Wrap patch in a tissue and discard in the trash. Wash hands thoroughly with soap and water. 2. You may remove the patch earlier than 72 hours if you experience unpleasant side effects which may include dry mouth, dizziness or visual disturbances. 3. Avoid touching the patch. Wash your hands with soap and water after contact with the patch.

## 2017-03-19 NOTE — H&P (Signed)
History of Present Illness Marland Kitchen T. Emmer Lillibridge MD; 02/20/2017 11:45 AM) The patient is a 61 year old female who presents with breast cancer. She is a post menopausal female referred by Dr. Augustin Coupe for evaluation of recently diagnosed carcinoma of the right breast. She recently presented for a screening mamogram revealing a new mass in the upper right breast. Subsequent imaging included diagnostic mamogram showing an irregular mass with spiculated margins measuring approximately 2 cm in the far posterior upper right breast and ultrasound showing an irregular hypoechoic mass with indistinct margins measuring 1.0 x 0.7 cm at the 11:30 position 8 cm from the nipple as well as 2 cm closer to the nipple two adjacent hypoechoic masses measuring 0.7 cm. An ultrasound guided breast biopsy was performed on February 18, 2017 with pathology revealing invasive ductal carcinoma of the breast involving the larger more suspicious mass and fibrocystic changes in the smaller left suspicious area. She is seen now in the office for initial treatment planning. She has experienced no breast symptoms, specifically lump or pain, skin changes or nipple discharge. She does not have a personal history of any previous breast problems. She has a significant family history of her half sister on her mother's side dying from breast cancer in her mid 21s.  Findings at that time were the following: Tumor size: 1 cm Tumor grade: 2 Prognostic panel subsequently showed ER and PR positive Lymph node status: Negative    Past Surgical History Jerrye Bushy, RMA; 02/20/2017 11:11 AM) Colon Polyp Removal - Colonoscopy  Knee Surgery  Right.  Diagnostic Studies History Jerrye Bushy, [place]; 02/20/2017 11:11 AM) Colonoscopy  1-5 years ago Mammogram  within last year Pap Smear  1-5 years ago  Allergies Jerrye Bushy, [place]; 02/20/2017 11:16 AM) Citalopram Hydrobromide *ANTIDEPRESSANTS*  Dexamethasone Base *CORTICOSTEROIDS*  Propoxyphene  N-Acetaminophen *ANALGESICS - OPIOID*  Red Yeast Rice *ALTERNATIVE MEDICINES*  Rofecoxib *ANALGESICS - ANTI-INFLAMMATORY*  Sulfamethizole *SULFONAMIDES*  Allergies Reconciled   Medication History Jerrye Bushy, [place]; 02/20/2017 11:17 AM) No Current Medications Medications Reconciled  Pregnancy / Birth History Jerrye Bushy, [place]; 02/20/2017 11:11 AM) Age at menarche  59 years. Age of menopause  42-60 Gravida  2 Irregular periods  Length (months) of breastfeeding  3-6 Maternal age  86-25 Para  2  Other Problems Jerrye Bushy, [place]; 02/20/2017 11:11 AM) Breast Cancer  Hepatitis  Lump In Breast     Review of Systems Basilia Jumbo Rogers RMA; 02/20/2017 11:11 AM) General Not Present- Appetite Loss, Chills, Fatigue, Fever, Night Sweats, Weight Gain and Weight Loss. Skin Not Present- Change in Wart/Mole, Dryness, Hives, Jaundice, New Lesions, Non-Healing Wounds, Rash and Ulcer. HEENT Not Present- Earache, Hearing Loss, Hoarseness, Nose Bleed, Oral Ulcers, Ringing in the Ears, Seasonal Allergies, Sinus Pain, Sore Throat, Visual Disturbances, Wears glasses/contact lenses and Yellow Eyes. Respiratory Not Present- Bloody sputum, Chronic Cough, Difficulty Breathing, Snoring and Wheezing. Breast Present- Breast Mass. Not Present- Breast Pain, Nipple Discharge and Skin Changes. Cardiovascular Not Present- Chest Pain, Difficulty Breathing Lying Down, Leg Cramps, Palpitations, Rapid Heart Rate, Shortness of Breath and Swelling of Extremities. Gastrointestinal Not Present- Abdominal Pain, Bloating, Bloody Stool, Change in Bowel Habits, Chronic diarrhea, Constipation, Difficulty Swallowing, Excessive gas, Gets full quickly at meals, Hemorrhoids, Indigestion, Nausea, Rectal Pain and Vomiting. Female Genitourinary Not Present- Frequency, Nocturia, Painful Urination, Pelvic Pain and Urgency. Musculoskeletal Present- Muscle Pain. Not Present- Back Pain, Joint Pain, Joint Stiffness, Muscle Weakness and  Swelling of Extremities. Neurological Present- Headaches. Not Present- Decreased Memory, Fainting, Numbness, Seizures, Tingling, Tremor, Trouble  walking and Weakness. Psychiatric Not Present- Anxiety, Bipolar, Change in Sleep Pattern, Depression, Fearful and Frequent crying. Endocrine Not Present- Cold Intolerance, Excessive Hunger, Hair Changes, Heat Intolerance, Hot flashes and New Diabetes. Hematology Not Present- Blood Thinners, Easy Bruising, Excessive bleeding, Gland problems, HIV and Persistent Infections.  Vitals [employer] Rogers RMA; 02/20/2017 11:13 AM) 02/20/2017 11:12 AM Weight: 164 lb Height: 66in Body Surface Area: 1.84 m Body Mass Index: 26.47 kg/m  Temp.: 97.50F  Pulse: 74 (Regular)  P.OX: 98% (Room air) BP: 140/88 (Sitting, Left Arm, Standard)       Physical Exam Marland Kitchen T. Tarisha Fader MD; 02/20/2017 11:47 AM) The physical exam findings are as follows: Note:General: Alert, well-developed and well nourished Caucasian female, in no distress Skin: Warm and dry without rash or infection. HEENT: No palpable masses or thyromegaly. Sclera nonicteric. Pupils equal round and reactive. Lymph nodes: No cervical, supraclavicular, nodes palpable. Breasts: In the upper right breast at about the 11:30 position is an area of bruising and slight thickening post biopsy. No other palpable abnormalities in either breast. No skin changes or nipple discharge. Lungs: Breath sounds clear and equal. No wheezing or increased work of breathing. Cardiovascular: Regular rate and rhythm without murmer. No JVD or edema. Peripheral pulses intact. No carotid bruits. Abdomen: Nondistended. Soft and nontender. No masses palpable. No organomegaly. No palpable hernias. Extremities: No edema or joint swelling or deformity. No chronic venous stasis changes. Neurologic: Alert and fully oriented. Gait normal. No focal weakness. Psychiatric: Normal mood and affect. Thought content appropriate with normal  judgement and insight    Assessment & Plan Marland Kitchen T. Deby Adger MD; 02/20/2017 12:12 PM) MALIGNANT NEOPLASM OF RIGHT BREAST, STAGE 1, UNSPECIFIED ESTROGEN RECEPTOR STATUS (C50.911) Impression: 61 year old female with a new diagnosis of cancer of the right breast, upper outer quadrant. Clinical stage 1A, prognostic panel pending. I discussed with the patient and family members present today initial surgical treatment options. We discussed options of breast conservation with lumpectomy or total mastectomy and sentinal lymph node biopsy/dissection. Options for reconstruction were discussed. After discussion they have elected to proceed with breast conservation with radioactive seed localized lumpectomy and axillary sentinel lymph node biopsy. We discussed the indications and nature of the procedure, and expected recovery, in detail. Surgical risks including anesthetic complications, cardiorespiratory complications, bleeding, infection, wound healing complications, blood clots, lymphedema, local and distant recurrence and possible need for further surgery based on the final pathology was discussed and understood. Chemotherapy, hormonal therapy and radiation therapy have been discussed. They have been provided with literature regarding the treatment of breast cancer. All questions were answered. They understand and agree to proceed and we will go ahead with scheduling. We discussed that she may be a candidate for genetic testing due to her sister's breast cancer. We discussed that she might want to consider prophylactic mastectomy if she were genetically positive and therefore we will schedule her surgery out far enough that we have the results of the genetic test back. We also discussed that if she were to have a hormone negative tumor and required chemotherapy we could consider possibly neoadjuvant treatment. We will however tentatively plan to proceed with rest conservation surgery as above. Current  Plans Referred to Genetic Counseling, for evaluation and follow up (Medical Genetics). Routine. Referred to Oncology, for evaluation and follow up (Oncology). Routine. Referred to Radiation Oncology, for evaluation and follow up (Radiation Oncology). Routine. Referred to Physical Therapy, for evaluation and follow up (Physical Therapy). Routine. Radioactive seed localized right breast lumpectomy  and right axillary sentinel lymph node biopsy under general anesthesia as an outpatient

## 2017-03-19 NOTE — Anesthesia Procedure Notes (Signed)
Procedure Name: LMA Insertion Date/Time: 03/19/2017 10:55 AM Performed by: Toula Moos L Pre-anesthesia Checklist: Patient identified, Emergency Drugs available, Suction available, Patient being monitored and Timeout performed Patient Re-evaluated:Patient Re-evaluated prior to induction Oxygen Delivery Method: Circle system utilized Preoxygenation: Pre-oxygenation with 100% oxygen Induction Type: IV induction Ventilation: Mask ventilation without difficulty LMA: LMA inserted LMA Size: 4.0 Number of attempts: 1 Airway Equipment and Method: Bite block Placement Confirmation: positive ETCO2 Tube secured with: Tape Dental Injury: Teeth and Oropharynx as per pre-operative assessment

## 2017-03-19 NOTE — Progress Notes (Signed)
Nuc med inj performed by nuc med staff. Pt tol well with additional sedation. Will call family to bedside and update/provide emotional support

## 2017-03-19 NOTE — Transfer of Care (Signed)
Immediate Anesthesia Transfer of Care Note  Patient: Rebecca [name]  Procedure(s) Performed: Procedure(s): RIGHT BREAST LUMPECTOMY WITH RADIOACTIVE SEED AND SENTINEL LYMPH NODE BIOPSY (Right)  Patient Location: PACU  Anesthesia Type:GA combined with regional for post-op pain  Level of Consciousness: awake and patient cooperative  Airway & Oxygen Therapy: Patient Spontanous Breathing and Patient connected to face mask oxygen  Post-op Assessment: Report given to RN and Post -op Vital signs reviewed and stable  Post vital signs: Reviewed and stable  Last Vitals:  Vitals:   03/19/17 1015 03/19/17 1020  BP: 121/65 115/69  Pulse: 71 72  Resp: 16 15  Temp:      Last Pain:  Vitals:   03/19/17 0926  TempSrc: Oral         Complications: No apparent anesthesia complications

## 2017-03-20 ENCOUNTER — Encounter: Payer: Self-pay | Admitting: Internal Medicine

## 2017-03-26 ENCOUNTER — Encounter: Payer: Self-pay | Admitting: Hematology and Oncology

## 2017-03-26 ENCOUNTER — Ambulatory Visit (HOSPITAL_BASED_OUTPATIENT_CLINIC_OR_DEPARTMENT_OTHER): Payer: BLUE CROSS/BLUE SHIELD | Admitting: Hematology and Oncology

## 2017-03-26 ENCOUNTER — Telehealth: Payer: Self-pay | Admitting: *Deleted

## 2017-03-26 DIAGNOSIS — Z170 Estrogen receptor positive status [ER+]: Secondary | ICD-10-CM | POA: Diagnosis not present

## 2017-03-26 DIAGNOSIS — C50411 Malignant neoplasm of upper-outer quadrant of right female breast: Secondary | ICD-10-CM

## 2017-03-26 NOTE — Telephone Encounter (Signed)
Received order per Dr. Gudena for oncotype testing. Requisition sent to pathology. Received by Keisha 

## 2017-03-26 NOTE — Progress Notes (Signed)
Patient Care Team: [name], MD as PCP - General (Internal Medicine) [name], MD (Internal Medicine) Arta Silence, MD as Consulting Physician (Gastroenterology) Arvella Nigh, MD as Consulting Physician (Obstetrics and Gynecology)  DIAGNOSIS:  Encounter Diagnosis  Name Primary?  . 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)   02/18/2017 Initial Diagnosis    Right breast 1 cm mass 8 cm from nipple: biopsy 11:30 position: Grade 2 IDC with DCIS ER 100%, PR 10%, Ki-67 40%, HER-2 negative ratio 1.73; right breast 11:30 position 6 cm from nipple 0.7 cm biopsy: Fibrocystic change; T1b N0 stage IA clinical stage      03/09/2017 Genetic Testing    CHEK2 c.349A>G 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  The STAT Breast cancer panel offered by [facility] includes sequencing and rearrangement analysis for the following 9 genes:  ATM, BRCA1, BRCA2, CDH1, CHEK2, PALB2, PTEN, STK11 and TP53.   The report date is March 09, 2017.  Re-requitisioned to the common hereditary cancer panel and the melanoma panel.  The additional genes tested were negative.  The Hereditary Gene Panel offered by [facility] includes sequencing and/or deletion duplication testing of the following 46 genes: APC, ATM, AXIN2, BARD1, BMPR1A, BRCA1, BRCA2, BRIP1, CDH1, CDKN2A (p14ARF), CDKN2A (p16INK4a), CHEK2, CTNNA1, DICER1, EPCAM (Deletion/duplication testing only), GREM1 (promoter region deletion/duplication testing only), KIT, MEN1, MLH1, MSH2, MSH3, MSH6, MUTYH, NBN, NF1, NHTL1, PALB2, PDGFRA, PMS2, POLD1, POLE, PTEN, RAD50, RAD51C, RAD51D, SDHB, SDHC, SDHD, SMAD4, SMARCA4. STK11, TP53, TSC1, TSC2, and VHL.  The following  genes were evaluated for sequence changes only: SDHA and HOXB13 c.251G>A variant only.  The Melanoma panel offered by [facility] includes sequencing and/or deletion duplication testing of the following 12 genes: BAP1, BRCA1, BRCA2, BRIP1, CDK4, CDKN2A (p14ARF), CDKN2A (p16INK4a), MC1R, POT1, PTEN, RB1, TERT, and TP53.  The following gene was evaluated for sequence changes only: MITF (c.952G>A, p.GLU318Lys variant only). The updated report date is March 14, 2017.        03/19/2017 Surgery    Right lumpectomy: IDC with DCIS, 1.2 cm, grade 2, margins negative, 0/2 lymph nodes negative, ER 100%, PR 10%, HER-2 negative ratio 1.73, Ki-67 40% T1BN0 stage IA       CHIEF COMPLIANT: Follow-up after recent lumpectomy  INTERVAL HISTORY: Rebecca [name] is a 61 year old with above-mentioned history of right breast cancer treated with lumpectomy and is here today to discuss the pathology report. She is doing remarkably well. She does not have any pain or discomfort.  REVIEW OF SYSTEMS:   Constitutional: Denies fevers, chills or abnormal weight loss Eyes: Denies blurriness of vision Ears, nose, mouth, throat, and face: Denies mucositis or sore throat Respiratory: Denies cough, dyspnea or wheezes Cardiovascular: Denies palpitation, chest discomfort Gastrointestinal:  Denies nausea, heartburn or change in bowel habits Skin: Denies abnormal skin rashes Lymphatics: Denies new lymphadenopathy or easy bruising Neurological:Denies numbness, tingling or new weaknesses Behavioral/Psych: Mood is stable, no new changes  Extremities: No lower extremity edema Breast:  Right lumpectomy All other systems were reviewed with the patient and are negative.  I have reviewed the past medical history, past surgical history, social history and family history with the patient and they are unchanged from previous note.  ALLERGIES:  is allergic to citalopram; citalopram; dexamethasone; dexamethasone; dilaudid [hydromorphone hcl];  propoxycaine; red yeast rice [cholestin]; rofecoxib; and sulfamethoxazole.  MEDICATIONS:  No current outpatient prescriptions on file.   No current facility-administered medications for this visit.     PHYSICAL EXAMINATION: ECOG PERFORMANCE STATUS: 0 - Asymptomatic  Vitals:   03/26/17 0958  BP: (!) 141/76  Pulse: 63  Resp: 18  Temp: 97.9 F (36.6 C)   Filed Weights   03/26/17 0958  Weight: 164 lb 9.6 oz (74.7 kg)    GENERAL:alert, no distress and comfortable SKIN: skin color, texture, turgor are normal, no rashes or significant lesions EYES: normal, Conjunctiva are pink and non-injected, sclera clear OROPHARYNX:no exudate, no erythema and lips, buccal mucosa, and tongue normal  NECK: supple, thyroid normal size, non-tender, without nodularity LYMPH:  no palpable lymphadenopathy in the cervical, axillary or inguinal LUNGS: clear to auscultation and percussion with normal breathing effort HEART: regular rate & rhythm and no murmurs and no lower extremity edema ABDOMEN:abdomen soft, non-tender and normal bowel sounds MUSCULOSKELETAL:no cyanosis of digits and no clubbing  NEURO: alert & oriented x 3 with fluent speech, no focal motor/sensory deficits EXTREMITIES: No lower extremity edema  LABORATORY DATA:  I have reviewed the data as listed   Chemistry      Component Value Date/Time   NA 141 10/08/2016 1201   K 4.2 10/08/2016 1201   CL 104 10/08/2016 1201   CO2 28 10/08/2016 1201   BUN 16 10/08/2016 1201   CREATININE 0.64 10/08/2016 1201      Component Value Date/Time   CALCIUM 8.9 10/08/2016 1201   ALKPHOS 58 10/08/2016 1201   AST 17 10/08/2016 1201   ALT 15 10/08/2016 1201   BILITOT 0.6 10/08/2016 1201       Lab Results  Component Value Date   WBC 4.1 10/08/2016   HGB 13.5 10/08/2016   HCT 40.8 10/08/2016   MCV 89.9 10/08/2016   PLT 169 10/08/2016   NEUTROABS 2,214 10/08/2016    ASSESSMENT & PLAN:  Carcinoma of upper-outer quadrant of right breast  in female, estrogen receptor positive ([place]) 03/19/2017: Right lumpectomy: IDC with DCIS, 1.2 cm, grade 2, margins negative, 0/2 lymph nodes negative, ER 100%, PR 10%, HER-2 negative ratio 1.73, Ki-67 40% T1BN0 stage IA CHEK 2 Mutation  Treatment plan: 1. Oncotype DX testing to determine if chemotherapy would be of any benefit followed by 2. Adjuvant radiation therapy followed by 3. Adjuvant antiestrogen therapy  Oncotype counseling: I discussed Oncotype DX test. I explained to the patient that this is a 21 gene panel to evaluate patient tumors DNA to calculate recurrence score. This would help determine whether patient has high risk or intermediate risk or low risk breast cancer. She understands that if her tumor was found to be high risk, she would benefit from systemic chemotherapy. If low risk, no need of chemotherapy. If she was found to be intermediate risk, we would need to evaluate the score as well as other risk factors and determine if an abbreviated chemotherapy may be of benefit.  Return to clinic based upon Oncotype DX testing  Hematuria: Patient was complaining of frequent blood in the urine and hasn't appointment to see urology in September. She had a previous history of stone. I recommended that we obtain a CT abdomen and pelvis with and without contrast. Patient wants to investigate the cost before she agrees to undergo CT scans. She will look to see if there would be any cheaper place for scans.  I spent 25 minutes talking to the patient of which more than half was spent in counseling and coordination of care.  No orders of the defined types were placed in this encounter.  The patient has a good understanding of the overall plan. she agrees with it. she will call with any problems that may develop before the next visit here.   Rulon Eisenmenger, MD 03/26/17

## 2017-03-26 NOTE — Assessment & Plan Note (Signed)
03/19/2017: Right lumpectomy: IDC with DCIS, 1.2 cm, grade 2, margins negative, 0/2 lymph nodes negative, ER 100%, PR 10%, HER-2 negative ratio 1.73, Ki-67 40% T1BN0 stage IA CHEK 2 Mutation  Treatment plan: 1. Oncotype DX testing to determine if chemotherapy would be of any benefit followed by 2. Adjuvant radiation therapy followed by 3. Adjuvant antiestrogen therapy  Return to clinic based upon Oncotype DX testing

## 2017-03-27 ENCOUNTER — Other Ambulatory Visit: Payer: Self-pay

## 2017-03-27 DIAGNOSIS — C50411 Malignant neoplasm of upper-outer quadrant of right female breast: Secondary | ICD-10-CM

## 2017-03-27 DIAGNOSIS — R109 Unspecified abdominal pain: Secondary | ICD-10-CM

## 2017-03-27 DIAGNOSIS — Z170 Estrogen receptor positive status [ER+]: Secondary | ICD-10-CM

## 2017-04-02 ENCOUNTER — Telehealth: Payer: Self-pay | Admitting: *Deleted

## 2017-04-02 ENCOUNTER — Encounter: Payer: Self-pay | Admitting: *Deleted

## 2017-04-02 NOTE — Telephone Encounter (Signed)
Received oncotype score of 41/28%. Physician team notified. Called pt and discussed results. Scheduled and confirmed appt with Dr. Lindi Adie on 8/9 at 1145. Denies questions at this time.

## 2017-04-02 NOTE — Progress Notes (Signed)
[employer] Work  Clinical Social Work was referred by patient for follow up due to financial concerns.  Pt shared she may have to start chemo and had questions about financial applications. Clinical Social Worker reviewed applications via phone with patient and encouraged her to return applications once her treatment plan is decided. Pt aware she can bring applications and leave with Doyne Keel as needed. CSW to follow and assist accordingly.    Clinical Social Work interventions:  Resource education and referral  Loren Racer, LCSW, OSW-C Clinical Social Worker [place]  [place] Phone: [phone] Fax: [phone]

## 2017-04-03 ENCOUNTER — Encounter: Payer: Self-pay | Admitting: *Deleted

## 2017-04-03 ENCOUNTER — Other Ambulatory Visit: Payer: Self-pay | Admitting: *Deleted

## 2017-04-03 ENCOUNTER — Ambulatory Visit (HOSPITAL_BASED_OUTPATIENT_CLINIC_OR_DEPARTMENT_OTHER): Payer: BLUE CROSS/BLUE SHIELD | Admitting: Hematology and Oncology

## 2017-04-03 VITALS — BP 137/81 | HR 84 | Temp 98.0°F | Resp 18 | Ht 66.5 in | Wt 164.3 lb

## 2017-04-03 DIAGNOSIS — Z170 Estrogen receptor positive status [ER+]: Secondary | ICD-10-CM | POA: Diagnosis not present

## 2017-04-03 DIAGNOSIS — C50411 Malignant neoplasm of upper-outer quadrant of right female breast: Secondary | ICD-10-CM

## 2017-04-03 DIAGNOSIS — Z1509 Genetic susceptibility to other malignant neoplasm: Principal | ICD-10-CM

## 2017-04-03 DIAGNOSIS — Z1589 Genetic susceptibility to other disease: Principal | ICD-10-CM

## 2017-04-03 DIAGNOSIS — C50919 Malignant neoplasm of unspecified site of unspecified female breast: Secondary | ICD-10-CM

## 2017-04-03 DIAGNOSIS — Z1502 Genetic susceptibility to malignant neoplasm of ovary: Principal | ICD-10-CM

## 2017-04-03 MED ORDER — LIDOCAINE-PRILOCAINE 2.5-2.5 % EX CREA: TOPICAL_CREAM | CUTANEOUS | 3 refills | Status: DC

## 2017-04-03 MED ORDER — DEXAMETHASONE 4 MG PO TABS: 4.0000 mg | ORAL_TABLET | Freq: Every day | ORAL | 0 refills | Status: DC

## 2017-04-03 MED ORDER — PROCHLORPERAZINE MALEATE 10 MG PO TABS: 10.0000 mg | ORAL_TABLET | Freq: Four times a day (QID) | ORAL | 1 refills | Status: DC | PRN

## 2017-04-03 MED ORDER — ONDANSETRON HCL 8 MG PO TABS: 8.0000 mg | ORAL_TABLET | Freq: Two times a day (BID) | ORAL | 1 refills | Status: DC | PRN

## 2017-04-03 MED ORDER — LORAZEPAM 0.5 MG PO TABS: 0.5000 mg | ORAL_TABLET | Freq: Every day | ORAL | 0 refills | Status: DC

## 2017-04-03 NOTE — Progress Notes (Signed)
Patient Care Team: [name], MD as PCP - General (Internal Medicine) [name], MD (Internal Medicine) Arta Silence, MD as Consulting Physician (Gastroenterology) Arvella Nigh, MD as Consulting Physician (Obstetrics and Gynecology)  DIAGNOSIS:  Encounter Diagnoses  Name Primary?  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  The STAT Breast cancer panel offered by [facility] includes sequencing and rearrangement analysis for the following 9 genes:  ATM, BRCA1, BRCA2, CDH1, CHEK2, PALB2, PTEN, STK11 and TP53.   The report date is March 09, 2017.  Re-requitisioned to the common hereditary cancer panel and the melanoma panel.  The additional genes tested were negative.  The Hereditary Gene Panel offered by [facility] includes sequencing and/or deletion duplication testing of the following 46 genes: APC, ATM, AXIN2, BARD1, BMPR1A, BRCA1, BRCA2, BRIP1, CDH1, CDKN2A (p14ARF), CDKN2A (p16INK4a), CHEK2, CTNNA1, DICER1, EPCAM (Deletion/duplication testing only), GREM1 (promoter region deletion/duplication testing only), KIT, MEN1, MLH1, MSH2, MSH3, MSH6, MUTYH, NBN, NF1, NHTL1, PALB2, PDGFRA, PMS2, POLD1, POLE, PTEN, RAD50, RAD51C, RAD51D, SDHB, SDHC, SDHD, SMAD4, SMARCA4. STK11,  TP53, TSC1, TSC2, and VHL.  The following genes were evaluated for sequence changes only: SDHA and HOXB13 c.251G>A variant only.  The Melanoma panel offered by [facility] includes sequencing and/or deletion duplication testing of the following 12 genes: BAP1, BRCA1, BRCA2, BRIP1, CDK4, CDKN2A (p14ARF), CDKN2A (p16INK4a), MC1R, POT1, PTEN, RB1, TERT, and TP53.  The following gene was evaluated for sequence changes only: MITF (c.952G>A, p.GLU318Lys variant only). The updated report date is March 14, 2017.        03/19/2017 Surgery    Right lumpectomy: IDC with DCIS, 1.2 cm, grade 2, margins negative, 0/2 lymph nodes negative, ER 100%, PR 10%, HER-2 negative ratio 1.73, Ki-67 40% T1BN0 stage IA      04/01/2017 Oncotype testing    Oncotype Dx score: 41 (28% ROR with Tamoxifen alone)       CHIEF COMPLIANT: Follow-up to discuss Oncotype DX test result  INTERVAL HISTORY: Laurieann Friddle is a 61 year old with above-mentioned history of right breast cancer treated with lumpectomy and underwent Oncotype DX testing. She is here today to discuss Oncotype test results. She has recovered very well from the surgery. She was also noted to have a Chek 2 mutation.  REVIEW OF SYSTEMS:   Constitutional: Denies fevers, chills or abnormal weight loss Eyes: Denies blurriness of vision Ears, nose, mouth, throat, and face: Denies mucositis or sore throat Respiratory: Denies cough, dyspnea or wheezes Cardiovascular: Denies palpitation, chest discomfort Gastrointestinal:  Denies nausea, heartburn or change in bowel habits Skin: Denies abnormal skin rashes Lymphatics: Denies new lymphadenopathy or easy bruising Neurological:Denies numbness, tingling or new weaknesses Behavioral/Psych: Mood is stable, no new changes  Extremities: No lower extremity edema Breast: Recovered from  recent surgery All other systems were reviewed with the patient and are negative.  I have reviewed the past medical history, past surgical  history, social history and family history with the patient and they are unchanged from previous note.  ALLERGIES:  is allergic to citalopram; citalopram; dexamethasone; dexamethasone; dilaudid [hydromorphone hcl]; propoxycaine; red yeast rice [cholestin]; rofecoxib; and sulfamethoxazole.  MEDICATIONS:  No current outpatient prescriptions on file.   No current facility-administered medications for this visit.     PHYSICAL EXAMINATION: ECOG PERFORMANCE STATUS: 1 - Symptomatic but completely ambulatory  Vitals:   04/03/17 1210  BP: 137/81  Pulse: 84  Resp: 18  Temp: 98 F (36.7 C)  SpO2: 98%   Filed Weights   04/03/17 1210  Weight: 164 lb 4.8 oz (74.5 kg)    GENERAL:alert, no distress and comfortable SKIN: skin color, texture, turgor are normal, no rashes or significant lesions EYES: normal, Conjunctiva are pink and non-injected, sclera clear OROPHARYNX:no exudate, no erythema and lips, buccal mucosa, and tongue normal  NECK: supple, thyroid normal size, non-tender, without nodularity LYMPH:  no palpable lymphadenopathy in the cervical, axillary or inguinal LUNGS: clear to auscultation and percussion with normal breathing effort HEART: regular rate & rhythm and no murmurs and no lower extremity edema ABDOMEN:abdomen soft, non-tender and normal bowel sounds MUSCULOSKELETAL:no cyanosis of digits and no clubbing  NEURO: alert & oriented x 3 with fluent speech, no focal motor/sensory deficits EXTREMITIES: No lower extremity edema  LABORATORY DATA:  I have reviewed the data as listed   Chemistry      Component Value Date/Time   NA 141 10/08/2016 1201   K 4.2 10/08/2016 1201   CL 104 10/08/2016 1201   CO2 28 10/08/2016 1201   BUN 16 10/08/2016 1201   CREATININE 0.64 10/08/2016 1201      Component Value Date/Time   CALCIUM 8.9 10/08/2016 1201   ALKPHOS 58 10/08/2016 1201   AST 17 10/08/2016 1201   ALT 15 10/08/2016 1201   BILITOT 0.6 10/08/2016 1201       Lab  Results  Component Value Date   WBC 4.1 10/08/2016   HGB 13.5 10/08/2016   HCT 40.8 10/08/2016   MCV 89.9 10/08/2016   PLT 169 10/08/2016   NEUTROABS 2,214 10/08/2016    ASSESSMENT & PLAN:  Carcinoma of upper-outer quadrant of right breast in female, estrogen receptor positive ([place]) 03/19/2017: Right lumpectomy: IDC with DCIS, 1.2 cm, grade 2, margins negative, 0/2 lymph nodes negative, ER 100%, PR 10%, HER-2 negative ratio 1.73, Ki-67 40% T1BN0 stage IA CHEK 2 Mutation Positive Oncotype Dx score: 41 (28% ROR with Tamoxifen alone)  Oncotype Counseling: Given the high recurrence score, I recommended systemic chemo with dose dense adriamycin and cytoxan followed by weekly taxol X 12  Chemotherapy Counseling: I discussed the risks and benefits of chemotherapy including the risks of nausea/ vomiting, risk of infection from low WBC count, fatigue due to chemo or anemia, bruising or bleeding due to low platelets, mouth sores, loss/ change in taste and decreased appetite. Liver and kidney function will be monitored through out chemotherapy as abnormalities in liver and kidney function may be a side effect of treatment. Cardiac dysfunction due to Adriamycin was discussed in detail. Risk of permanent bone marrow dysfunction and leukemia due to chemo were also discussed.  Hematuria: CT abdomen pelvis done at [facility] did not reveal any abnormalities. She did have 4 lesions which were suspicious for hemangiomas. She has no appointment with urology in September.  Port, echo, chemo class will be scheduled RTC in 2 weeks to start chemo   I spent 25 minutes talking to the patient of which more than half was spent in counseling and coordination of care.  No orders of the defined types were placed in this encounter.  The patient has a good understanding of the overall plan. she agrees with it. she will call with any problems that may develop before the next visit here.   Rulon Eisenmenger,  MD 04/03/17

## 2017-04-03 NOTE — Assessment & Plan Note (Signed)
03/19/2017: Right lumpectomy: IDC with DCIS, 1.2 cm, grade 2, margins negative, 0/2 lymph nodes negative, ER 100%, PR 10%, HER-2 negative ratio 1.73, Ki-67 40% T1BN0 stage IA CHEK 2 Mutation Positive Oncotype Dx score: 41 (28% ROR with Tamoxifen alone)  Oncotype Counseling: Given the high recurrence score, I recommended systemic chemo with dose dense adriamycin and cytoxan followed by weekly taxol X 12  Chemotherapy Counseling: I discussed the risks and benefits of chemotherapy including the risks of nausea/ vomiting, risk of infection from low WBC count, fatigue due to chemo or anemia, bruising or bleeding due to low platelets, mouth sores, loss/ change in taste and decreased appetite. Liver and kidney function will be monitored through out chemotherapy as abnormalities in liver and kidney function may be a side effect of treatment. Cardiac dysfunction due to Adriamycin was discussed in detail. Risk of permanent bone marrow dysfunction and leukemia due to chemo were also discussed.  Hematuria:  Port, echo, chemo class will be scheduled RTC in 2 weeks to start chemo

## 2017-04-03 NOTE — Progress Notes (Signed)
START ON PATHWAY REGIMEN - Breast   Doxorubicin + Cyclophosphamide [facility]):   A cycle is every 21 days:     Doxorubicin      Cyclophosphamide   **Always confirm dose/schedule in your pharmacy ordering system**    Paclitaxel 80 mg/m2 Weekly:   Administer weekly:     Paclitaxel   **Always confirm dose/schedule in your pharmacy ordering system**    Patient Characteristics: Postoperative without Neoadjuvant Therapy (Pathologic Staging), Invasive Disease, Adjuvant Therapy, Node Negative, HER2 Negative/Unknown/Equivocal, ER Positive, Oncotype High Risk (>= 31) Therapeutic Status: Postoperative without Neoadjuvant Therapy (Pathologic Staging) AJCC Grade: G2 AJCC N Category: pN0 AJCC M Category: cM0 ER Status: Positive (+) AJCC 8 Stage Grouping: IA HER2 Status: Negative (-) Oncotype Dx Recurrence Score: 41 AJCC T Category: pT1b PR Status: Positive (+) Has this patient completed genomic testing? Yes - Oncotype DX(R) Intent of Therapy: Curative Intent, Discussed with Patient

## 2017-04-04 ENCOUNTER — Encounter (HOSPITAL_COMMUNITY): Payer: Self-pay

## 2017-04-04 NOTE — Progress Notes (Signed)
Patient ID: Rebecca [name], female   DOB: 11-Nov-1955, 61 y.o.   MRN: [id number]  Complete Physical  Assessment and Plan:  Hyperlipidemia, unspecified hyperlipidemia type -     TSH -     Lipid panel -     EKG 12-Lead -continue medications, check lipids, decrease fatty foods, increase activity.   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[place]) Continue folllow up  Carcinoma of upper-outer quadrant of right breast in female, estrogen receptor positive ([place]) Continue folllow up, if she needs anything call the office  Genetic testing Continue folllow up  Routine general medical examination at a health care facility  Screening for hematuria or proteinuria -     Urinalysis, Routine w reflex microscopic -     Microalbumin / creatinine urine ratio  Screening for deficiency anemia -     Iron and TIBC -     Vitamin B12  Vitamin D deficiency -     VITAMIN D 25 Hydroxy (Vit-D Deficiency, Fractures)  Medication management -     CBC with Differential/Platelet -     Hepatic function panel -     BASIC METABOLIC PANEL WITH GFR -     Magnesium  Screening for viral disease -     Hepatitis C antibody -     HIV antibody    Discussed med's effects and SE's. Screening labs and tests as requested with regular follow-up as recommended. Future Appointments Date Time Provider [place]  04/09/2017 9:00 AM WL- ECHO 1-RUTH [facility]-CARDUS [facility]  04/09/2017 10:00 AM [facility] CHEMO EDU [facility] None  04/16/2017 10:00 AM [facility] LAB 3 [facility] None  04/16/2017 10:30 AM [facility] FLUSH NURSE 2 [facility] None  04/16/2017 10:45 AM Nicholas Lose, MD [facility] None  04/16/2017 11:45 AM [facility] A3 [facility] None  04/23/2017 10:45 AM [facility] LAB 5 [facility] None  04/23/2017 11:00 AM [facility] FLUSH NURSE [facility] None  04/23/2017 11:45 AM Nicholas Lose, MD [facility] None  04/30/2017 11:15 AM [facility] LAB 6  [facility] None  04/30/2017 11:30 AM [facility] FLUSH NURSE 2 [facility] None  04/30/2017 12:30 PM [facility] G24 [facility] None  05/14/2017 11:15 AM [facility] LAB 4 [facility] None  05/14/2017 11:30 AM [facility] FLUSH NURSE [facility] None  05/14/2017 12:00 PM [facility] C10 [facility] None  08/07/2017 8:45 AM Vicie Mutters, PA-C [facility] None  04/08/2018 9:00 AM Vicie Mutters, PA-C [facility] None    HPI  61 y.o. female  presents for a complete physical.  Her blood pressure has been controlled at home, today their BP is  .  She does workout. She denies chest pain, shortness of breath, dizziness.   She had a Right lumpectomy on 03/19/2017 with Rebecca [name]: IDC with DCIS, 1.2 cm, grade 2, margins negative, 0/2 lymph nodes negative, ER 100%, PR 10%, HER-2 negative ratio 1.73, Ki-67 40% T1BN0 stage IA and will start systemic chemo on the 22nd and getting port on Monday, adriamycin and cytoxan followed by weekly taxol X 12, follows with [facility], Rebecca [name]. Getting echo next week.   Had had hematuria, had referral to urology but Rebecca [name] did CT AB/pelvis that we have not received the results from. Will request. Will occ have fleeting right flank pain.   She is on cholesterol medication and denies myalgias. Her cholesterol is at goal. The cholesterol last visit was:  Lab Results  Component Value Date   CHOL 223 (H) 03/20/2016   HDL 73 03/20/2016   [place]  135 (H) 03/20/2016   TRIG 77 03/20/2016   CHOLHDL 3.1 03/20/2016  . She has been working on diet and exercise for prediabetes,  and denies foot ulcerations, hyperglycemia, hypoglycemia , increased appetite, nausea, paresthesia of the feet, polydipsia, polyuria, visual disturbances, vomiting and weight loss. Last A1C in the office was:  Lab Results  Component Value Date   HGBA1C 5.5 03/20/2016   Patient is on Vitamin D supplement.   Lab Results  Component Value Date   VD25OH 33 03/20/2016     BMI is There  is no height or weight on file to calculate BMI., she is working on diet and exercise. Wt Readings from Last 3 Encounters:  04/03/17 164 lb 4.8 oz (74.5 kg)  03/26/17 164 lb 9.6 oz (74.7 kg)  03/19/17 163 lb (73.9 kg)     Current Medications:  Current Outpatient Prescriptions on File Prior to Visit  Medication Sig Dispense Refill  . dexamethasone (DECADRON) 4 MG tablet Take 1 tablet (4 mg total) by mouth daily. Take 1 tablet with food daily for 3 days starting day after chemotherapy 12 tablet 0  . lidocaine-prilocaine (EMLA) cream Apply to affected area once 30 g 3  . LORazepam (ATIVAN) 0.5 MG tablet Take 1 tablet (0.5 mg total) by mouth at bedtime. As needed 30 tablet 0  . ondansetron (ZOFRAN) 8 MG tablet Take 1 tablet (8 mg total) by mouth 2 (two) times daily as needed. Start on the third day after chemotherapy. 30 tablet 1  . prochlorperazine (COMPAZINE) 10 MG tablet Take 1 tablet (10 mg total) by mouth every 6 (six) hours as needed (Nausea or vomiting). 30 tablet 1   No current facility-administered medications on file prior to visit.     Health Maintenance:   Immunization History  Administered Date(s) Administered  . Td 08/27/2007   Tetanus: 2009 Flu vaccine: Pap: July 2018 [facility]  01/2017 for + right breast cancer DEXA: Rebecca [name] 2011 Colonoscopy: 2014, [place] Last Eye Exam: Eye doctor  Patient Care Team: [name], MD as PCP - General (Internal Medicine) [name], MD (Internal Medicine) Arta Silence, MD as Consulting Physician (Gastroenterology) Arvella Nigh, MD as Consulting Physician (Obstetrics and Gynecology)  Medical History:  Past Medical History:  Diagnosis Date  . Depression    REMISSION/ SITUATIONAL  . Dyspnea   . Family history of brain cancer   . Family history of breast cancer   . Family history of colon cancer   . Family history of melanoma   . Hepatitis AGE 77   RESOLVED  . Hyperlipidemia   . Insomnia   . Migraines     HORMONAL  . Shingles outbreak 2003, 2015   RIGHT HIP, and between breasts 3 years ago    Allergies Allergies  Allergen Reactions  . Citalopram Other (See Comments)    HYPERACTIVITY  . Citalopram     Hyper  . Dexamethasone Other (See Comments)    CHEST TIGHTNESS  . Dexamethasone     Chest tightness  . Dilaudid [Hydromorphone Hcl] Nausea And Vomiting  . Propoxycaine Nausea And Vomiting  . Red Yeast Rice [Cholestin] Other (See Comments)    headache  . Rofecoxib Nausea And Vomiting  . Sulfamethoxazole Swelling    SURGICAL HISTORY She  has a past surgical history that includes Knee arthroscopy (Right, 2002); Tubal ligation; uterine polyps (2016); and Breast lumpectomy with radioactive seed and sentinel lymph node biopsy (Right, 03/19/2017). FAMILY HISTORY Her family history includes Breast cancer in her sister;  Cancer in her brother and paternal grandfather; Cancer (age of onset: 44) in her sister; Cancer (age of onset: 35) in her father; Cervical cancer in her maternal aunt; Dementia in her maternal aunt and mother; Depression in her brother; Heart disease in her maternal grandfather; Lung cancer in her maternal uncle; Lung cancer (age of onset: 25) in her brother; Other in her paternal grandmother; Stroke (age of onset: 10) in her maternal grandmother. SOCIAL HISTORY She  reports that she quit smoking about 8 years ago. She has never used smokeless tobacco. She reports that she does not drink alcohol or use drugs. 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    Review of Systems: Review of Systems  Constitutional: Negative for chills, fever and malaise/fatigue.  HENT: Negative for congestion, ear pain and sore throat.   Eyes: Negative.   Respiratory: Negative for cough, shortness of breath and wheezing.   Cardiovascular: Negative for chest pain, palpitations and leg swelling.  Gastrointestinal: Negative for abdominal pain, blood in stool, constipation, diarrhea, heartburn  and melena.  Genitourinary: Negative.   Skin: Negative.   Neurological: Negative for dizziness, sensory change, loss of consciousness and headaches.  Psychiatric/Behavioral: Negative for depression, hallucinations, substance abuse and suicidal ideas. The patient is nervous/anxious. The patient does not have insomnia.     Physical Exam: Estimated body mass index is 26.12 kg/m as calculated from the following:   Height as of 04/03/17: 5' 6.5" (1.689 m).   Weight as of 04/03/17: 164 lb 4.8 oz (74.5 kg). LMP 08/03/2013   General Appearance: Well nourished well developed, in no apparent distress.  Eyes: PERRLA, EOMs, conjunctiva no swelling or erythema ENT/Mouth: Ear canals normal without obstruction, swelling, erythema, or discharge.  TMs normal bilaterally with no erythema, bulging, retraction, or loss of landmark.  Oropharynx moist and clear with no exudate, erythema, or swelling.   Neck: Supple, thyroid normal. No bruits.  No cervical adenopathy Respiratory: Respiratory effort normal, Breath sounds clear A&P without wheeze, rhonchi, rales.   Cardio: RRR without murmurs, rubs or gallops. Brisk peripheral pulses without edema.  Chest: symmetric, with normal excursions Abdomen: Soft, nontender, no guarding, rebound, hernias, masses, or organomegaly.  Lymphatics: Non tender without lymphadenopathy.  Musculoskeletal: Full ROM all peripheral extremities,5/5 strength, and normal gait.  Skin: Warm, dry without rashes, lesions, ecchymosis. Neuro: Awake and oriented X 3, Cranial nerves intact, reflexes equal bilaterally. Normal muscle tone, no cerebellar symptoms. Sensation intact.  Psych:  normal affect, Insight and Judgment appropriate.   Over 40 minutes of exam, counseling, chart review and critical decision making was performed  EKG WNL, no ST changes  Vicie Mutters 12:36 PM [facility]

## 2017-04-07 ENCOUNTER — Telehealth: Payer: Self-pay | Admitting: *Deleted

## 2017-04-07 ENCOUNTER — Encounter: Payer: Self-pay | Admitting: Physician Assistant

## 2017-04-07 ENCOUNTER — Ambulatory Visit (INDEPENDENT_AMBULATORY_CARE_PROVIDER_SITE_OTHER): Payer: BLUE CROSS/BLUE SHIELD | Admitting: Physician Assistant

## 2017-04-07 VITALS — BP 130/70 | HR 67 | Temp 97.7°F | Resp 14 | Ht 66.0 in | Wt 162.0 lb

## 2017-04-07 DIAGNOSIS — C50919 Malignant neoplasm of unspecified site of unspecified female breast: Secondary | ICD-10-CM

## 2017-04-07 DIAGNOSIS — G43809 Other migraine, not intractable, without status migrainosus: Secondary | ICD-10-CM

## 2017-04-07 DIAGNOSIS — Z136 Encounter for screening for cardiovascular disorders: Secondary | ICD-10-CM | POA: Diagnosis not present

## 2017-04-07 DIAGNOSIS — E559 Vitamin D deficiency, unspecified: Secondary | ICD-10-CM | POA: Diagnosis not present

## 2017-04-07 DIAGNOSIS — Z1509 Genetic susceptibility to other malignant neoplasm: Secondary | ICD-10-CM

## 2017-04-07 DIAGNOSIS — Z1329 Encounter for screening for other suspected endocrine disorder: Secondary | ICD-10-CM

## 2017-04-07 DIAGNOSIS — Z0000 Encounter for general adult medical examination without abnormal findings: Secondary | ICD-10-CM | POA: Diagnosis not present

## 2017-04-07 DIAGNOSIS — Z1379 Encounter for other screening for genetic and chromosomal anomalies: Secondary | ICD-10-CM

## 2017-04-07 DIAGNOSIS — E785 Hyperlipidemia, unspecified: Secondary | ICD-10-CM

## 2017-04-07 DIAGNOSIS — Z1589 Genetic susceptibility to other disease: Secondary | ICD-10-CM

## 2017-04-07 DIAGNOSIS — Z1502 Genetic susceptibility to malignant neoplasm of ovary: Secondary | ICD-10-CM

## 2017-04-07 DIAGNOSIS — Z1159 Encounter for screening for other viral diseases: Secondary | ICD-10-CM | POA: Diagnosis not present

## 2017-04-07 DIAGNOSIS — Z1389 Encounter for screening for other disorder: Secondary | ICD-10-CM

## 2017-04-07 DIAGNOSIS — Z130 Encounter for screening for diseases of the blood and blood-forming organs and certain disorders involving the immune mechanism: Secondary | ICD-10-CM

## 2017-04-07 DIAGNOSIS — I10 Essential (primary) hypertension: Secondary | ICD-10-CM

## 2017-04-07 DIAGNOSIS — C50411 Malignant neoplasm of upper-outer quadrant of right female breast: Secondary | ICD-10-CM

## 2017-04-07 DIAGNOSIS — Z79899 Other long term (current) drug therapy: Secondary | ICD-10-CM | POA: Diagnosis not present

## 2017-04-07 DIAGNOSIS — Z131 Encounter for screening for diabetes mellitus: Secondary | ICD-10-CM

## 2017-04-07 DIAGNOSIS — Z170 Estrogen receptor positive status [ER+]: Secondary | ICD-10-CM

## 2017-04-07 LAB — CBC WITH DIFFERENTIAL/PLATELET
BASOS ABS: 49 {cells}/uL (ref 0–200)
Basophils Relative: 1 %
EOS ABS: 49 {cells}/uL (ref 15–500)
Eosinophils Relative: 1 %
HCT: 40.7 % (ref 35.0–45.0)
HEMOGLOBIN: 13.7 g/dL (ref 11.7–15.5)
LYMPHS ABS: 1568 {cells}/uL (ref 850–3900)
Lymphocytes Relative: 32 %
MCH: 30.4 pg (ref 27.0–33.0)
MCHC: 33.7 g/dL (ref 32.0–36.0)
MCV: 90.4 fL (ref 80.0–100.0)
MPV: 10.7 fL (ref 7.5–12.5)
Monocytes Absolute: 343 cells/uL (ref 200–950)
Monocytes Relative: 7 %
NEUTROS ABS: 2891 {cells}/uL (ref 1500–7800)
NEUTROS PCT: 59 %
Platelets: 242 10*3/uL (ref 140–400)
RBC: 4.5 MIL/uL (ref 3.80–5.10)
RDW: 13.2 % (ref 11.0–15.0)
WBC: 4.9 10*3/uL (ref 3.8–10.8)

## 2017-04-07 LAB — LIPID PANEL
CHOLESTEROL: 255 mg/dL — AB (ref <200)
HDL: 72 mg/dL (ref >50)
LDL Cholesterol: 166 mg/dL — ABNORMAL HIGH (ref <100)
TRIGLYCERIDES: 83 mg/dL (ref <150)
Total CHOL/HDL Ratio: 3.5 Ratio (ref <5.0)
VLDL: 17 mg/dL (ref <30)

## 2017-04-07 LAB — HEPATIC FUNCTION PANEL
ALK PHOS: 61 U/L (ref 33–130)
ALT: 13 U/L (ref 6–29)
AST: 14 U/L (ref 10–35)
Albumin: 4.4 g/dL (ref 3.6–5.1)
Bilirubin, Direct: 0.2 mg/dL (ref <=0.2)
Indirect Bilirubin: 1.1 mg/dL (ref 0.2–1.2)
Total Bilirubin: 1.3 mg/dL — ABNORMAL HIGH (ref 0.2–1.2)
Total Protein: 6.9 g/dL (ref 6.1–8.1)

## 2017-04-07 LAB — BASIC METABOLIC PANEL WITH GFR
BUN: 17 mg/dL (ref 7–25)
CHLORIDE: 105 mmol/L (ref 98–110)
CO2: 24 mmol/L (ref 20–32)
Calcium: 9.2 mg/dL (ref 8.6–10.4)
Creat: 0.63 mg/dL (ref 0.50–0.99)
GFR, Est African American: >89 mL/min (ref >=60)
Glucose, Bld: 84 mg/dL (ref 65–99)
POTASSIUM: 4.2 mmol/L (ref 3.5–5.3)
SODIUM: 141 mmol/L (ref 135–146)

## 2017-04-07 LAB — TSH: TSH: 2.49 m[IU]/L

## 2017-04-07 LAB — IRON AND TIBC
%SAT: 52 % — ABNORMAL HIGH (ref 11–50)
IRON: 147 ug/dL (ref 45–160)
TIBC: 281 ug/dL (ref 250–450)
UIBC: 134 ug/dL

## 2017-04-07 NOTE — Telephone Encounter (Signed)
"  I have my appointments too close together.  I am to receive chemotherapy every two weeks then weekly in the end.  I'm scheduled for 04-16-2017 and 04-23-2017."    Scheduled 04-23-2017 for lab, provider F/U ONLY to assess how she tolerated first treatment.  04-16-2017 is the beginning of four biweekly cycles of adjuvant dose dense A/C followed with twelve cycles of weekly Taxol.  Denies further questions at this time.

## 2017-04-07 NOTE — Patient Instructions (Signed)

## 2017-04-08 ENCOUNTER — Encounter: Payer: Self-pay | Admitting: *Deleted

## 2017-04-08 LAB — URINALYSIS, ROUTINE W REFLEX MICROSCOPIC
Bilirubin Urine: NEGATIVE
Glucose, UA: NEGATIVE
KETONES UR: NEGATIVE
NITRITE: NEGATIVE
PH: 5.5 (ref 5.0–8.0)
Protein, ur: NEGATIVE
SPECIFIC GRAVITY, URINE: 1.019 (ref 1.001–1.035)

## 2017-04-08 LAB — URINALYSIS, MICROSCOPIC ONLY
Crystals: NONE SEEN [HPF]
WBC, UA: >60 WBC/HPF — AB (ref <=5)
YEAST: NONE SEEN [HPF]

## 2017-04-08 LAB — MAGNESIUM: Magnesium: 1.9 mg/dL (ref 1.5–2.5)

## 2017-04-08 LAB — HEPATITIS C ANTIBODY: HCV AB: NONREACTIVE

## 2017-04-08 LAB — MICROALBUMIN / CREATININE URINE RATIO
CREATININE, URINE: 116 mg/dL (ref 20–320)
MICROALB UR: 4 mg/dL
MICROALB/CREAT RATIO: 34 ug/mg{creat} — AB (ref <30)

## 2017-04-08 LAB — HIV ANTIBODY (ROUTINE TESTING W REFLEX): HIV 1&2 Ab, 4th Generation: NONREACTIVE

## 2017-04-08 LAB — VITAMIN D 25 HYDROXY (VIT D DEFICIENCY, FRACTURES): Vit D, 25-Hydroxy: 30 ng/mL (ref 30–100)

## 2017-04-08 LAB — VITAMIN B12: VITAMIN B 12: 396 pg/mL (ref 200–1100)

## 2017-04-09 ENCOUNTER — Encounter: Payer: Self-pay | Admitting: *Deleted

## 2017-04-09 ENCOUNTER — Other Ambulatory Visit: Payer: BLUE CROSS/BLUE SHIELD

## 2017-04-09 ENCOUNTER — Ambulatory Visit (HOSPITAL_COMMUNITY)
Admission: RE | Admit: 2017-04-09 | Discharge: 2017-04-09 | Disposition: A | Discharge status: Home / Self Care | Payer: BLUE CROSS/BLUE SHIELD | Source: Ambulatory Visit | Attending: Hematology and Oncology | Admitting: Hematology and Oncology

## 2017-04-09 ENCOUNTER — Telehealth: Payer: Self-pay

## 2017-04-09 DIAGNOSIS — C50411 Malignant neoplasm of upper-outer quadrant of right female breast: Secondary | ICD-10-CM

## 2017-04-09 DIAGNOSIS — Z170 Estrogen receptor positive status [ER+]: Secondary | ICD-10-CM | POA: Insufficient documentation

## 2017-04-09 NOTE — Telephone Encounter (Signed)
Patient stopped by to see if we could move any of her appts earlier in the day,could only move 9/19 and a new avs was printed  Kishan [place]

## 2017-04-09 NOTE — Progress Notes (Signed)
  Echocardiogram 2D Echocardiogram has been performed.  Darlina Sicilian M 04/09/2017, 9:26 AM

## 2017-04-14 ENCOUNTER — Encounter: Payer: Self-pay | Admitting: Hematology and Oncology

## 2017-04-14 NOTE — Progress Notes (Signed)
Called and left voicemail with my contact name and number to discuss chemo options such as applying for Neulasta copay through [place] and PAF if needed. Also to follow up on the [employer] through [facility] as we discussed previously.

## 2017-04-15 ENCOUNTER — Other Ambulatory Visit: Payer: Self-pay | Admitting: Hematology and Oncology

## 2017-04-16 ENCOUNTER — Encounter: Payer: Self-pay | Admitting: *Deleted

## 2017-04-16 ENCOUNTER — Ambulatory Visit (HOSPITAL_BASED_OUTPATIENT_CLINIC_OR_DEPARTMENT_OTHER): Payer: BLUE CROSS/BLUE SHIELD | Admitting: Adult Health

## 2017-04-16 ENCOUNTER — Encounter: Payer: Self-pay | Admitting: Adult Health

## 2017-04-16 ENCOUNTER — Other Ambulatory Visit (HOSPITAL_BASED_OUTPATIENT_CLINIC_OR_DEPARTMENT_OTHER): Payer: BLUE CROSS/BLUE SHIELD

## 2017-04-16 ENCOUNTER — Ambulatory Visit (HOSPITAL_BASED_OUTPATIENT_CLINIC_OR_DEPARTMENT_OTHER): Payer: BLUE CROSS/BLUE SHIELD

## 2017-04-16 ENCOUNTER — Ambulatory Visit: Payer: BLUE CROSS/BLUE SHIELD

## 2017-04-16 DIAGNOSIS — C50411 Malignant neoplasm of upper-outer quadrant of right female breast: Secondary | ICD-10-CM | POA: Diagnosis not present

## 2017-04-16 DIAGNOSIS — Z95828 Presence of other vascular implants and grafts: Secondary | ICD-10-CM

## 2017-04-16 DIAGNOSIS — Z170 Estrogen receptor positive status [ER+]: Secondary | ICD-10-CM

## 2017-04-16 DIAGNOSIS — Z5189 Encounter for other specified aftercare: Secondary | ICD-10-CM

## 2017-04-16 DIAGNOSIS — Z5111 Encounter for antineoplastic chemotherapy: Secondary | ICD-10-CM

## 2017-04-16 LAB — COMPREHENSIVE METABOLIC PANEL
ALT: 12 U/L (ref 0–55)
AST: 14 U/L (ref 5–34)
Albumin: 3.7 g/dL (ref 3.5–5.0)
Alkaline Phosphatase: 58 U/L (ref 40–150)
Anion Gap: 7 mEq/L (ref 3–11)
BUN: 12.8 mg/dL (ref 7.0–26.0)
CHLORIDE: 107 meq/L (ref 98–109)
CO2: 26 meq/L (ref 22–29)
CREATININE: 0.6 mg/dL (ref 0.6–1.1)
Calcium: 9 mg/dL (ref 8.4–10.4)
EGFR: >90 mL/min/{1.73_m2} (ref >90)
GLUCOSE: 80 mg/dL (ref 70–140)
Potassium: 3.8 mEq/L (ref 3.5–5.1)
Sodium: 139 mEq/L (ref 136–145)
Total Bilirubin: 0.75 mg/dL (ref 0.20–1.20)
Total Protein: 6.7 g/dL (ref 6.4–8.3)

## 2017-04-16 LAB — CBC WITH DIFFERENTIAL/PLATELET
BASO%: 1 % (ref 0.0–2.0)
Basophils Absolute: 0.1 10*3/uL (ref 0.0–0.1)
EOS%: 0.5 % (ref 0.0–7.0)
Eosinophils Absolute: 0 10*3/uL (ref 0.0–0.5)
HCT: 37.4 % (ref 34.8–46.6)
HGB: 12.8 g/dL (ref 11.6–15.9)
LYMPH#: 2.1 10*3/uL (ref 0.9–3.3)
LYMPH%: 24.9 % (ref 14.0–49.7)
MCH: 30.7 pg (ref 25.1–34.0)
MCHC: 34.2 g/dL (ref 31.5–36.0)
MCV: 89.7 fL (ref 79.5–101.0)
MONO#: 0.6 10*3/uL (ref 0.1–0.9)
MONO%: 7.4 % (ref 0.0–14.0)
NEUT%: 66.2 % (ref 38.4–76.8)
NEUTROS ABS: 5.7 10*3/uL (ref 1.5–6.5)
Platelets: 172 10*3/uL (ref 145–400)
RBC: 4.17 10*6/uL (ref 3.70–5.45)
RDW: 13.3 % (ref 11.2–14.5)
WBC: 8.6 10*3/uL (ref 3.9–10.3)

## 2017-04-16 MED ORDER — SODIUM CHLORIDE 0.9 % IV SOLN
Freq: Once | INTRAVENOUS | Status: AC
  Administered 2017-04-16: 13:00:00 via INTRAVENOUS

## 2017-04-16 MED ORDER — HEPARIN SOD (PORK) LOCK FLUSH 100 UNIT/ML IV SOLN
500.0000 [IU] | Freq: Once | INTRAVENOUS | Status: AC | PRN
  Administered 2017-04-16: 500 [IU]
  Filled 2017-04-16: qty 5

## 2017-04-16 MED ORDER — PALONOSETRON HCL INJECTION 0.25 MG/5ML
INTRAVENOUS | Status: AC
  Filled 2017-04-16: qty 5

## 2017-04-16 MED ORDER — SODIUM CHLORIDE 0.9% FLUSH
10.0000 mL | INTRAVENOUS | Status: DC | PRN
  Filled 2017-04-16: qty 10

## 2017-04-16 MED ORDER — PALONOSETRON HCL INJECTION 0.25 MG/5ML
0.2500 mg | Freq: Once | INTRAVENOUS | Status: AC
Start: 2017-04-16 — End: 2017-04-16
  Administered 2017-04-16: 0.25 mg via INTRAVENOUS

## 2017-04-16 MED ORDER — DOXORUBICIN HCL CHEMO IV INJECTION 2 MG/ML
60.0000 mg/m2 | Freq: Once | INTRAVENOUS | Status: AC
  Administered 2017-04-16: 112 mg via INTRAVENOUS
  Filled 2017-04-16: qty 56

## 2017-04-16 MED ORDER — SODIUM CHLORIDE 0.9 % IV SOLN
600.0000 mg/m2 | Freq: Once | INTRAVENOUS | Status: AC
  Administered 2017-04-16: 1120 mg via INTRAVENOUS
  Filled 2017-04-16: qty 56

## 2017-04-16 MED ORDER — SODIUM CHLORIDE 0.9% FLUSH
10.0000 mL | INTRAVENOUS | Status: DC | PRN
  Administered 2017-04-16 (×2): 10 mL via INTRAVENOUS
  Filled 2017-04-16: qty 10

## 2017-04-16 MED ORDER — PEGFILGRASTIM 6 MG/0.6ML ~~LOC~~ PSKT
6.0000 mg | PREFILLED_SYRINGE | Freq: Once | SUBCUTANEOUS | Status: AC
  Administered 2017-04-16: 6 mg via SUBCUTANEOUS
  Filled 2017-04-16: qty 0.6

## 2017-04-16 MED ORDER — SODIUM CHLORIDE 0.9 % IV SOLN
Freq: Once | INTRAVENOUS | Status: AC
  Administered 2017-04-16: 13:00:00 via INTRAVENOUS
  Filled 2017-04-16: qty 5

## 2017-04-16 NOTE — Progress Notes (Signed)
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  The STAT Breast cancer panel offered by [facility] includes sequencing and rearrangement analysis for the following 9 genes:  ATM, BRCA1, BRCA2, CDH1, CHEK2, PALB2, PTEN, STK11 and TP53.   The report date is March 09, 2017.  Re-requitisioned to the common hereditary cancer panel and the melanoma panel.  The additional genes tested were negative.  The Hereditary Gene Panel offered by [facility] includes sequencing and/or deletion duplication testing of the following 46 genes: APC, ATM, AXIN2, BARD1, BMPR1A, BRCA1, BRCA2, BRIP1, CDH1, CDKN2A (p14ARF), CDKN2A (p16INK4a), CHEK2, CTNNA1, DICER1, EPCAM (Deletion/duplication testing only), GREM1 (promoter region deletion/duplication testing only), KIT, MEN1, MLH1, MSH2, MSH3, MSH6, MUTYH, NBN, NF1, NHTL1, PALB2, PDGFRA, PMS2, POLD1, POLE, PTEN, RAD50, RAD51C, RAD51D, SDHB,  SDHC, SDHD, SMAD4, SMARCA4. STK11, TP53, TSC1, TSC2, and VHL.  The following genes were evaluated for sequence changes only: SDHA and HOXB13 c.251G>A variant only.  The Melanoma panel offered by [facility] includes sequencing and/or deletion duplication testing of the following 12 genes: BAP1, BRCA1, BRCA2, BRIP1, CDK4, CDKN2A (p14ARF), CDKN2A (p16INK4a), MC1R, POT1, PTEN, RB1, TERT, and TP53.  The following gene was evaluated for sequence changes only: MITF (c.952G>A, p.GLU318Lys variant only). The updated report date is March 14, 2017.        03/19/2017 Surgery    Right lumpectomy: IDC with DCIS, 1.2 cm, grade 2, margins negative, 0/2 lymph nodes negative, ER 100%, PR 10%, HER-2 negative ratio 1.73, Ki-67 40% T1BN0 stage IA      04/01/2017 Oncotype testing    Oncotype Dx score: 41 (28% ROR with Tamoxifen alone)      04/16/2017 -  Chemotherapy    Dose dense Adriamycin and Cytoxan 4 followed by Taxol weekly 12        CURRENT THERAPY: Doxorubicin and Cyclophosphamide cycle 1  INTERVAL HISTORY: Rebecca [name] 61 y.o. female returns for evaluation prior to receiving her first cycle of Doxorubicin and Cyclophosphamide.  She is doing well today.  She wants to know more information about taking her anti nausea medication regimen.  She had her port placed recently and this is still tender.    Patient Active Problem List   Diagnosis Date Noted  . Genetic testing 03/10/2017  . Carcinoma of upper-outer quadrant of right breast in female, estrogen receptor positive ([place]) 03/05/2017  . 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 Family history of breast cancer   .  Family history of melanoma   . Family history of colon cancer   . Family history of brain cancer   . Routine general medical examination at a health care facility 03/20/2016  . Migraine 08/24/2015  . Hyperlipidemia 07/02/2013  . Vitamin D deficiency 07/02/2013    is allergic to citalopram; citalopram; dexamethasone; dexamethasone;  dilaudid [hydromorphone hcl]; propoxycaine; red yeast rice [cholestin]; rofecoxib; and sulfamethoxazole.  MEDICAL HISTORY: Past Medical History:  Diagnosis Date  . Depression    REMISSION/ SITUATIONAL  . Dyspnea   . Family history of brain cancer   . Family history of breast cancer   . Family history of colon cancer   . Family history of melanoma   . Hepatitis AGE 37   RESOLVED  . Hyperlipidemia   . Insomnia   . Migraines    HORMONAL  . Shingles outbreak 2003, 2015   RIGHT HIP, and between breasts 3 years ago    SURGICAL HISTORY: Past Surgical History:  Procedure Laterality Date  . BREAST LUMPECTOMY WITH RADIOACTIVE SEED AND SENTINEL LYMPH NODE BIOPSY Right 03/19/2017   Procedure: RIGHT BREAST LUMPECTOMY WITH RADIOACTIVE SEED AND SENTINEL LYMPH NODE BIOPSY;  Surgeon: Excell Seltzer, MD;  Location: [facility];  Service: General;  Laterality: Right;  . KNEE ARTHROSCOPY Right 2002  . TUBAL LIGATION    . uterine polyps  2016    SOCIAL HISTORY: Social History   Social History  . Marital status: Married    Spouse name: N/A  . Number of children: N/A  . Years of education: N/A   Occupational History  . Not on file.   Social History Main Topics  . Smoking status: Former Smoker    Quit date: 08/26/2008  . Smokeless tobacco: Never Used  . Alcohol use No     Comment: OCCASIONAL  . Drug use: No  . Sexual activity: Yes    Birth control/ protection: None   Other Topics Concern  . Not on file   Social History Narrative   ** Merged History Encounter **        FAMILY HISTORY: Family History  Problem Relation Age of Onset  . Depression Brother   . Lung cancer Brother 15       maternal half brother  . Dementia Mother        Delman Kitten  . Cancer Father 64       MELANOMA CAUSED DEATH/melanoma  . Cancer Brother        multiple myeloma/lung - maternal half brother  . Stroke Maternal Grandmother 80  . Cancer Sister 42       breast- deceased at 11 -  1/2 sister  . Breast cancer Sister   . Heart disease Maternal Grandfather   . Cancer Paternal Grandfather        COLON  . Cervical cancer Maternal Aunt   . Lung cancer Maternal Uncle   . Other Paternal Grandmother        died in childbirth  . Dementia Maternal Aunt     Review of Systems  Constitutional: Negative for appetite change, chills, fatigue and fever.  HENT:   Negative for hearing loss and lump/mass.   Eyes: Negative for eye problems and icterus.  Respiratory: Negative for chest tightness, cough and shortness of breath.   Cardiovascular: Negative for chest pain, leg swelling and palpitations.  Gastrointestinal: Negative for abdominal distention and abdominal pain.  Endocrine: Negative for hot flashes.  Genitourinary: Negative for difficulty urinating.   Musculoskeletal: Negative  for arthralgias.  Skin: Negative for itching and rash.  Neurological: Negative for dizziness, extremity weakness, headaches and numbness.      PHYSICAL EXAMINATION  ECOG PERFORMANCE STATUS: 1 - Symptomatic but completely ambulatory  Vitals:   04/16/17 1121  BP: (!) 152/67  Pulse: 72  Resp: 18  Temp: 97.6 F (36.4 C)  SpO2: 99%    Physical Exam  Constitutional: She is oriented to person, place, and time and well-developed, well-nourished, and in no distress.  HENT:  Head: Normocephalic and atraumatic.  Mouth/Throat: No oropharyngeal exudate.  Eyes: Pupils are equal, round, and reactive to light. No scleral icterus.  Neck: Neck supple.  Cardiovascular: Normal rate, regular rhythm and normal heart sounds.   Pulmonary/Chest: Effort normal and breath sounds normal.  Port in left chest wall, slightly tender   Abdominal: Soft. Bowel sounds are normal. She exhibits no distension. There is no tenderness.  Lymphadenopathy:    She has no cervical adenopathy.  Neurological: She is alert and oriented to person, place, and time.  Skin: Skin is warm and dry. No rash noted.  Psychiatric: Mood  and affect normal.    LABORATORY DATA:  CBC    Component Value Date/Time   WBC 8.6 04/16/2017 0951   WBC 4.9 04/07/2017 0931   RBC 4.17 04/16/2017 0951   RBC 4.50 04/07/2017 0931   HGB 12.8 04/16/2017 0951   HCT 37.4 04/16/2017 0951   PLT 172 04/16/2017 0951   MCV 89.7 04/16/2017 0951   MCH 30.7 04/16/2017 0951   MCH 30.4 04/07/2017 0931   MCHC 34.2 04/16/2017 0951   MCHC 33.7 04/07/2017 0931   RDW 13.3 04/16/2017 0951   LYMPHSABS 2.1 04/16/2017 0951   MONOABS 0.6 04/16/2017 0951   EOSABS 0.0 04/16/2017 0951   BASOSABS 0.1 04/16/2017 0951    CMP     Component Value Date/Time   NA 139 04/16/2017 0951   K 3.8 04/16/2017 0951   CL 105 04/07/2017 0931   CO2 26 04/16/2017 0951   GLUCOSE 80 04/16/2017 0951   BUN 12.8 04/16/2017 0951   CREATININE 0.6 04/16/2017 0951   CALCIUM 9.0 04/16/2017 0951   PROT 6.7 04/16/2017 0951   ALBUMIN 3.7 04/16/2017 0951   AST 14 04/16/2017 0951   ALT 12 04/16/2017 0951   ALKPHOS 58 04/16/2017 0951   BILITOT 0.75 04/16/2017 0951   GFRNONAA >89 04/07/2017 0931   GFRAA >89 04/07/2017 0931       ASSESSMENT and PLAN:   Carcinoma of upper-outer quadrant of right breast in female, estrogen receptor positive ([place]) 03/19/2017: Right lumpectomy: IDC with DCIS, 1.2 cm, grade 2, margins negative, 0/2 lymph nodes negative, ER 100%, PR 10%, HER-2 negative ratio 1.73, Ki-67 40% T1BN0 stage IA CHEK 2 Mutation Positive Oncotype Dx score: 41 (28% ROR with Tamoxifen alone) Treatment plan: 1. Adjuvant chemotherapy with dose dense Adriamycin Cytoxan 4 followed by weekly Taxol 12 2. followed by adjuvant radiation 3. Followed by adjuvant antiestrogen therapy ---------------------------------------------------------------------------- Current treatment: Cycle 1 day 1 dose dense Adriamycin Cytoxan with Neulasta support in the form of Onpro. Antiemetics were reviewed in detail She will start taking Claritin tomorrow to prevent bone pain from  Neulasta Chemotherapy consent obtained Chemotherapy education completed Echocardiogram 04/09/2017: EF 60-65% Closely monitoring for chemotherapy toxicities which we reviewed again today Return to clinic in one week for toxicity check with Dr. Lindi Adie.    All questions were answered. The patient knows to call the clinic with any problems, questions or concerns. We can  certainly see the patient much sooner if necessary.  A total of (30) minutes of face-to-face time was spent with this patient with greater than 50% of that time in counseling and care-coordination.  This note was electronically signed. Scot Dock, NP 04/16/2017

## 2017-04-16 NOTE — Assessment & Plan Note (Addendum)
03/19/2017: Right lumpectomy: IDC with DCIS, 1.2 cm, grade 2, margins negative, 0/2 lymph nodes negative, ER 100%, PR 10%, HER-2 negative ratio 1.73, Ki-67 40% T1BN0 stage IA CHEK 2 Mutation Positive Oncotype Dx score: 41 (28% ROR with Tamoxifen alone) Treatment plan: 1. Adjuvant chemotherapy with dose dense Adriamycin Cytoxan 4 followed by weekly Taxol 12 2. followed by adjuvant radiation 3. Followed by adjuvant antiestrogen therapy ---------------------------------------------------------------------------- Current treatment: Cycle 1 day 1 dose dense Adriamycin Cytoxan with Neulasta support in the form of Onpro. Antiemetics were reviewed in detail She will start taking Claritin tomorrow to prevent bone pain from Neulasta Chemotherapy consent obtained Chemotherapy education completed Echocardiogram 04/09/2017: EF 60-65% Closely monitoring for chemotherapy toxicities which we reviewed again today Return to clinic in one week for toxicity check with Dr. Lindi Adie.

## 2017-04-16 NOTE — Patient Instructions (Signed)

## 2017-04-16 NOTE — Patient Instructions (Signed)
[facility] Discharge Instructions for Patients Receiving Chemotherapy  Today you received the following chemotherapy agents Doxorubicin, Cytoxan  To help prevent nausea and vomiting after your treatment, we encourage you to take your nausea medication as prescribed.   If you develop nausea and vomiting that is not controlled by your nausea medication, call the clinic.   BELOW ARE SYMPTOMS THAT SHOULD BE REPORTED IMMEDIATELY:  *FEVER GREATER THAN 100.5 F  *CHILLS WITH OR WITHOUT FEVER  NAUSEA AND VOMITING THAT IS NOT CONTROLLED WITH YOUR NAUSEA MEDICATION  *UNUSUAL SHORTNESS OF BREATH  *UNUSUAL BRUISING OR BLEEDING  TENDERNESS IN MOUTH AND THROAT WITH OR WITHOUT PRESENCE OF ULCERS  *URINARY PROBLEMS  *BOWEL PROBLEMS  UNUSUAL RASH Items with * indicate a potential emergency and should be followed up as soon as possible.  Feel free to call the clinic you have any questions or concerns. The clinic phone number is [phone].  Please show the [place] at check-in to the Emergency Department and triage nurse.  Doxorubicin injection What is this medicine? DOXORUBICIN (dox oh ROO bi sin) is a chemotherapy drug. It is used to treat many kinds of cancer like leukemia, lymphoma, neuroblastoma, sarcoma, and Wilms' tumor. It is also used to treat bladder cancer, breast cancer, lung cancer, ovarian cancer, stomach cancer, and thyroid cancer. This medicine may be used for other purposes; ask your health care provider or pharmacist if you have questions. COMMON BRAND NAME(S): Adriamycin, Adriamycin PFS, Adriamycin RDF, Rubex What should I tell my health care provider before I take this medicine? They need to know if you have any of these conditions: -heart disease -history of low blood counts caused by a medicine -liver disease -recent or ongoing radiation therapy -an unusual or allergic reaction to doxorubicin, other chemotherapy agents, other medicines,  foods, dyes, or preservatives -pregnant or trying to get pregnant -breast-feeding How should I use this medicine? This drug is given as an infusion into a vein. It is administered in a hospital or clinic by a specially trained health care professional. If you have pain, swelling, burning or any unusual feeling around the site of your injection, tell your health care professional right away. Talk to your pediatrician regarding the use of this medicine in children. Special care may be needed. Overdosage: If you think you have taken too much of this medicine contact a poison control center or emergency room at once. NOTE: This medicine is only for you. Do not share this medicine with others. What if I miss a dose? It is important not to miss your dose. Call your doctor or health care professional if you are unable to keep an appointment. What may interact with this medicine? This medicine may interact with the following medications: -6-mercaptopurine -paclitaxel -phenytoin -St. John's Wort -trastuzumab -verapamil This list may not describe all possible interactions. Give your health care provider a list of all the medicines, herbs, non-prescription drugs, or dietary supplements you use. Also tell them if you smoke, drink alcohol, or use illegal drugs. Some items may interact with your medicine. What should I watch for while using this medicine? This drug may make you feel generally unwell. This is not uncommon, as chemotherapy can affect healthy cells as well as cancer cells. Report any side effects. Continue your course of treatment even though you feel ill unless your doctor tells you to stop. There is a maximum amount of this medicine you should receive throughout your life. The amount depends on the medical  condition being treated and your overall health. Your doctor will watch how much of this medicine you receive in your lifetime. Tell your doctor if you have taken this medicine before. You  may need blood work done while you are taking this medicine. Your urine may turn red for a few days after your dose. This is not blood. If your urine is dark or brown, call your doctor. In some cases, you may be given additional medicines to help with side effects. Follow all directions for their use. Call your doctor or health care professional for advice if you get a fever, chills or sore throat, or other symptoms of a cold or flu. Do not treat yourself. This drug decreases your body's ability to fight infections. Try to avoid being around people who are sick. This medicine may increase your risk to bruise or bleed. Call your doctor or health care professional if you notice any unusual bleeding. Talk to your doctor about your risk of cancer. You may be more at risk for certain types of cancers if you take this medicine. Do not become pregnant while taking this medicine or for 6 months after stopping it. Women should inform their doctor if they wish to become pregnant or think they might be pregnant. Men should not father a child while taking this medicine and for 6 months after stopping it. There is a potential for serious side effects to an unborn child. Talk to your health care professional or pharmacist for more information. Do not breast-feed an infant while taking this medicine. This medicine has caused ovarian failure in some women and reduced sperm counts in some men This medicine may interfere with the ability to have a child. Talk with your doctor or health care professional if you are concerned about your fertility. What side effects may I notice from receiving this medicine? Side effects that you should report to your doctor or health care professional as soon as possible: -allergic reactions like skin rash, itching or hives, swelling of the face, lips, or tongue -breathing problems -chest pain -fast or irregular heartbeat -low blood counts - this medicine may decrease the number of white  blood cells, red blood cells and platelets. You may be at increased risk for infections and bleeding. -pain, redness, or irritation at site where injected -signs of infection - fever or chills, cough, sore throat, pain or difficulty passing urine -signs of decreased platelets or bleeding - bruising, pinpoint red spots on the skin, black, tarry stools, blood in the urine -swelling of the ankles, feet, hands -tiredness -weakness Side effects that usually do not require medical attention (report to your doctor or health care professional if they continue or are bothersome): -diarrhea -hair loss -mouth sores -nail discoloration or damage -nausea -red colored urine -vomiting This list may not describe all possible side effects. Call your doctor for medical advice about side effects. You may report side effects to FDA at 1-800-FDA-1088. Where should I keep my medicine? This drug is given in a hospital or clinic and will not be stored at home. NOTE: This sheet is a summary. It may not cover all possible information. If you have questions about this medicine, talk to your doctor, pharmacist, or health care provider.  2018 Elsevier/Gold Standard (2015-10-09 11:28:51)   Cyclophosphamide injection (Cytoxan) What is this medicine? CYCLOPHOSPHAMIDE (sye kloe FOSS fa mide) is a chemotherapy drug. It slows the growth of cancer cells. This medicine is used to treat many types of cancer like lymphoma,  myeloma, leukemia, breast cancer, and ovarian cancer, to name a few. This medicine may be used for other purposes; ask your health care provider or pharmacist if you have questions. COMMON BRAND NAME(S): Cytoxan, Neosar What should I tell my health care provider before I take this medicine? They need to know if you have any of these conditions: -blood disorders -history of other chemotherapy -infection -kidney disease -liver disease -recent or ongoing radiation therapy -tumors in the bone marrow -an  unusual or allergic reaction to cyclophosphamide, other chemotherapy, other medicines, foods, dyes, or preservatives -pregnant or trying to get pregnant -breast-feeding How should I use this medicine? This drug is usually given as an injection into a vein or muscle or by infusion into a vein. It is administered in a hospital or clinic by a specially trained health care professional. Talk to your pediatrician regarding the use of this medicine in children. Special care may be needed. Overdosage: If you think you have taken too much of this medicine contact a poison control center or emergency room at once. NOTE: This medicine is only for you. Do not share this medicine with others. What if I miss a dose? It is important not to miss your dose. Call your doctor or health care professional if you are unable to keep an appointment. What may interact with this medicine? This medicine may interact with the following medications: -amiodarone -amphotericin B -azathioprine -certain antiviral medicines for HIV or AIDS such as protease inhibitors (e.g., indinavir, ritonavir) and zidovudine -certain blood pressure medications such as benazepril, captopril, enalapril, fosinopril, lisinopril, moexipril, monopril, perindopril, quinapril, ramipril, trandolapril -certain cancer medications such as anthracyclines (e.g., daunorubicin, doxorubicin), busulfan, cytarabine, paclitaxel, pentostatin, tamoxifen, trastuzumab -certain diuretics such as chlorothiazide, chlorthalidone, hydrochlorothiazide, indapamide, metolazone -certain medicines that treat or prevent blood clots like warfarin -certain muscle relaxants such as succinylcholine -cyclosporine -etanercept -indomethacin -medicines to increase blood counts like filgrastim, pegfilgrastim, sargramostim -medicines used as general anesthesia -metronidazole -natalizumab This list may not describe all possible interactions. Give your health care provider a list  of all the medicines, herbs, non-prescription drugs, or dietary supplements you use. Also tell them if you smoke, drink alcohol, or use illegal drugs. Some items may interact with your medicine. What should I watch for while using this medicine? Visit your doctor for checks on your progress. This drug may make you feel generally unwell. This is not uncommon, as chemotherapy can affect healthy cells as well as cancer cells. Report any side effects. Continue your course of treatment even though you feel ill unless your doctor tells you to stop. Drink water or other fluids as directed. Urinate often, even at night. In some cases, you may be given additional medicines to help with side effects. Follow all directions for their use. Call your doctor or health care professional for advice if you get a fever, chills or sore throat, or other symptoms of a cold or flu. Do not treat yourself. This drug decreases your body's ability to fight infections. Try to avoid being around people who are sick. This medicine may increase your risk to bruise or bleed. Call your doctor or health care professional if you notice any unusual bleeding. Be careful brushing and flossing your teeth or using a toothpick because you may get an infection or bleed more easily. If you have any dental work done, tell your dentist you are receiving this medicine. You may get drowsy or dizzy. Do not drive, use machinery, or do anything that needs mental alertness  until you know how this medicine affects you. Do not become pregnant while taking this medicine or for 1 year after stopping it. Women should inform their doctor if they wish to become pregnant or think they might be pregnant. Men should not father a child while taking this medicine and for 4 months after stopping it. There is a potential for serious side effects to an unborn child. Talk to your health care professional or pharmacist for more information. Do not breast-feed an infant  while taking this medicine. This medicine may interfere with the ability to have a child. This medicine has caused ovarian failure in some women. This medicine has caused reduced sperm counts in some men. You should talk with your doctor or health care professional if you are concerned about your fertility. If you are going to have surgery, tell your doctor or health care professional that you have taken this medicine. What side effects may I notice from receiving this medicine? Side effects that you should report to your doctor or health care professional as soon as possible: -allergic reactions like skin rash, itching or hives, swelling of the face, lips, or tongue -low blood counts - this medicine may decrease the number of white blood cells, red blood cells and platelets. You may be at increased risk for infections and bleeding. -signs of infection - fever or chills, cough, sore throat, pain or difficulty passing urine -signs of decreased platelets or bleeding - bruising, pinpoint red spots on the skin, black, tarry stools, blood in the urine -signs of decreased red blood cells - unusually weak or tired, fainting spells, lightheadedness -breathing problems -dark urine -dizziness -palpitations -swelling of the ankles, feet, hands -trouble passing urine or change in the amount of urine -weight gain -yellowing of the eyes or skin Side effects that usually do not require medical attention (report to your doctor or health care professional if they continue or are bothersome): -changes in nail or skin color -hair loss -missed menstrual periods -mouth sores -nausea, vomiting This list may not describe all possible side effects. Call your doctor for medical advice about side effects. You may report side effects to FDA at 1-800-FDA-1088. Where should I keep my medicine? This drug is given in a hospital or clinic and will not be stored at home. NOTE: This sheet is a summary. It may not cover all  possible information. If you have questions about this medicine, talk to your doctor, pharmacist, or health care provider.  2018 Elsevier/Gold Standard (2012-06-26 16:22:58)  Pegfilgrastim injection (Neulasta) What is this medicine? PEGFILGRASTIM (PEG fil gra stim) is a long-acting granulocyte colony-stimulating factor that stimulates the growth of neutrophils, a type of white blood cell important in the body's fight against infection. It is used to reduce the incidence of fever and infection in patients with certain types of cancer who are receiving chemotherapy that affects the bone marrow, and to increase survival after being exposed to high doses of radiation. This medicine may be used for other purposes; ask your health care provider or pharmacist if you have questions. COMMON BRAND NAME(S): Neulasta What should I tell my health care provider before I take this medicine? They need to know if you have any of these conditions: -kidney disease -latex allergy -ongoing radiation therapy -sickle cell disease -skin reactions to acrylic adhesives (On-Body Injector only) -an unusual or allergic reaction to pegfilgrastim, filgrastim, other medicines, foods, dyes, or preservatives -pregnant or trying to get pregnant -breast-feeding How should I use this medicine?  This medicine is for injection under the skin. If you get this medicine at home, you will be taught how to prepare and give the pre-filled syringe or how to use the On-body Injector. Refer to the patient Instructions for Use for detailed instructions. Use exactly as directed. Tell your healthcare provider immediately if you suspect that the On-body Injector may not have performed as intended or if you suspect the use of the On-body Injector resulted in a missed or partial dose. It is important that you put your used needles and syringes in a special sharps container. Do not put them in a trash can. If you do not have a sharps container, call  your pharmacist or healthcare provider to get one. Talk to your pediatrician regarding the use of this medicine in children. While this drug may be prescribed for selected conditions, precautions do apply. Overdosage: If you think you have taken too much of this medicine contact a poison control center or emergency room at once. NOTE: This medicine is only for you. Do not share this medicine with others. What if I miss a dose? It is important not to miss your dose. Call your doctor or health care professional if you miss your dose. If you miss a dose due to an On-body Injector failure or leakage, a new dose should be administered as soon as possible using a single prefilled syringe for manual use. What may interact with this medicine? Interactions have not been studied. Give your health care provider a list of all the medicines, herbs, non-prescription drugs, or dietary supplements you use. Also tell them if you smoke, drink alcohol, or use illegal drugs. Some items may interact with your medicine. This list may not describe all possible interactions. Give your health care provider a list of all the medicines, herbs, non-prescription drugs, or dietary supplements you use. Also tell them if you smoke, drink alcohol, or use illegal drugs. Some items may interact with your medicine. What should I watch for while using this medicine? You may need blood work done while you are taking this medicine. If you are going to need a MRI, CT scan, or other procedure, tell your doctor that you are using this medicine (On-Body Injector only). What side effects may I notice from receiving this medicine? Side effects that you should report to your doctor or health care professional as soon as possible: -allergic reactions like skin rash, itching or hives, swelling of the face, lips, or tongue -dizziness -fever -pain, redness, or irritation at site where injected -pinpoint red spots on the skin -red or dark-brown  urine -shortness of breath or breathing problems -stomach or side pain, or pain at the shoulder -swelling -tiredness -trouble passing urine or change in the amount of urine Side effects that usually do not require medical attention (report to your doctor or health care professional if they continue or are bothersome): -bone pain -muscle pain This list may not describe all possible side effects. Call your doctor for medical advice about side effects. You may report side effects to FDA at 1-800-FDA-1088. Where should I keep my medicine? Keep out of the reach of children. Store pre-filled syringes in a refrigerator between 2 and 8 degrees C (36 and 46 degrees F). Do not freeze. Keep in carton to protect from light. Throw away this medicine if it is left out of the refrigerator for more than 48 hours. Throw away any unused medicine after the expiration date. NOTE: This sheet is a summary. It  may not cover all possible information. If you have questions about this medicine, talk to your doctor, pharmacist, or health care provider.  2018 Elsevier/Gold Standard (2016-08-08 12:58:03)

## 2017-04-16 NOTE — Progress Notes (Signed)
@  1005 04/16/2017 [facility] [phone], where pt had PAC placed. Medical records is faxing over a report to verify tip placement, ASAP.   Report faxed/ tip placement verified, mid SVC.

## 2017-04-17 ENCOUNTER — Encounter: Payer: Self-pay | Admitting: *Deleted

## 2017-04-17 ENCOUNTER — Telehealth: Payer: Self-pay

## 2017-04-17 NOTE — Telephone Encounter (Signed)
Called pt to follow up on her first time chemo treatment. Pt states that she is feeling okay except she is having a headache. She did take tylenol and had gone back to bed to sleep. Pt denies n/v/d, fevers, chills at this time. Reinforced the importance of oral hydration and hand hygiene. Also encouraged pt to call with non resolved symptoms. Pt confirms taking her decadron, compazine, and ativan. Pt also taking claritin to help with bone pain from neulasta onpro symptoms. Pt to take onpro off at 7pm. No further concerns needed at this time.

## 2017-04-17 NOTE — Progress Notes (Signed)
Rebecca [name]  Clinical Social [name] was referred by need for follow up due to financial concerns.  Pt was to bring additional financial paperwork in order to complete applications. CSW missed pt at chemo and phoned her to follow up as pt did not drop off documents.  Clinical Social Worker completed needed applications and contacted pt via phone to remind her to bring needed documents to next appointment and CSW will complete accordingly.  Pt aware she can bring applications and leave with Rebecca [name] as needed. CSW to follow and assist accordingly.    Clinical Social [name] interventions:  Resource education and referral  Rebecca Racer, LCSW, OSW-C Clinical Social Worker [place]  [place] Phone: [phone] Fax: [phone]

## 2017-04-18 ENCOUNTER — Telehealth: Payer: Self-pay

## 2017-04-18 NOTE — Telephone Encounter (Signed)
Pt had 1st AC on Wednesday. She has sore throat, started this AM. Has not looked at throat. No white patches, normal redness.   S/w Mendel Ryder NP. Salt water gargles, tylenol, drink plenty of liquids. If gets worse or gets fevers/chills over weekend-go to urgent care.  Discussed thrush and mucositis.

## 2017-04-23 ENCOUNTER — Ambulatory Visit (HOSPITAL_BASED_OUTPATIENT_CLINIC_OR_DEPARTMENT_OTHER): Payer: BLUE CROSS/BLUE SHIELD | Admitting: Hematology and Oncology

## 2017-04-23 ENCOUNTER — Ambulatory Visit (HOSPITAL_BASED_OUTPATIENT_CLINIC_OR_DEPARTMENT_OTHER): Payer: BLUE CROSS/BLUE SHIELD

## 2017-04-23 ENCOUNTER — Other Ambulatory Visit (HOSPITAL_BASED_OUTPATIENT_CLINIC_OR_DEPARTMENT_OTHER): Payer: BLUE CROSS/BLUE SHIELD

## 2017-04-23 ENCOUNTER — Encounter: Payer: Self-pay | Admitting: Hematology and Oncology

## 2017-04-23 ENCOUNTER — Encounter: Payer: Self-pay | Admitting: *Deleted

## 2017-04-23 DIAGNOSIS — Z170 Estrogen receptor positive status [ER+]: Secondary | ICD-10-CM

## 2017-04-23 DIAGNOSIS — Z95828 Presence of other vascular implants and grafts: Secondary | ICD-10-CM

## 2017-04-23 DIAGNOSIS — Z452 Encounter for adjustment and management of vascular access device: Secondary | ICD-10-CM

## 2017-04-23 DIAGNOSIS — C50411 Malignant neoplasm of upper-outer quadrant of right female breast: Secondary | ICD-10-CM

## 2017-04-23 LAB — CBC WITH DIFFERENTIAL/PLATELET
BASO%: 1.5 % (ref 0.0–2.0)
Basophils Absolute: 0 10*3/uL (ref 0.0–0.1)
EOS%: 2 % (ref 0.0–7.0)
Eosinophils Absolute: 0.1 10*3/uL (ref 0.0–0.5)
HCT: 38 % (ref 34.8–46.6)
HGB: 12.9 g/dL (ref 11.6–15.9)
LYMPH%: 35.2 % (ref 14.0–49.7)
MCH: 30.2 pg (ref 25.1–34.0)
MCHC: 33.8 g/dL (ref 31.5–36.0)
MCV: 89.4 fL (ref 79.5–101.0)
MONO#: 0.3 10*3/uL (ref 0.1–0.9)
MONO%: 10.5 % (ref 0.0–14.0)
NEUT%: 50.8 % (ref 38.4–76.8)
NEUTROS ABS: 1.3 10*3/uL — AB (ref 1.5–6.5)
Platelets: 85 10*3/uL — ABNORMAL LOW (ref 145–400)
RBC: 4.25 10*6/uL (ref 3.70–5.45)
RDW: 13 % (ref 11.2–14.5)
WBC: 2.5 10*3/uL — AB (ref 3.9–10.3)
lymph#: 0.9 10*3/uL (ref 0.9–3.3)

## 2017-04-23 LAB — COMPREHENSIVE METABOLIC PANEL
ALT: 17 U/L (ref 0–55)
ANION GAP: 7 meq/L (ref 3–11)
AST: 13 U/L (ref 5–34)
Albumin: 3.8 g/dL (ref 3.5–5.0)
Alkaline Phosphatase: 76 U/L (ref 40–150)
BILIRUBIN TOTAL: 0.72 mg/dL (ref 0.20–1.20)
BUN: 12.7 mg/dL (ref 7.0–26.0)
CHLORIDE: 103 meq/L (ref 98–109)
CO2: 26 meq/L (ref 22–29)
Calcium: 9.4 mg/dL (ref 8.4–10.4)
Creatinine: 0.7 mg/dL (ref 0.6–1.1)
GLUCOSE: 98 mg/dL (ref 70–140)
POTASSIUM: 4.1 meq/L (ref 3.5–5.1)
SODIUM: 136 meq/L (ref 136–145)
TOTAL PROTEIN: 7 g/dL (ref 6.4–8.3)

## 2017-04-23 MED ORDER — HEPARIN SOD (PORK) LOCK FLUSH 100 UNIT/ML IV SOLN
500.0000 [IU] | Freq: Once | INTRAVENOUS | Status: AC
  Administered 2017-04-23: 500 [IU]
  Filled 2017-04-23: qty 5

## 2017-04-23 MED ORDER — SODIUM CHLORIDE 0.9% FLUSH
10.0000 mL | Freq: Once | INTRAVENOUS | Status: AC
  Administered 2017-04-23: 10 mL
  Filled 2017-04-23: qty 10

## 2017-04-23 NOTE — Assessment & Plan Note (Signed)
03/19/2017: Right lumpectomy: IDC with DCIS, 1.2 cm, grade 2, margins negative, 0/2 lymph nodes negative, ER 100%, PR 10%, HER-2 negative ratio 1.73, Ki-67 40% T1BN0 stage IA CHEK 2 Mutation Positive Oncotype Dx score: 41 (28% ROR with Tamoxifen alone) Treatment plan: 1. Adjuvant chemotherapy with dose dense Adriamycin Cytoxan 4 followed by weekly Taxol 12 2. followed by adjuvant radiation 3. Followed by adjuvant antiestrogen therapy ---------------------------------------------------------------------------- Current treatment: Cycle 1 day 8 dose dense Adriamycin Cytoxan with Neulasta support in the form of Onpro.  Chemotherapy toxicities:  Labs reviewed Return to clinic in one week for cycle 2

## 2017-04-23 NOTE — Progress Notes (Signed)
Patient Care Team: [name], MD as PCP - General (Internal Medicine) [name], MD (Internal Medicine) Arta Silence, MD as Consulting Physician (Gastroenterology) Arvella Nigh, MD as Consulting Physician (Obstetrics and Gynecology)  DIAGNOSIS:  Encounter Diagnosis  Name Primary?  . 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  The STAT Breast cancer panel offered by [facility] includes sequencing and rearrangement analysis for the following 9 genes:  ATM, BRCA1, BRCA2, CDH1, CHEK2, PALB2, PTEN, STK11 and TP53.   The report date is March 09, 2017.  Re-requitisioned to the common hereditary cancer panel and the melanoma panel.  The additional genes tested were negative.  The Hereditary Gene Panel offered by [facility] includes sequencing and/or deletion duplication testing of the following 46 genes: APC, ATM, AXIN2, BARD1, BMPR1A, BRCA1, BRCA2, BRIP1, CDH1, CDKN2A (p14ARF), CDKN2A (p16INK4a), CHEK2, CTNNA1, DICER1, EPCAM (Deletion/duplication testing only), GREM1 (promoter region deletion/duplication testing only), KIT, MEN1, MLH1, MSH2, MSH3, MSH6, MUTYH, NBN, NF1, NHTL1, PALB2, PDGFRA, PMS2, POLD1, POLE, PTEN, RAD50, RAD51C, RAD51D, SDHB, SDHC, SDHD, SMAD4, SMARCA4. STK11, TP53, TSC1, TSC2, and VHL.  The following  genes were evaluated for sequence changes only: SDHA and HOXB13 c.251G>A variant only.  The Melanoma panel offered by [facility] includes sequencing and/or deletion duplication testing of the following 12 genes: BAP1, BRCA1, BRCA2, BRIP1, CDK4, CDKN2A (p14ARF), CDKN2A (p16INK4a), MC1R, POT1, PTEN, RB1, TERT, and TP53.  The following gene was evaluated for sequence changes only: MITF (c.952G>A, p.GLU318Lys variant only). The updated report date is March 14, 2017.        03/19/2017 Surgery    Right lumpectomy: IDC with DCIS, 1.2 cm, grade 2, margins negative, 0/2 lymph nodes negative, ER 100%, PR 10%, HER-2 negative ratio 1.73, Ki-67 40% T1BN0 stage IA      04/01/2017 Oncotype testing    Oncotype Dx score: 41 (28% ROR with Tamoxifen alone)      04/16/2017 -  Chemotherapy    Dose dense Adriamycin and Cytoxan 4 followed by Taxol weekly 12        CHIEF COMPLIANT: Cycle 1 day 8 of dose dense Adriamycin Cytoxan  INTERVAL HISTORY: Rebecca [name] is a 61 year old with above-mentioned history of right breast cancer treated with lumpectomy and is currently on adjuvant chemotherapy and today's cycle 1 day 8 of dose dense Adriamycin and Cytoxan. Overall she tolerated chemotherapy fairly well. She had headache the day after chemotherapy. Did not have any nausea vomiting. She does have some sore throat. Denies any fevers or chills. Had constipation for a couple of days. Fatigue from chemotherapy.  REVIEW OF SYSTEMS:   Constitutional: Denies fevers, chills or abnormal weight loss Eyes: Denies blurriness of vision Ears, nose, mouth, throat, and face: Denies mucositis or sore throat Respiratory: Denies cough, dyspnea or wheezes Cardiovascular: Denies palpitation, chest  discomfort Gastrointestinal:  Denies nausea, heartburn or change in bowel habits Skin: Denies abnormal skin rashes Lymphatics: Denies new lymphadenopathy or easy bruising Neurological:Denies numbness, tingling or new  weaknesses Behavioral/Psych: Mood is stable, no new changes  Extremities: No lower extremity edema  All other systems were reviewed with the patient and are negative.  I have reviewed the past medical history, past surgical history, social history and family history with the patient and they are unchanged from previous note.  ALLERGIES:  is allergic to citalopram; citalopram; dexamethasone; dexamethasone; dilaudid [hydromorphone hcl]; propoxycaine; red yeast rice [cholestin]; rofecoxib; and sulfamethoxazole.  MEDICATIONS:  Current Outpatient Prescriptions  Medication Sig Dispense Refill  . dexamethasone (DECADRON) 4 MG tablet Take 1 tablet (4 mg total) by mouth daily. Take 1 tablet with food daily for 3 days starting day after chemotherapy 12 tablet 0  . lidocaine-prilocaine (EMLA) cream Apply to affected area once 30 g 3  . LORazepam (ATIVAN) 0.5 MG tablet Take 1 tablet (0.5 mg total) by mouth at bedtime. As needed 30 tablet 0  . ondansetron (ZOFRAN) 8 MG tablet Take 1 tablet (8 mg total) by mouth 2 (two) times daily as needed. Start on the third day after chemotherapy. 30 tablet 1  . prochlorperazine (COMPAZINE) 10 MG tablet Take 1 tablet (10 mg total) by mouth every 6 (six) hours as needed (Nausea or vomiting). 30 tablet 1   No current facility-administered medications for this visit.     PHYSICAL EXAMINATION: ECOG PERFORMANCE STATUS: 1 - Symptomatic but completely ambulatory  Vitals:   04/23/17 1120  BP: 134/80  Pulse: 72  Resp: 17  Temp: 98.4 F (36.9 C)  SpO2: 99%   Filed Weights   04/23/17 1120  Weight: 162 lb 9.6 oz (73.8 kg)    GENERAL:alert, no distress and comfortable SKIN: skin color, texture, turgor are normal, no rashes or significant lesions EYES: normal, Conjunctiva are pink and non-injected, sclera clear OROPHARYNX:no exudate, no erythema and lips, buccal mucosa, and tongue normal  NECK: supple, thyroid normal size, non-tender, without nodularity LYMPH:   no palpable lymphadenopathy in the cervical, axillary or inguinal LUNGS: clear to auscultation and percussion with normal breathing effort HEART: regular rate & rhythm and no murmurs and no lower extremity edema ABDOMEN:abdomen soft, non-tender and normal bowel sounds MUSCULOSKELETAL:no cyanosis of digits and no clubbing  NEURO: alert & oriented x 3 with fluent speech, no focal motor/sensory deficits EXTREMITIES: No lower extremity edema  LABORATORY DATA:  I have reviewed the data as listed   Chemistry      Component Value Date/Time   NA 136 04/23/2017 1037   K 4.1 04/23/2017 1037   CL 105 04/07/2017 0931   CO2 26 04/23/2017 1037   BUN 12.7 04/23/2017 1037   CREATININE 0.7 04/23/2017 1037      Component Value Date/Time   CALCIUM 9.4 04/23/2017 1037   ALKPHOS 76 04/23/2017 1037   AST 13 04/23/2017 1037   ALT 17 04/23/2017 1037   BILITOT 0.72 04/23/2017 1037       Lab Results  Component Value Date   WBC 2.5 (L) 04/23/2017   HGB 12.9 04/23/2017   HCT 38.0 04/23/2017   MCV 89.4 04/23/2017   PLT 85 (L) 04/23/2017   NEUTROABS 1.3 (L) 04/23/2017    ASSESSMENT & PLAN:  Carcinoma of upper-outer quadrant of right breast in female, estrogen receptor positive ([place]) 03/19/2017: Right lumpectomy: IDC with DCIS, 1.2 cm, grade 2, margins negative, 0/2 lymph nodes negative, ER 100%, PR 10%,  HER-2 negative ratio 1.73, Ki-67 40% T1BN0 stage IA CHEK 2 Mutation Positive Oncotype Dx score: 41 (28% ROR with Tamoxifen alone) Treatment plan: 1. Adjuvant chemotherapy with dose dense Adriamycin Cytoxan 4 followed by weekly Taxol 12 2. followed by adjuvant radiation 3. Followed by adjuvant antiestrogen therapy ---------------------------------------------------------------------------- Current treatment: Cycle 1 day 8 dose dense Adriamycin Cytoxan with Neulasta support in the form of Onpro.  Chemotherapy toxicities: 1. Fatigue 2. Constipation I reviewed her blood work and she has a mild  leukopenia which is expected. There is no need to change her chemotherapy dosing. Monitoring closely for toxicities Return to clinic in one week for cycle 2   I spent 25 minutes talking to the patient of which more than half was spent in counseling and coordination of care.  No orders of the defined types were placed in this encounter.  The patient has a good understanding of the overall plan. she agrees with it. she will call with any problems that may develop before the next visit here.   Rulon Eisenmenger, MD 04/23/17

## 2017-04-23 NOTE — Progress Notes (Signed)
[facility] Clinical Social Work  Clinical Social Work received all documents from pt for [facility] in [place]. CSW made copies and submitted applications via [place] mail for pt. CSW team to follow accordingly.   Clinical Social Work interventions:  Resource assistance  Loren Racer, [employer], OSW-C Clinical Social Worker [place]  [place] Phone: [phone] Fax: [phone]

## 2017-04-24 ENCOUNTER — Telehealth: Payer: Self-pay

## 2017-04-24 NOTE — Telephone Encounter (Signed)
Pt wanted to inform Dr Lindi Adie had bad pain in her bone in her lower back. Claritin did take pain away. Instructed her to continue using claritin.   Pt does have a headache that has been pretty constant since chemo on 8/22. Tylenol takes it away for a little while. Is there anything else she can take besides tylenol and claritin?

## 2017-04-24 NOTE — Telephone Encounter (Signed)
Will follow up with patient

## 2017-04-25 ENCOUNTER — Telehealth: Payer: Self-pay

## 2017-04-25 NOTE — Telephone Encounter (Signed)
Called pt this morning to follow up on her symptoms with her headaches. Pt states that she feels so much better today. Pt took tylenol and is headache now resolving. Pt taking claritin to help with the bone pain from her neulasta on pro. Pt encouraged to drink plenty of fluids and to monitor for fevers, chills, n/v and mucositis. Reminded pt that the cancer center is closed on Monday for the holiday and may still call our after hours line. Pt verbalized understanding and will keep it in mind. No further questions or concerns with this phone encounter.

## 2017-04-30 ENCOUNTER — Other Ambulatory Visit (HOSPITAL_BASED_OUTPATIENT_CLINIC_OR_DEPARTMENT_OTHER): Payer: BLUE CROSS/BLUE SHIELD

## 2017-04-30 ENCOUNTER — Ambulatory Visit: Payer: BLUE CROSS/BLUE SHIELD

## 2017-04-30 ENCOUNTER — Ambulatory Visit (HOSPITAL_BASED_OUTPATIENT_CLINIC_OR_DEPARTMENT_OTHER): Payer: BLUE CROSS/BLUE SHIELD | Admitting: Hematology and Oncology

## 2017-04-30 ENCOUNTER — Ambulatory Visit (HOSPITAL_BASED_OUTPATIENT_CLINIC_OR_DEPARTMENT_OTHER): Payer: BLUE CROSS/BLUE SHIELD

## 2017-04-30 ENCOUNTER — Encounter: Payer: Self-pay | Admitting: Hematology and Oncology

## 2017-04-30 VITALS — BP 146/72 | HR 74 | Temp 98.1°F | Resp 18 | Ht 66.0 in | Wt 165.2 lb

## 2017-04-30 DIAGNOSIS — Z1589 Genetic susceptibility to other disease: Principal | ICD-10-CM

## 2017-04-30 DIAGNOSIS — Z5189 Encounter for other specified aftercare: Secondary | ICD-10-CM

## 2017-04-30 DIAGNOSIS — Z1502 Genetic susceptibility to malignant neoplasm of ovary: Principal | ICD-10-CM

## 2017-04-30 DIAGNOSIS — C50411 Malignant neoplasm of upper-outer quadrant of right female breast: Secondary | ICD-10-CM

## 2017-04-30 DIAGNOSIS — Z170 Estrogen receptor positive status [ER+]: Secondary | ICD-10-CM | POA: Diagnosis not present

## 2017-04-30 DIAGNOSIS — Z95828 Presence of other vascular implants and grafts: Secondary | ICD-10-CM

## 2017-04-30 DIAGNOSIS — Z5111 Encounter for antineoplastic chemotherapy: Secondary | ICD-10-CM | POA: Diagnosis not present

## 2017-04-30 DIAGNOSIS — C50919 Malignant neoplasm of unspecified site of unspecified female breast: Secondary | ICD-10-CM

## 2017-04-30 DIAGNOSIS — Z1509 Genetic susceptibility to other malignant neoplasm: Principal | ICD-10-CM

## 2017-04-30 LAB — CBC WITH DIFFERENTIAL/PLATELET
BASO%: 0.8 % (ref 0.0–2.0)
BASOS ABS: 0.1 10*3/uL (ref 0.0–0.1)
EOS ABS: 0 10*3/uL (ref 0.0–0.5)
EOS%: 0.2 % (ref 0.0–7.0)
HEMATOCRIT: 36.8 % (ref 34.8–46.6)
HEMOGLOBIN: 12.2 g/dL (ref 11.6–15.9)
LYMPH%: 15.2 % (ref 14.0–49.7)
MCH: 30.5 pg (ref 25.1–34.0)
MCHC: 33.2 g/dL (ref 31.5–36.0)
MCV: 92 fL (ref 79.5–101.0)
MONO#: 0.8 10*3/uL (ref 0.1–0.9)
MONO%: 6.6 % (ref 0.0–14.0)
NEUT#: 9.5 10*3/uL — ABNORMAL HIGH (ref 1.5–6.5)
NEUT%: 77.2 % — ABNORMAL HIGH (ref 38.4–76.8)
Platelets: 159 10*3/uL (ref 145–400)
RBC: 4 10*6/uL (ref 3.70–5.45)
RDW: 13 % (ref 11.2–14.5)
WBC: 12.3 10*3/uL — ABNORMAL HIGH (ref 3.9–10.3)
lymph#: 1.9 10*3/uL (ref 0.9–3.3)

## 2017-04-30 LAB — COMPREHENSIVE METABOLIC PANEL
ALBUMIN: 3.9 g/dL (ref 3.5–5.0)
ALK PHOS: 87 U/L (ref 40–150)
ALT: 17 U/L (ref 0–55)
ANION GAP: 8 meq/L (ref 3–11)
AST: 17 U/L (ref 5–34)
BUN: 14.2 mg/dL (ref 7.0–26.0)
CALCIUM: 9.1 mg/dL (ref 8.4–10.4)
CO2: 25 mEq/L (ref 22–29)
Chloride: 105 mEq/L (ref 98–109)
Creatinine: 0.7 mg/dL (ref 0.6–1.1)
Glucose: 97 mg/dl (ref 70–140)
POTASSIUM: 4.1 meq/L (ref 3.5–5.1)
Sodium: 138 mEq/L (ref 136–145)
Total Bilirubin: 0.41 mg/dL (ref 0.20–1.20)
Total Protein: 7 g/dL (ref 6.4–8.3)

## 2017-04-30 MED ORDER — SODIUM CHLORIDE 0.9 % IV SOLN
600.0000 mg/m2 | Freq: Once | INTRAVENOUS | Status: AC
  Administered 2017-04-30: 1120 mg via INTRAVENOUS
  Filled 2017-04-30: qty 56

## 2017-04-30 MED ORDER — DOXORUBICIN HCL CHEMO IV INJECTION 2 MG/ML
60.0000 mg/m2 | Freq: Once | INTRAVENOUS | Status: AC
  Administered 2017-04-30: 112 mg via INTRAVENOUS
  Filled 2017-04-30: qty 56

## 2017-04-30 MED ORDER — SODIUM CHLORIDE 0.9% FLUSH
10.0000 mL | Freq: Once | INTRAVENOUS | Status: AC
  Administered 2017-04-30: 10 mL
  Filled 2017-04-30: qty 10

## 2017-04-30 MED ORDER — HEPARIN SOD (PORK) LOCK FLUSH 100 UNIT/ML IV SOLN
500.0000 [IU] | Freq: Once | INTRAVENOUS | Status: AC | PRN
  Administered 2017-04-30: 500 [IU]
  Filled 2017-04-30: qty 5

## 2017-04-30 MED ORDER — SODIUM CHLORIDE 0.9 % IV SOLN
Freq: Once | INTRAVENOUS | Status: AC
  Administered 2017-04-30: 13:00:00 via INTRAVENOUS

## 2017-04-30 MED ORDER — SODIUM CHLORIDE 0.9 % IV SOLN
Freq: Once | INTRAVENOUS | Status: AC
  Administered 2017-04-30: 13:00:00 via INTRAVENOUS
  Filled 2017-04-30: qty 5

## 2017-04-30 MED ORDER — SODIUM CHLORIDE 0.9% FLUSH
10.0000 mL | INTRAVENOUS | Status: DC | PRN
  Administered 2017-04-30: 10 mL
  Filled 2017-04-30: qty 10

## 2017-04-30 MED ORDER — PALONOSETRON HCL INJECTION 0.25 MG/5ML
0.2500 mg | Freq: Once | INTRAVENOUS | Status: AC
Start: 2017-04-30 — End: 2017-04-30
  Administered 2017-04-30: 0.25 mg via INTRAVENOUS

## 2017-04-30 MED ORDER — PALONOSETRON HCL INJECTION 0.25 MG/5ML
INTRAVENOUS | Status: AC
  Filled 2017-04-30: qty 5

## 2017-04-30 MED ORDER — PEGFILGRASTIM 6 MG/0.6ML ~~LOC~~ PSKT
6.0000 mg | PREFILLED_SYRINGE | Freq: Once | SUBCUTANEOUS | Status: AC
  Administered 2017-04-30: 6 mg via SUBCUTANEOUS
  Filled 2017-04-30: qty 0.6

## 2017-04-30 NOTE — Patient Instructions (Signed)
[facility] Discharge Instructions for Patients Receiving Chemotherapy  Today you received the following chemotherapy agents Adriamycin/Cytoxan.  To help prevent nausea and vomiting after your treatment, we encourage you to take your nausea medication as prescribed.    If you develop nausea and vomiting that is not controlled by your nausea medication, call the clinic.   BELOW ARE SYMPTOMS THAT SHOULD BE REPORTED IMMEDIATELY:  *FEVER GREATER THAN 100.5 F  *CHILLS WITH OR WITHOUT FEVER  NAUSEA AND VOMITING THAT IS NOT CONTROLLED WITH YOUR NAUSEA MEDICATION  *UNUSUAL SHORTNESS OF BREATH  *UNUSUAL BRUISING OR BLEEDING  TENDERNESS IN MOUTH AND THROAT WITH OR WITHOUT PRESENCE OF ULCERS  *URINARY PROBLEMS  *BOWEL PROBLEMS  UNUSUAL RASH Items with * indicate a potential emergency and should be followed up as soon as possible.  Feel free to call the clinic you have any questions or concerns. The clinic phone number is [phone].  Please show the CHEMO ALERT CARD at check-in to the Emergency Department and triage nurse.   

## 2017-04-30 NOTE — Assessment & Plan Note (Signed)
03/19/2017: Right lumpectomy: IDC with DCIS, 1.2 cm, grade 2, margins negative, 0/2 lymph nodes negative, ER 100%, PR 10%, HER-2 negative ratio 1.73, Ki-67 40% T1BN0 stage IA CHEK 2 Mutation Positive Oncotype Dx score: 41 (28% ROR with Tamoxifen alone) Treatment plan: 1. Adjuvant chemotherapy with dose dense Adriamycin Cytoxan 4 followed by weekly Taxol 12 2. followed by adjuvant radiation 3. Followed by adjuvant antiestrogen therapy ---------------------------------------------------------------------------- Current treatment: Cycle 2 day 1 dose dense Adriamycin Cytoxan with Neulasta support in the form of Onpro.  Chemotherapy toxicities: 1. Fatigue 2. Constipation I reviewed her blood work and she has a mild leukopenia which is expected. There is no need to change her chemotherapy dosing. Monitoring closely for toxicities  Return to clinic in 2 weeks for cycle 3

## 2017-04-30 NOTE — Progress Notes (Signed)
Patient Care Team: [name], MD as PCP - General (Internal Medicine) [name], MD (Internal Medicine) Arta Silence, MD as Consulting Physician (Gastroenterology) Arvella Nigh, MD as Consulting Physician (Obstetrics and Gynecology)  DIAGNOSIS:  Encounter Diagnoses  Name Primary?  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  The STAT Breast cancer panel offered by [facility] includes sequencing and rearrangement analysis for the following 9 genes:  ATM, BRCA1, BRCA2, CDH1, CHEK2, PALB2, PTEN, STK11 and TP53.   The report date is March 09, 2017.  Re-requitisioned to the common hereditary cancer panel and the melanoma panel.  The additional genes tested were negative.  The Hereditary Gene Panel offered by [facility] includes sequencing and/or deletion duplication testing of the following 46 genes: APC, ATM, AXIN2, BARD1, BMPR1A, BRCA1, BRCA2, BRIP1, CDH1, CDKN2A (p14ARF), CDKN2A (p16INK4a), CHEK2, CTNNA1, DICER1, EPCAM (Deletion/duplication testing only), GREM1 (promoter region deletion/duplication testing only), KIT, MEN1, MLH1, MSH2, MSH3, MSH6, MUTYH, NBN, NF1, NHTL1, PALB2, PDGFRA, PMS2, POLD1, POLE, PTEN, RAD50, RAD51C, RAD51D, SDHB, SDHC, SDHD, SMAD4, SMARCA4. STK11,  TP53, TSC1, TSC2, and VHL.  The following genes were evaluated for sequence changes only: SDHA and HOXB13 c.251G>A variant only.  The Melanoma panel offered by [facility] includes sequencing and/or deletion duplication testing of the following 12 genes: BAP1, BRCA1, BRCA2, BRIP1, CDK4, CDKN2A (p14ARF), CDKN2A (p16INK4a), MC1R, POT1, PTEN, RB1, TERT, and TP53.  The following gene was evaluated for sequence changes only: MITF (c.952G>A, p.GLU318Lys variant only). The updated report date is March 14, 2017.        03/19/2017 Surgery    Right lumpectomy: IDC with DCIS, 1.2 cm, grade 2, margins negative, 0/2 lymph nodes negative, ER 100%, PR 10%, HER-2 negative ratio 1.73, Ki-67 40% T1BN0 stage IA      04/01/2017 Oncotype testing    Oncotype Dx score: 41 (28% ROR with Tamoxifen alone)      04/16/2017 -  Chemotherapy    Dose dense Adriamycin and Cytoxan 4 followed by Taxol weekly 12        CHIEF COMPLIANT: Cycle 2 dose dense Adriamycin and Cytoxan  INTERVAL HISTORY: Rebecca [name] is a 61 year old with above-mentioned history of right breast cancer treated with lumpectomy and is currently on adjuvant chemotherapy. Today is cycle 2 of dose dense Adriamycin and Cytoxan. Overall she tolerated chemotherapy extremely well except for some fatigue and constipation. She denied any nausea vomiting.  REVIEW OF SYSTEMS:   Constitutional: Denies fevers, chills or abnormal weight loss Eyes: Denies blurriness of vision Ears, nose, mouth, throat, and face: Denies mucositis or sore throat Respiratory: Denies cough, dyspnea or wheezes Cardiovascular: Denies palpitation, chest discomfort Gastrointestinal:  Denies nausea, heartburn or change in bowel habits Skin: Denies abnormal skin rashes Lymphatics: Denies new lymphadenopathy  or easy bruising Neurological:Denies numbness, tingling or new weaknesses Behavioral/Psych: Mood is stable, no new changes  Extremities: No lower extremity edema Breast:  denies any pain  or lumps or nodules in either breasts All other systems were reviewed with the patient and are negative.  I have reviewed the past medical history, past surgical history, social history and family history with the patient and they are unchanged from previous note.  ALLERGIES:  is allergic to citalopram; citalopram; dexamethasone; dexamethasone; dilaudid [hydromorphone hcl]; propoxycaine; red yeast rice [cholestin]; rofecoxib; and sulfamethoxazole.  MEDICATIONS:  Current Outpatient Prescriptions  Medication Sig Dispense Refill  . dexamethasone (DECADRON) 4 MG tablet Take 1 tablet (4 mg total) by mouth daily. Take 1 tablet with food daily for 3 days starting day after chemotherapy 12 tablet 0  . lidocaine-prilocaine (EMLA) cream Apply to affected area once 30 g 3  . LORazepam (ATIVAN) 0.5 MG tablet Take 1 tablet (0.5 mg total) by mouth at bedtime. As needed 30 tablet 0  . ondansetron (ZOFRAN) 8 MG tablet Take 1 tablet (8 mg total) by mouth 2 (two) times daily as needed. Start on the third day after chemotherapy. 30 tablet 1  . prochlorperazine (COMPAZINE) 10 MG tablet Take 1 tablet (10 mg total) by mouth every 6 (six) hours as needed (Nausea or vomiting). 30 tablet 1   No current facility-administered medications for this visit.     PHYSICAL EXAMINATION: ECOG PERFORMANCE STATUS: 1 - Symptomatic but completely ambulatory  Vitals:   04/30/17 1205  BP: (!) 146/72  Pulse: 74  Resp: 18  Temp: 98.1 F (36.7 C)  SpO2: 100%   Filed Weights   04/30/17 1205  Weight: 165 lb 3.2 oz (74.9 kg)    GENERAL:alert, no distress and comfortable SKIN: skin color, texture, turgor are normal, no rashes or significant lesions EYES: normal, Conjunctiva are pink and non-injected, sclera clear OROPHARYNX:no exudate, no erythema and lips, buccal mucosa, and tongue normal  NECK: supple, thyroid normal size, non-tender, without nodularity LYMPH:  no palpable lymphadenopathy in the cervical, axillary or  inguinal LUNGS: clear to auscultation and percussion with normal breathing effort HEART: regular rate & rhythm and no murmurs and no lower extremity edema ABDOMEN:abdomen soft, non-tender and normal bowel sounds MUSCULOSKELETAL:no cyanosis of digits and no clubbing  NEURO: alert & oriented x 3 with fluent speech, no focal motor/sensory deficits EXTREMITIES: No lower extremity edema  LABORATORY DATA:  I have reviewed the data as listed   Chemistry      Component Value Date/Time   NA 138 04/30/2017 1119   K 4.1 04/30/2017 1119   CL 105 04/07/2017 0931   CO2 25 04/30/2017 1119   BUN 14.2 04/30/2017 1119   CREATININE 0.7 04/30/2017 1119      Component Value Date/Time   CALCIUM 9.1 04/30/2017 1119   ALKPHOS 87 04/30/2017 1119   AST 17 04/30/2017 1119   ALT 17 04/30/2017 1119   BILITOT 0.41 04/30/2017 1119       Lab Results  Component Value Date   WBC 12.3 (H) 04/30/2017   HGB 12.2 04/30/2017   HCT 36.8 04/30/2017   MCV 92.0 04/30/2017   PLT 159 04/30/2017   NEUTROABS 9.5 (H) 04/30/2017    ASSESSMENT & PLAN:  Carcinoma of upper-outer quadrant of right breast in female, estrogen receptor positive ([place]) 03/19/2017: Right lumpectomy: IDC with DCIS, 1.2 cm, grade 2, margins negative, 0/2 lymph nodes negative, ER 100%, PR 10%, HER-2 negative ratio 1.73, Ki-67 40% T1BN0 stage IA  CHEK 2 Mutation Positive Oncotype Dx score: 41 (28% ROR with Tamoxifen alone) Treatment plan: 1. Adjuvant chemotherapy with dose dense Adriamycin Cytoxan 4 followed by weekly Taxol 12 2. followed by adjuvant radiation 3. Followed by adjuvant antiestrogen therapy ---------------------------------------------------------------------------- Current treatment: Cycle 2 day 1 dose dense Adriamycin Cytoxan with Neulasta support in the form of Onpro.  Chemotherapy toxicities: 1. Fatigue 2. Constipation 3. Nausea I reviewed her blood work And the numbers adequate for treatment. There is no need to change  her chemotherapy dosing. Monitoring closely for toxicities  Return to clinic in 2 weeks for cycle 3   I spent 25 minutes talking to the patient of which more than half was spent in counseling and coordination of care.  No orders of the defined types were placed in this encounter.  The patient has a good understanding of the overall plan. she agrees with it. she will call with any problems that may develop before the next visit here.   Rulon Eisenmenger, MD 04/30/17

## 2017-05-05 ENCOUNTER — Telehealth: Payer: Self-pay | Admitting: *Deleted

## 2017-05-05 NOTE — Telephone Encounter (Signed)
Called patient.  "I have not taken Mira lax yet.  DO not want bowels to move at work.  Can I use a suppository?" No suppositories for pateints under active chemotherapy. Asked for return call in am ( 8:00 - 9:00 am) before work begins with update.

## 2017-05-05 NOTE — Telephone Encounter (Signed)
FYI "What do you recommend for constipation.  Last BM was Friday, it was hard to pass.  Took stool softener last night and it has not worked yet.  Yesterday abdomen was bloated.  Today I'm less bloated, passing gas, no pain.  Stayed up all night drinking water.  Never had a problem with constipation before.  I eat and drink well." Provided Medical Oncology Triage nursing orders to begin with Milk of Magnesia or Gwendolyn Lima.  Dulcolax followed by Senokot-S are next if no relief with these agents.  Asked for return call with results.  Also advised increase water intake, fiber foods, hot beverages.  Return number [phone].     "I am at work now so I will get Gwendolyn Lima at lunch."

## 2017-05-05 NOTE — Telephone Encounter (Signed)
Thank you for the FYI

## 2017-05-14 ENCOUNTER — Telehealth: Payer: Self-pay

## 2017-05-14 ENCOUNTER — Ambulatory Visit (HOSPITAL_BASED_OUTPATIENT_CLINIC_OR_DEPARTMENT_OTHER): Payer: BLUE CROSS/BLUE SHIELD

## 2017-05-14 ENCOUNTER — Ambulatory Visit: Payer: BLUE CROSS/BLUE SHIELD

## 2017-05-14 ENCOUNTER — Encounter: Payer: Self-pay | Admitting: Hematology and Oncology

## 2017-05-14 ENCOUNTER — Other Ambulatory Visit (HOSPITAL_BASED_OUTPATIENT_CLINIC_OR_DEPARTMENT_OTHER): Payer: BLUE CROSS/BLUE SHIELD

## 2017-05-14 ENCOUNTER — Ambulatory Visit (HOSPITAL_BASED_OUTPATIENT_CLINIC_OR_DEPARTMENT_OTHER): Payer: BLUE CROSS/BLUE SHIELD | Admitting: Hematology and Oncology

## 2017-05-14 ENCOUNTER — Other Ambulatory Visit: Payer: BLUE CROSS/BLUE SHIELD

## 2017-05-14 VITALS — BP 124/64 | HR 71 | Temp 98.1°F | Resp 18 | Ht 66.0 in | Wt 165.1 lb

## 2017-05-14 DIAGNOSIS — C50411 Malignant neoplasm of upper-outer quadrant of right female breast: Secondary | ICD-10-CM

## 2017-05-14 DIAGNOSIS — Z1509 Genetic susceptibility to other malignant neoplasm: Principal | ICD-10-CM

## 2017-05-14 DIAGNOSIS — Z1589 Genetic susceptibility to other disease: Principal | ICD-10-CM

## 2017-05-14 DIAGNOSIS — C50919 Malignant neoplasm of unspecified site of unspecified female breast: Secondary | ICD-10-CM

## 2017-05-14 DIAGNOSIS — Z170 Estrogen receptor positive status [ER+]: Secondary | ICD-10-CM

## 2017-05-14 DIAGNOSIS — Z5189 Encounter for other specified aftercare: Secondary | ICD-10-CM | POA: Diagnosis not present

## 2017-05-14 DIAGNOSIS — Z1502 Genetic susceptibility to malignant neoplasm of ovary: Principal | ICD-10-CM

## 2017-05-14 DIAGNOSIS — Z5111 Encounter for antineoplastic chemotherapy: Secondary | ICD-10-CM | POA: Diagnosis not present

## 2017-05-14 DIAGNOSIS — Z95828 Presence of other vascular implants and grafts: Secondary | ICD-10-CM

## 2017-05-14 LAB — COMPREHENSIVE METABOLIC PANEL
ALT: 25 U/L (ref 0–55)
AST: 23 U/L (ref 5–34)
Albumin: 3.8 g/dL (ref 3.5–5.0)
Alkaline Phosphatase: 81 U/L (ref 40–150)
Anion Gap: 10 mEq/L (ref 3–11)
BILIRUBIN TOTAL: 0.37 mg/dL (ref 0.20–1.20)
BUN: 13.8 mg/dL (ref 7.0–26.0)
CHLORIDE: 108 meq/L (ref 98–109)
CO2: 24 meq/L (ref 22–29)
Calcium: 8.9 mg/dL (ref 8.4–10.4)
Creatinine: 0.7 mg/dL (ref 0.6–1.1)
GLUCOSE: 102 mg/dL (ref 70–140)
Potassium: 3.9 mEq/L (ref 3.5–5.1)
SODIUM: 142 meq/L (ref 136–145)
TOTAL PROTEIN: 6.7 g/dL (ref 6.4–8.3)

## 2017-05-14 LAB — CBC WITH DIFFERENTIAL/PLATELET
BASO%: 1.4 % (ref 0.0–2.0)
Basophils Absolute: 0.2 10*3/uL — ABNORMAL HIGH (ref 0.0–0.1)
EOS%: 0.1 % (ref 0.0–7.0)
Eosinophils Absolute: 0 10*3/uL (ref 0.0–0.5)
HCT: 35.1 % (ref 34.8–46.6)
HGB: 11.6 g/dL (ref 11.6–15.9)
LYMPH%: 13 % — AB (ref 14.0–49.7)
MCH: 30.5 pg (ref 25.1–34.0)
MCHC: 33 g/dL (ref 31.5–36.0)
MCV: 92.4 fL (ref 79.5–101.0)
MONO#: 0.8 10*3/uL (ref 0.1–0.9)
MONO%: 7.3 % (ref 0.0–14.0)
NEUT%: 78.2 % — ABNORMAL HIGH (ref 38.4–76.8)
NEUTROS ABS: 8.1 10*3/uL — AB (ref 1.5–6.5)
Platelets: 169 10*3/uL (ref 145–400)
RBC: 3.8 10*6/uL (ref 3.70–5.45)
RDW: 13.7 % (ref 11.2–14.5)
WBC: 10.4 10*3/uL — AB (ref 3.9–10.3)
lymph#: 1.4 10*3/uL (ref 0.9–3.3)

## 2017-05-14 MED ORDER — FOSAPREPITANT DIMEGLUMINE INJECTION 150 MG
Freq: Once | INTRAVENOUS | Status: AC
  Administered 2017-05-14: 10:00:00 via INTRAVENOUS
  Filled 2017-05-14: qty 5

## 2017-05-14 MED ORDER — HEPARIN SOD (PORK) LOCK FLUSH 100 UNIT/ML IV SOLN
500.0000 [IU] | Freq: Once | INTRAVENOUS | Status: AC | PRN
  Administered 2017-05-14: 500 [IU]
  Filled 2017-05-14: qty 5

## 2017-05-14 MED ORDER — PALONOSETRON HCL INJECTION 0.25 MG/5ML
0.2500 mg | Freq: Once | INTRAVENOUS | Status: AC
  Administered 2017-05-14: 0.25 mg via INTRAVENOUS

## 2017-05-14 MED ORDER — SODIUM CHLORIDE 0.9 % IV SOLN
600.0000 mg/m2 | Freq: Once | INTRAVENOUS | Status: AC
  Administered 2017-05-14: 1120 mg via INTRAVENOUS
  Filled 2017-05-14: qty 56

## 2017-05-14 MED ORDER — PEGFILGRASTIM 6 MG/0.6ML ~~LOC~~ PSKT
6.0000 mg | PREFILLED_SYRINGE | Freq: Once | SUBCUTANEOUS | Status: AC
  Administered 2017-05-14: 6 mg via SUBCUTANEOUS
  Filled 2017-05-14: qty 0.6

## 2017-05-14 MED ORDER — DOXORUBICIN HCL CHEMO IV INJECTION 2 MG/ML
60.0000 mg/m2 | Freq: Once | INTRAVENOUS | Status: AC
  Administered 2017-05-14: 112 mg via INTRAVENOUS
  Filled 2017-05-14: qty 56

## 2017-05-14 MED ORDER — SODIUM CHLORIDE 0.9% FLUSH
10.0000 mL | INTRAVENOUS | Status: DC | PRN
  Administered 2017-05-14: 10 mL
  Filled 2017-05-14: qty 10

## 2017-05-14 MED ORDER — PALONOSETRON HCL INJECTION 0.25 MG/5ML
INTRAVENOUS | Status: AC
  Filled 2017-05-14: qty 5

## 2017-05-14 MED ORDER — SODIUM CHLORIDE 0.9% FLUSH
10.0000 mL | Freq: Once | INTRAVENOUS | Status: AC
  Administered 2017-05-14: 10 mL
  Filled 2017-05-14: qty 10

## 2017-05-14 MED ORDER — SODIUM CHLORIDE 0.9 % IV SOLN
Freq: Once | INTRAVENOUS | Status: AC
  Administered 2017-05-14: 09:00:00 via INTRAVENOUS

## 2017-05-14 NOTE — Patient Instructions (Signed)
[facility] Discharge Instructions for Patients Receiving Chemotherapy  Today you received the following chemotherapy agents Adriamycin/Cytoxan.  To help prevent nausea and vomiting after your treatment, we encourage you to take your nausea medication as prescribed.    If you develop nausea and vomiting that is not controlled by your nausea medication, call the clinic.   BELOW ARE SYMPTOMS THAT SHOULD BE REPORTED IMMEDIATELY:  *FEVER GREATER THAN 100.5 F  *CHILLS WITH OR WITHOUT FEVER  NAUSEA AND VOMITING THAT IS NOT CONTROLLED WITH YOUR NAUSEA MEDICATION  *UNUSUAL SHORTNESS OF BREATH  *UNUSUAL BRUISING OR BLEEDING  TENDERNESS IN MOUTH AND THROAT WITH OR WITHOUT PRESENCE OF ULCERS  *URINARY PROBLEMS  *BOWEL PROBLEMS  UNUSUAL RASH Items with * indicate a potential emergency and should be followed up as soon as possible.  Feel free to call the clinic you have any questions or concerns. The clinic phone number is [phone].  Please show the CHEMO ALERT CARD at check-in to the Emergency Department and triage nurse.   

## 2017-05-14 NOTE — Assessment & Plan Note (Signed)
03/19/2017: Right lumpectomy: IDC with DCIS, 1.2 cm, grade 2, margins negative, 0/2 lymph nodes negative, ER 100%, PR 10%, HER-2 negative ratio 1.73, Ki-67 40% T1BN0 stage IA CHEK 2 Mutation Positive Oncotype Dx score: 41 (28% ROR with Tamoxifen alone) Treatment plan: 1. Adjuvant chemotherapy with dose dense Adriamycin Cytoxan 4 followed by weekly Taxol 12 2. followed by adjuvant radiation 3. Followed by adjuvant antiestrogen therapy ---------------------------------------------------------------------------- Current treatment: Cycle 3 day 1dose dense Adriamycin Cytoxan with Neulasta support in the form of Onpro.  Chemotherapy toxicities: 1. Fatigue 2. Constipation 3. Nausea I reviewed her blood work And the numbers adequate for treatment. There is no need to change her chemotherapy dosing. Monitoring closely for toxicities  Return to clinic in 2 weeks for cycle 4 

## 2017-05-14 NOTE — Telephone Encounter (Signed)
Pt called asking when her onpro can be removed. Discussed time of injection, lights out and line is on empty.

## 2017-05-14 NOTE — Progress Notes (Signed)
Patient Care Team: [name], MD as PCP - General (Internal Medicine) [name], MD (Internal Medicine) Arta Silence, MD as Consulting Physician (Gastroenterology) Arvella Nigh, MD as Consulting Physician (Obstetrics and Gynecology)  DIAGNOSIS:  Encounter Diagnoses  Name Primary?  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  The STAT Breast cancer panel offered by [facility] includes sequencing and rearrangement analysis for the following 9 genes:  ATM, BRCA1, BRCA2, CDH1, CHEK2, PALB2, PTEN, STK11 and TP53.   The report date is March 09, 2017.  Re-requitisioned to the common hereditary cancer panel and the melanoma panel.  The additional genes tested were negative.  The Hereditary Gene Panel offered by [facility] includes sequencing and/or deletion duplication testing of the following 46 genes: APC, ATM, AXIN2, BARD1, BMPR1A, BRCA1, BRCA2, BRIP1, CDH1, CDKN2A (p14ARF), CDKN2A (p16INK4a), CHEK2, CTNNA1, DICER1, EPCAM (Deletion/duplication testing only), GREM1 (promoter region deletion/duplication testing only), KIT, MEN1, MLH1, MSH2, MSH3, MSH6, MUTYH, NBN, NF1, NHTL1, PALB2, PDGFRA, PMS2, POLD1, POLE, PTEN, RAD50, RAD51C, RAD51D, SDHB, SDHC, SDHD, SMAD4, SMARCA4. STK11,  TP53, TSC1, TSC2, and VHL.  The following genes were evaluated for sequence changes only: SDHA and HOXB13 c.251G>A variant only.  The Melanoma panel offered by [facility] includes sequencing and/or deletion duplication testing of the following 12 genes: BAP1, BRCA1, BRCA2, BRIP1, CDK4, CDKN2A (p14ARF), CDKN2A (p16INK4a), MC1R, POT1, PTEN, RB1, TERT, and TP53.  The following gene was evaluated for sequence changes only: MITF (c.952G>A, p.GLU318Lys variant only). The updated report date is March 14, 2017.        03/19/2017 Surgery    Right lumpectomy: IDC with DCIS, 1.2 cm, grade 2, margins negative, 0/2 lymph nodes negative, ER 100%, PR 10%, HER-2 negative ratio 1.73, Ki-67 40% T1BN0 stage IA      04/01/2017 Oncotype testing    Oncotype Dx score: 41 (28% ROR with Tamoxifen alone)      04/16/2017 -  Chemotherapy    Dose dense Adriamycin and Cytoxan 4 followed by Taxol weekly 12        CHIEF COMPLIANT: Cycle 3 dose dense Adriamycin and Cytoxan  INTERVAL HISTORY: Rebecca [name] is a 61 year old with above-mentioned history of right breast cancer currently on adjuvant chemotherapy and today is cycle 3 of chemotherapy. She has tolerated chemotherapy extremely well. She does not have any nausea vomiting. She continues to eat well and has had good appetite and taste. She is working full-time and appears to be handling both work and home along with chemotherapy very well. Denies any mouth sores.  REVIEW OF SYSTEMS:   Constitutional: Denies fevers, chills or abnormal weight loss Eyes: Denies blurriness of vision Ears, nose, mouth, throat, and face: Denies mucositis or sore throat, had hair loss Respiratory: Denies cough, dyspnea or  wheezes Cardiovascular: Denies palpitation, chest discomfort Gastrointestinal:  Denies nausea, heartburn or change in bowel habits Skin: Denies abnormal skin rashes Lymphatics: Denies new lymphadenopathy or easy bruising Neurological:Denies numbness, tingling or new  weaknesses Behavioral/Psych: Mood is stable, no new changes  Extremities: No lower extremity edema Breast:  denies any pain or lumps or nodules in either breasts All other systems were reviewed with the patient and are negative.  I have reviewed the past medical history, past surgical history, social history and family history with the patient and they are unchanged from previous note.  ALLERGIES:  is allergic to citalopram; citalopram; dexamethasone; dexamethasone; dilaudid [hydromorphone hcl]; propoxycaine; red yeast rice [cholestin]; rofecoxib; and sulfamethoxazole.  MEDICATIONS:  Current Outpatient Prescriptions  Medication Sig Dispense Refill  . dexamethasone (DECADRON) 4 MG tablet Take 1 tablet (4 mg total) by mouth daily. Take 1 tablet with food daily for 3 days starting day after chemotherapy 12 tablet 0  . lidocaine-prilocaine (EMLA) cream Apply to affected area once 30 g 3  . LORazepam (ATIVAN) 0.5 MG tablet Take 1 tablet (0.5 mg total) by mouth at bedtime. As needed 30 tablet 0  . ondansetron (ZOFRAN) 8 MG tablet Take 1 tablet (8 mg total) by mouth 2 (two) times daily as needed. Start on the third day after chemotherapy. 30 tablet 1  . prochlorperazine (COMPAZINE) 10 MG tablet Take 1 tablet (10 mg total) by mouth every 6 (six) hours as needed (Nausea or vomiting). 30 tablet 1   No current facility-administered medications for this visit.     PHYSICAL EXAMINATION: ECOG PERFORMANCE STATUS: 1 - Symptomatic but completely ambulatory  Vitals:   05/14/17 0813  BP: 124/64  Pulse: 71  Resp: 18  Temp: 98.1 F (36.7 C)  SpO2: 99%   Filed Weights   05/14/17 0813  Weight: 165 lb 1.6 oz (74.9 kg)    GENERAL:alert, no distress and comfortable SKIN: skin color, texture, turgor are normal, no rashes or significant lesions EYES: normal, Conjunctiva are pink and non-injected, sclera clear OROPHARYNX:no exudate, no erythema and lips, buccal mucosa, and tongue normal  NECK: supple,  thyroid normal size, non-tender, without nodularity LYMPH:  no palpable lymphadenopathy in the cervical, axillary or inguinal LUNGS: clear to auscultation and percussion with normal breathing effort HEART: regular rate & rhythm and no murmurs and no lower extremity edema ABDOMEN:abdomen soft, non-tender and normal bowel sounds MUSCULOSKELETAL:no cyanosis of digits and no clubbing  NEURO: alert & oriented x 3 with fluent speech, no focal motor/sensory deficits EXTREMITIES: No lower extremity edema BREAST: No palpable masses or nodules in either right or left breasts. No palpable axillary supraclavicular or infraclavicular adenopathy no breast tenderness or nipple discharge. (exam performed in the presence of a chaperone)  LABORATORY DATA:  I have reviewed the data as listed   Chemistry      Component Value Date/Time   NA 138 04/30/2017 1119   K 4.1 04/30/2017 1119   CL 105 04/07/2017 0931   CO2 25 04/30/2017 1119   BUN 14.2 04/30/2017 1119   CREATININE 0.7 04/30/2017 1119      Component Value Date/Time   CALCIUM 9.1 04/30/2017 1119   ALKPHOS 87 04/30/2017 1119   AST 17 04/30/2017 1119   ALT 17 04/30/2017 1119   BILITOT 0.41 04/30/2017 1119       Lab Results  Component Value Date   WBC 10.4 (H) 05/14/2017   HGB 11.6 05/14/2017   HCT 35.1 05/14/2017   MCV 92.4 05/14/2017   PLT  169 05/14/2017   NEUTROABS 8.1 (H) 05/14/2017    ASSESSMENT & PLAN:  Carcinoma of upper-outer quadrant of right breast in female, estrogen receptor positive ([place]) 03/19/2017: Right lumpectomy: IDC with DCIS, 1.2 cm, grade 2, margins negative, 0/2 lymph nodes negative, ER 100%, PR 10%, HER-2 negative ratio 1.73, Ki-67 40% T1BN0 stage IA CHEK 2 Mutation Positive Oncotype Dx score: 41 (28% ROR with Tamoxifen alone) Treatment plan: 1. Adjuvant chemotherapy with dose dense Adriamycin Cytoxan 4 followed by weekly Taxol 12 2. followed by adjuvant radiation 3. Followed by adjuvant antiestrogen  therapy ---------------------------------------------------------------------------- Current treatment: Cycle 3 day 1dose dense Adriamycin Cytoxan with Neulasta support in the form of Onpro.  Chemotherapy toxicities: 1. Fatigue: Able to manage work and life very well 2. Constipation: Resolved 3. Nausea: Resolved 4. Alopecia  I reviewed her blood work  Monitoring closely for toxicities  Return to clinic in 2 weeks for cycle 4  I spent 25 minutes talking to the patient of which more than half was spent in counseling and coordination of care.  No orders of the defined types were placed in this encounter.  The patient has a good understanding of the overall plan. she agrees with it. she will call with any problems that may develop before the next visit here.   Rulon Eisenmenger, MD 05/14/17

## 2017-05-23 ENCOUNTER — Telehealth: Payer: Self-pay | Admitting: Hematology and Oncology

## 2017-05-23 NOTE — Telephone Encounter (Signed)
Called patient regarding updated schedule

## 2017-05-28 ENCOUNTER — Ambulatory Visit (HOSPITAL_BASED_OUTPATIENT_CLINIC_OR_DEPARTMENT_OTHER): Payer: BLUE CROSS/BLUE SHIELD | Admitting: Hematology and Oncology

## 2017-05-28 ENCOUNTER — Ambulatory Visit: Payer: BLUE CROSS/BLUE SHIELD

## 2017-05-28 ENCOUNTER — Ambulatory Visit (HOSPITAL_BASED_OUTPATIENT_CLINIC_OR_DEPARTMENT_OTHER): Payer: BLUE CROSS/BLUE SHIELD

## 2017-05-28 ENCOUNTER — Other Ambulatory Visit (HOSPITAL_BASED_OUTPATIENT_CLINIC_OR_DEPARTMENT_OTHER): Payer: BLUE CROSS/BLUE SHIELD

## 2017-05-28 ENCOUNTER — Encounter: Payer: Self-pay | Admitting: *Deleted

## 2017-05-28 DIAGNOSIS — C50411 Malignant neoplasm of upper-outer quadrant of right female breast: Secondary | ICD-10-CM

## 2017-05-28 DIAGNOSIS — Z170 Estrogen receptor positive status [ER+]: Secondary | ICD-10-CM

## 2017-05-28 DIAGNOSIS — Z0000 Encounter for general adult medical examination without abnormal findings: Secondary | ICD-10-CM

## 2017-05-28 DIAGNOSIS — Z95828 Presence of other vascular implants and grafts: Secondary | ICD-10-CM

## 2017-05-28 DIAGNOSIS — Z5189 Encounter for other specified aftercare: Secondary | ICD-10-CM

## 2017-05-28 DIAGNOSIS — Z5111 Encounter for antineoplastic chemotherapy: Secondary | ICD-10-CM

## 2017-05-28 LAB — COMPREHENSIVE METABOLIC PANEL
ALBUMIN: 4.1 g/dL (ref 3.5–5.0)
ALK PHOS: 81 U/L (ref 40–150)
ALT: 24 U/L (ref 0–55)
ANION GAP: 8 meq/L (ref 3–11)
AST: 22 U/L (ref 5–34)
BILIRUBIN TOTAL: 0.46 mg/dL (ref 0.20–1.20)
BUN: 12.5 mg/dL (ref 7.0–26.0)
CALCIUM: 9.1 mg/dL (ref 8.4–10.4)
CO2: 25 meq/L (ref 22–29)
Chloride: 106 mEq/L (ref 98–109)
Creatinine: 0.6 mg/dL (ref 0.6–1.1)
Glucose: 87 mg/dl (ref 70–140)
Potassium: 4 mEq/L (ref 3.5–5.1)
Sodium: 140 mEq/L (ref 136–145)
TOTAL PROTEIN: 6.9 g/dL (ref 6.4–8.3)

## 2017-05-28 LAB — CBC WITH DIFFERENTIAL/PLATELET
BASO%: 0.9 % (ref 0.0–2.0)
BASOS ABS: 0.1 10*3/uL (ref 0.0–0.1)
EOS ABS: 0 10*3/uL (ref 0.0–0.5)
EOS%: 0.2 % (ref 0.0–7.0)
HEMATOCRIT: 32.5 % — AB (ref 34.8–46.6)
HGB: 10.8 g/dL — ABNORMAL LOW (ref 11.6–15.9)
LYMPH%: 11 % — ABNORMAL LOW (ref 14.0–49.7)
MCH: 30.7 pg (ref 25.1–34.0)
MCHC: 33.2 g/dL (ref 31.5–36.0)
MCV: 92.3 fL (ref 79.5–101.0)
MONO#: 0.8 10*3/uL (ref 0.1–0.9)
MONO%: 7.2 % (ref 0.0–14.0)
NEUT#: 9.4 10*3/uL — ABNORMAL HIGH (ref 1.5–6.5)
NEUT%: 80.7 % — ABNORMAL HIGH (ref 38.4–76.8)
NRBC: 0 % (ref 0–0)
PLATELETS: 195 10*3/uL (ref 145–400)
RBC: 3.52 10*6/uL — AB (ref 3.70–5.45)
RDW: 15.1 % — AB (ref 11.2–14.5)
WBC: 11.7 10*3/uL — ABNORMAL HIGH (ref 3.9–10.3)
lymph#: 1.3 10*3/uL (ref 0.9–3.3)

## 2017-05-28 MED ORDER — SODIUM CHLORIDE 0.9% FLUSH
10.0000 mL | Freq: Once | INTRAVENOUS | Status: AC
  Administered 2017-05-28: 10 mL
  Filled 2017-05-28: qty 10

## 2017-05-28 MED ORDER — PALONOSETRON HCL INJECTION 0.25 MG/5ML
0.2500 mg | Freq: Once | INTRAVENOUS | Status: AC
  Administered 2017-05-28: 0.25 mg via INTRAVENOUS

## 2017-05-28 MED ORDER — PEGFILGRASTIM 6 MG/0.6ML ~~LOC~~ PSKT
6.0000 mg | PREFILLED_SYRINGE | Freq: Once | SUBCUTANEOUS | Status: AC
  Administered 2017-05-28: 6 mg via SUBCUTANEOUS
  Filled 2017-05-28: qty 0.6

## 2017-05-28 MED ORDER — SODIUM CHLORIDE 0.9 % IV SOLN
600.0000 mg/m2 | Freq: Once | INTRAVENOUS | Status: AC
  Administered 2017-05-28: 1120 mg via INTRAVENOUS
  Filled 2017-05-28: qty 56

## 2017-05-28 MED ORDER — SODIUM CHLORIDE 0.9 % IV SOLN
Freq: Once | INTRAVENOUS | Status: AC
  Administered 2017-05-28: 12:00:00 via INTRAVENOUS

## 2017-05-28 MED ORDER — SODIUM CHLORIDE 0.9 % IV SOLN
Freq: Once | INTRAVENOUS | Status: AC
  Administered 2017-05-28: 12:00:00 via INTRAVENOUS
  Filled 2017-05-28: qty 5

## 2017-05-28 MED ORDER — HEPARIN SOD (PORK) LOCK FLUSH 100 UNIT/ML IV SOLN
500.0000 [IU] | Freq: Once | INTRAVENOUS | Status: AC | PRN
  Administered 2017-05-28: 500 [IU]
  Filled 2017-05-28: qty 5

## 2017-05-28 MED ORDER — DOXORUBICIN HCL CHEMO IV INJECTION 2 MG/ML
60.0000 mg/m2 | Freq: Once | INTRAVENOUS | Status: AC
  Administered 2017-05-28: 112 mg via INTRAVENOUS
  Filled 2017-05-28: qty 56

## 2017-05-28 MED ORDER — SODIUM CHLORIDE 0.9% FLUSH
10.0000 mL | INTRAVENOUS | Status: DC | PRN
  Administered 2017-05-28: 10 mL
  Filled 2017-05-28: qty 10

## 2017-05-28 MED ORDER — PALONOSETRON HCL INJECTION 0.25 MG/5ML
INTRAVENOUS | Status: AC
  Filled 2017-05-28: qty 5

## 2017-05-28 NOTE — Patient Instructions (Signed)
Implanted Port Home Guide An implanted port is a type of central line that is placed under the skin. Central lines are used to provide IV access when treatment or nutrition needs to be given through a person's veins. Implanted ports are used for long-term IV access. An implanted port may be placed because:  You need IV medicine that would be irritating to the small veins in your hands or arms.  You need long-term IV medicines, such as antibiotics.  You need IV nutrition for a long period.  You need frequent blood draws for lab tests.  You need dialysis.  Implanted ports are usually placed in the chest area, but they can also be placed in the upper arm, the abdomen, or the leg. An implanted port has two main parts:  Reservoir. The reservoir is round and will appear as a small, raised area under your skin. The reservoir is the part where a needle is inserted to give medicines or draw blood.  Catheter. The catheter is a thin, flexible tube that extends from the reservoir. The catheter is placed into a large vein. Medicine that is inserted into the reservoir goes into the catheter and then into the vein.  How will I care for my incision site? Do not get the incision site wet. Bathe or shower as directed by your health care provider. How is my port accessed? Special steps must be taken to access the port:  Before the port is accessed, a numbing cream can be placed on the skin. This helps numb the skin over the port site.  Your health care provider uses a sterile technique to access the port. ? Your health care provider must put on a mask and sterile gloves. ? The skin over your port is cleaned carefully with an antiseptic and allowed to dry. ? The port is gently pinched between sterile gloves, and a needle is inserted into the port.  Only "non-coring" port needles should be used to access the port. Once the port is accessed, a blood return should be checked. This helps ensure that the port  is in the vein and is not clogged.  If your port needs to remain accessed for a constant infusion, a clear (transparent) bandage will be placed over the needle site. The bandage and needle will need to be changed every week, or as directed by your health care provider.  Keep the bandage covering the needle clean and dry. Do not get it wet. Follow your health care provider's instructions on how to take a shower or bath while the port is accessed.  If your port does not need to stay accessed, no bandage is needed over the port.  What is flushing? Flushing helps keep the port from getting clogged. Follow your health care provider's instructions on how and when to flush the port. Ports are usually flushed with saline solution or a medicine called heparin. The need for flushing will depend on how the port is used.  If the port is used for intermittent medicines or blood draws, the port will need to be flushed: ? After medicines have been given. ? After blood has been drawn. ? As part of routine maintenance.  If a constant infusion is running, the port may not need to be flushed.  How long will my port stay implanted? The port can stay in for as long as your health care provider thinks it is needed. When it is time for the port to come out, surgery will be   done to remove it. The procedure is similar to the one performed when the port was put in. When should I seek immediate medical care? When you have an implanted port, you should seek immediate medical care if:  You notice a bad smell coming from the incision site.  You have swelling, redness, or drainage at the incision site.  You have more swelling or pain at the port site or the surrounding area.  You have a fever that is not controlled with medicine.  This information is not intended to replace advice given to you by your health care provider. Make sure you discuss any questions you have with your health care provider. Document  Released: 08/12/2005 Document Revised: 01/18/2016 Document Reviewed: 04/19/2013 Elsevier Interactive Patient Education  2017 [employer] Inc.  

## 2017-05-28 NOTE — Patient Instructions (Signed)
[facility] Discharge Instructions for Patients Receiving Chemotherapy  Today you received the following chemotherapy agents Adriamycin and Cytoxan  To help prevent nausea and vomiting after your treatment, we encourage you to take your nausea medication as directed.  If you develop nausea and vomiting that is not controlled by your nausea medication, call the clinic.   BELOW ARE SYMPTOMS THAT SHOULD BE REPORTED IMMEDIATELY:  *FEVER GREATER THAN 100.5 F  *CHILLS WITH OR WITHOUT FEVER  NAUSEA AND VOMITING THAT IS NOT CONTROLLED WITH YOUR NAUSEA MEDICATION  *UNUSUAL SHORTNESS OF BREATH  *UNUSUAL BRUISING OR BLEEDING  TENDERNESS IN MOUTH AND THROAT WITH OR WITHOUT PRESENCE OF ULCERS  *URINARY PROBLEMS  *BOWEL PROBLEMS  UNUSUAL RASH Items with * indicate a potential emergency and should be followed up as soon as possible.  Feel free to call the clinic should you have any questions or concerns. The clinic phone number is [phone].  Please show the CHEMO ALERT CARD at check-in to the Emergency Department and triage nurse.   

## 2017-05-29 ENCOUNTER — Encounter: Payer: Self-pay | Admitting: Hematology and Oncology

## 2017-05-29 NOTE — Progress Notes (Signed)
Patient Care Team: [name], MD as PCP - General (Internal Medicine) [name], MD (Internal Medicine) Arta Silence, MD as Consulting Physician (Gastroenterology) Arvella Nigh, MD as Consulting Physician (Obstetrics and Gynecology)  DIAGNOSIS:  Encounter Diagnosis  Name Primary?  . 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  The STAT Breast cancer panel offered by [facility] includes sequencing and rearrangement analysis for the following 9 genes:  ATM, BRCA1, BRCA2, CDH1, CHEK2, PALB2, PTEN, STK11 and TP53.   The report date is March 09, 2017.  Re-requitisioned to the common hereditary cancer panel and the melanoma panel.  The additional genes tested were negative.  The Hereditary Gene Panel offered by [facility] includes sequencing and/or deletion duplication testing of the following 46 genes: APC, ATM, AXIN2, BARD1, BMPR1A, BRCA1, BRCA2, BRIP1, CDH1, CDKN2A (p14ARF), CDKN2A (p16INK4a), CHEK2, CTNNA1, DICER1, EPCAM (Deletion/duplication testing only), GREM1 (promoter region deletion/duplication testing only), KIT, MEN1, MLH1, MSH2, MSH3, MSH6, MUTYH, NBN, NF1, NHTL1, PALB2, PDGFRA, PMS2, POLD1, POLE, PTEN, RAD50, RAD51C, RAD51D, SDHB, SDHC, SDHD, SMAD4, SMARCA4. STK11, TP53, TSC1, TSC2, and VHL.  The following  genes were evaluated for sequence changes only: SDHA and HOXB13 c.251G>A variant only.  The Melanoma panel offered by [facility] includes sequencing and/or deletion duplication testing of the following 12 genes: BAP1, BRCA1, BRCA2, BRIP1, CDK4, CDKN2A (p14ARF), CDKN2A (p16INK4a), MC1R, POT1, PTEN, RB1, TERT, and TP53.  The following gene was evaluated for sequence changes only: MITF (c.952G>A, p.GLU318Lys variant only). The updated report date is March 14, 2017.        03/19/2017 Surgery    Right lumpectomy: IDC with DCIS, 1.2 cm, grade 2, margins negative, 0/2 lymph nodes negative, ER 100%, PR 10%, HER-2 negative ratio 1.73, Ki-67 40% T1BN0 stage IA      04/01/2017 Oncotype testing    Oncotype Dx score: 41 (28% ROR with Tamoxifen alone)      04/16/2017 -  Chemotherapy    Dose dense Adriamycin and Cytoxan 4 followed by Taxol weekly 12        CHIEF COMPLIANT: Cycle 4 of dose dense Adriamycin and Cytoxan  INTERVAL HISTORY: Rebecca [name] is a 61 year old with above-mentioned history of left breast cancer treated with lumpectomy and is currently on adjuvant chemotherapy and today's cycle 4 of dose dense Adriamycin Cytoxan. She tolerated cycle 3 recently well. She did have fatigue that lasted several days. She also had mild nausea for which she took antiemetics which resolved her nausea issues. She still has good appetite and is eating well.  REVIEW OF SYSTEMS:   Constitutional: Denies fevers, chills or abnormal weight loss Eyes: Denies blurriness of vision Ears, nose, mouth, throat, and face: Denies mucositis or sore throat Respiratory: Denies cough, dyspnea or wheezes Cardiovascular: Denies palpitation, chest discomfort Gastrointestinal:  Denies nausea, heartburn  or change in bowel habits Skin: Denies abnormal skin rashes Lymphatics: Denies new lymphadenopathy or easy bruising Neurological:Denies numbness, tingling or new weaknesses Behavioral/Psych: Mood is stable, no new changes    Extremities: No lower extremity edema Breast:  denies any pain or lumps or nodules in either breasts All other systems were reviewed with the patient and are negative.  I have reviewed the past medical history, past surgical history, social history and family history with the patient and they are unchanged from previous note.  ALLERGIES:  is allergic to citalopram; citalopram; dexamethasone; dexamethasone; dilaudid [hydromorphone hcl]; propoxycaine; red yeast rice [cholestin]; rofecoxib; and sulfamethoxazole.  MEDICATIONS:  Current Outpatient Prescriptions  Medication Sig Dispense Refill  . dexamethasone (DECADRON) 4 MG tablet Take 1 tablet (4 mg total) by mouth daily. Take 1 tablet with food daily for 3 days starting day after chemotherapy 12 tablet 0  . lidocaine-prilocaine (EMLA) cream Apply to affected area once 30 g 3  . LORazepam (ATIVAN) 0.5 MG tablet Take 1 tablet (0.5 mg total) by mouth at bedtime. As needed 30 tablet 0  . ondansetron (ZOFRAN) 8 MG tablet Take 1 tablet (8 mg total) by mouth 2 (two) times daily as needed. Start on the third day after chemotherapy. 30 tablet 1  . prochlorperazine (COMPAZINE) 10 MG tablet Take 1 tablet (10 mg total) by mouth every 6 (six) hours as needed (Nausea or vomiting). 30 tablet 1   No current facility-administered medications for this visit.     PHYSICAL EXAMINATION: ECOG PERFORMANCE STATUS: 1 - Symptomatic but completely ambulatory  Vitals:   05/28/17 1113  BP: (!) 145/64  Pulse: 71  Resp: 18  Temp: 98 F (36.7 C)  SpO2: 99%   Filed Weights   05/28/17 1113  Weight: 166 lb 8 oz (75.5 kg)    GENERAL:alert, no distress and comfortable SKIN: skin color, texture, turgor are normal, no rashes or significant lesions EYES: normal, Conjunctiva are pink and non-injected, sclera clear OROPHARYNX:no exudate, no erythema and lips, buccal mucosa, and tongue normal  NECK: supple, thyroid normal size, non-tender, without nodularity LYMPH:   no palpable lymphadenopathy in the cervical, axillary or inguinal LUNGS: clear to auscultation and percussion with normal breathing effort HEART: regular rate & rhythm and no murmurs and no lower extremity edema ABDOMEN:abdomen soft, non-tender and normal bowel sounds MUSCULOSKELETAL:no cyanosis of digits and no clubbing  NEURO: alert & oriented x 3 with fluent speech, no focal motor/sensory deficits EXTREMITIES: No lower extremity edema  LABORATORY DATA:  I have reviewed the data as listed   Chemistry      Component Value Date/Time   NA 140 05/28/2017 1038   K 4.0 05/28/2017 1038   CL 105 04/07/2017 0931   CO2 25 05/28/2017 1038   BUN 12.5 05/28/2017 1038   CREATININE 0.6 05/28/2017 1038      Component Value Date/Time   CALCIUM 9.1 05/28/2017 1038   ALKPHOS 81 05/28/2017 1038   AST 22 05/28/2017 1038   ALT 24 05/28/2017 1038   BILITOT 0.46 05/28/2017 1038       Lab Results  Component Value Date   WBC 11.7 (H) 05/28/2017   HGB 10.8 (L) 05/28/2017   HCT 32.5 (L) 05/28/2017   MCV 92.3 05/28/2017   PLT 195 05/28/2017   NEUTROABS 9.4 (H) 05/28/2017    ASSESSMENT & PLAN:  Carcinoma of upper-outer quadrant of right breast in female, estrogen receptor positive ([place]) 03/19/2017: Right lumpectomy: IDC with DCIS, 1.2 cm, grade 2, margins negative,  0/2 lymph nodes negative, ER 100%, PR 10%, HER-2 negative ratio 1.73, Ki-67 40% T1BN0 stage IA CHEK 2 Mutation Positive Oncotype Dx score: 41 (28% ROR with Tamoxifen alone) Treatment plan: 1. Adjuvant chemotherapy with dose dense Adriamycin Cytoxan 4 followed by weekly Taxol 12 2. followed by adjuvant radiation 3. Followed by adjuvant antiestrogen therapy ---------------------------------------------------------------------------- Current treatment: Cycle 4 day 1dose dense Adriamycin Cytoxan with Neulasta support in the form of Onpro.  Chemotherapy toxicities: 1. Fatigue 2. Constipation 3. Nausea I reviewed her blood work  And the numbers adequate for treatment. There is no need to change her chemotherapy dosing. Monitoring closely for toxicities  Return to clinic in 2 weeks for cycle 1 Taxol   I spent 25 minutes talking to the patient of which more than half was spent in counseling and coordination of care.  No orders of the defined types were placed in this encounter.  The patient has a good understanding of the overall plan. she agrees with it. she will call with any problems that may develop before the next visit here.   Rulon Eisenmenger, MD 05/29/17

## 2017-05-29 NOTE — Assessment & Plan Note (Signed)
03/19/2017: Right lumpectomy: IDC with DCIS, 1.2 cm, grade 2, margins negative, 0/2 lymph nodes negative, ER 100%, PR 10%, HER-2 negative ratio 1.73, Ki-67 40% T1BN0 stage IA CHEK 2 Mutation Positive Oncotype Dx score: 41 (28% ROR with Tamoxifen alone) Treatment plan: 1. Adjuvant chemotherapy with dose dense Adriamycin Cytoxan 4 followed by weekly Taxol 12 2. followed by adjuvant radiation 3. Followed by adjuvant antiestrogen therapy ---------------------------------------------------------------------------- Current treatment: Cycle 4 day 1dose dense Adriamycin Cytoxan with Neulasta support in the form of Onpro.  Chemotherapy toxicities: 1. Fatigue 2. Constipation 3. Nausea I reviewed her blood work And the numbers adequate for treatment. There is no need to change her chemotherapy dosing. Monitoring closely for toxicities  Return to clinic in 2 weeks for cycle 1 Taxol

## 2017-06-06 ENCOUNTER — Telehealth: Payer: Self-pay | Admitting: Hematology and Oncology

## 2017-06-06 NOTE — Telephone Encounter (Signed)
Called patient regarding October

## 2017-06-09 ENCOUNTER — Other Ambulatory Visit: Payer: Self-pay | Admitting: Hematology and Oncology

## 2017-06-09 ENCOUNTER — Other Ambulatory Visit: Payer: Self-pay

## 2017-06-09 DIAGNOSIS — C50411 Malignant neoplasm of upper-outer quadrant of right female breast: Secondary | ICD-10-CM

## 2017-06-09 DIAGNOSIS — Z170 Estrogen receptor positive status [ER+]: Secondary | ICD-10-CM

## 2017-06-09 MED ORDER — LORAZEPAM 0.5 MG PO TABS: 0.5000 mg | ORAL_TABLET | Freq: Every day | ORAL | 0 refills | Status: DC

## 2017-06-09 MED ORDER — DEXAMETHASONE 4 MG PO TABS
4.0000 mg | ORAL_TABLET | Freq: Every day | ORAL | 0 refills | Status: DC
Start: 2017-06-09 — End: 2017-07-16

## 2017-06-11 ENCOUNTER — Ambulatory Visit (HOSPITAL_BASED_OUTPATIENT_CLINIC_OR_DEPARTMENT_OTHER): Payer: BLUE CROSS/BLUE SHIELD

## 2017-06-11 ENCOUNTER — Ambulatory Visit: Payer: BLUE CROSS/BLUE SHIELD

## 2017-06-11 ENCOUNTER — Ambulatory Visit (HOSPITAL_BASED_OUTPATIENT_CLINIC_OR_DEPARTMENT_OTHER): Payer: BLUE CROSS/BLUE SHIELD | Admitting: Adult Health

## 2017-06-11 ENCOUNTER — Other Ambulatory Visit (HOSPITAL_BASED_OUTPATIENT_CLINIC_OR_DEPARTMENT_OTHER): Payer: BLUE CROSS/BLUE SHIELD

## 2017-06-11 ENCOUNTER — Encounter: Payer: Self-pay | Admitting: Adult Health

## 2017-06-11 VITALS — BP 126/75 | HR 74 | Temp 98.3°F | Resp 18

## 2017-06-11 DIAGNOSIS — Z5111 Encounter for antineoplastic chemotherapy: Secondary | ICD-10-CM | POA: Diagnosis not present

## 2017-06-11 DIAGNOSIS — C50411 Malignant neoplasm of upper-outer quadrant of right female breast: Secondary | ICD-10-CM

## 2017-06-11 DIAGNOSIS — Z95828 Presence of other vascular implants and grafts: Secondary | ICD-10-CM

## 2017-06-11 DIAGNOSIS — Z170 Estrogen receptor positive status [ER+]: Secondary | ICD-10-CM

## 2017-06-11 DIAGNOSIS — Z1509 Genetic susceptibility to other malignant neoplasm: Secondary | ICD-10-CM | POA: Diagnosis not present

## 2017-06-11 LAB — CBC WITH DIFFERENTIAL/PLATELET
BASO%: 0.7 % (ref 0.0–2.0)
BASOS ABS: 0.1 10*3/uL (ref 0.0–0.1)
EOS ABS: 0 10*3/uL (ref 0.0–0.5)
EOS%: 0.2 % (ref 0.0–7.0)
HCT: 31.6 % — ABNORMAL LOW (ref 34.8–46.6)
HGB: 10.7 g/dL — ABNORMAL LOW (ref 11.6–15.9)
LYMPH%: 8.3 % — AB (ref 14.0–49.7)
MCH: 31.4 pg (ref 25.1–34.0)
MCHC: 34 g/dL (ref 31.5–36.0)
MCV: 92.2 fL (ref 79.5–101.0)
MONO#: 0.9 10*3/uL (ref 0.1–0.9)
MONO%: 6.9 % (ref 0.0–14.0)
NEUT%: 83.9 % — ABNORMAL HIGH (ref 38.4–76.8)
NEUTROS ABS: 11.2 10*3/uL — AB (ref 1.5–6.5)
PLATELETS: 167 10*3/uL (ref 145–400)
RBC: 3.42 10*6/uL — AB (ref 3.70–5.45)
RDW: 16.8 % — ABNORMAL HIGH (ref 11.2–14.5)
WBC: 13.3 10*3/uL — AB (ref 3.9–10.3)
lymph#: 1.1 10*3/uL (ref 0.9–3.3)

## 2017-06-11 LAB — COMPREHENSIVE METABOLIC PANEL
ALBUMIN: 4 g/dL (ref 3.5–5.0)
ALK PHOS: 82 U/L (ref 40–150)
ALT: 20 U/L (ref 0–55)
ANION GAP: 8 meq/L (ref 3–11)
AST: 18 U/L (ref 5–34)
BILIRUBIN TOTAL: 0.38 mg/dL (ref 0.20–1.20)
BUN: 15.5 mg/dL (ref 7.0–26.0)
CO2: 25 meq/L (ref 22–29)
Calcium: 8.9 mg/dL (ref 8.4–10.4)
Chloride: 107 mEq/L (ref 98–109)
Creatinine: 0.7 mg/dL (ref 0.6–1.1)
Glucose: 105 mg/dl (ref 70–140)
POTASSIUM: 3.9 meq/L (ref 3.5–5.1)
Sodium: 140 mEq/L (ref 136–145)
TOTAL PROTEIN: 7 g/dL (ref 6.4–8.3)

## 2017-06-11 MED ORDER — SODIUM CHLORIDE 0.9% FLUSH
10.0000 mL | Freq: Once | INTRAVENOUS | Status: AC
  Administered 2017-06-11: 10 mL
  Filled 2017-06-11: qty 10

## 2017-06-11 MED ORDER — DIPHENHYDRAMINE HCL 50 MG/ML IJ SOLN
INTRAMUSCULAR | Status: AC
  Filled 2017-06-11: qty 1

## 2017-06-11 MED ORDER — HEPARIN SOD (PORK) LOCK FLUSH 100 UNIT/ML IV SOLN
500.0000 [IU] | Freq: Once | INTRAVENOUS | Status: DC | PRN
  Filled 2017-06-11: qty 5

## 2017-06-11 MED ORDER — HEPARIN SOD (PORK) LOCK FLUSH 100 UNIT/ML IV SOLN
500.0000 [IU] | Freq: Once | INTRAVENOUS | Status: AC
  Administered 2017-06-11: 500 [IU]
  Filled 2017-06-11: qty 5

## 2017-06-11 MED ORDER — SODIUM CHLORIDE 0.9 % IV SOLN
80.0000 mg/m2 | Freq: Once | INTRAVENOUS | Status: AC
  Administered 2017-06-11: 150 mg via INTRAVENOUS
  Filled 2017-06-11: qty 25

## 2017-06-11 MED ORDER — SODIUM CHLORIDE 0.9 % IV SOLN
Freq: Once | INTRAVENOUS | Status: AC
  Administered 2017-06-11: 15:00:00 via INTRAVENOUS

## 2017-06-11 MED ORDER — SODIUM CHLORIDE 0.9% FLUSH
10.0000 mL | INTRAVENOUS | Status: DC | PRN
  Administered 2017-06-11: 10 mL
  Filled 2017-06-11: qty 10

## 2017-06-11 MED ORDER — FAMOTIDINE IN NACL 20-0.9 MG/50ML-% IV SOLN
20.0000 mg | Freq: Once | INTRAVENOUS | Status: AC
  Administered 2017-06-11: 20 mg via INTRAVENOUS

## 2017-06-11 MED ORDER — DIPHENHYDRAMINE HCL 50 MG/ML IJ SOLN
50.0000 mg | Freq: Once | INTRAMUSCULAR | Status: AC
  Administered 2017-06-11: 50 mg via INTRAVENOUS

## 2017-06-11 MED ORDER — FAMOTIDINE IN NACL 20-0.9 MG/50ML-% IV SOLN
INTRAVENOUS | Status: AC
  Filled 2017-06-11: qty 50

## 2017-06-11 NOTE — Progress Notes (Signed)
Patient tolerated tx well. Denies concerns or complaints. Written education provided and verbally reviewed. Patient verbalized understanding. [name] Mince, BSN, RN

## 2017-06-11 NOTE — Patient Instructions (Signed)
Paclitaxel injection What is this medicine? PACLITAXEL (PAK li TAX el) is a chemotherapy drug. It targets fast dividing cells, like cancer cells, and causes these cells to die. This medicine is used to treat ovarian cancer, breast cancer, and other cancers. This medicine may be used for other purposes; ask your health care provider or pharmacist if you have questions. COMMON BRAND NAME(S): Onxol, Taxol What should I tell my health care provider before I take this medicine? They need to know if you have any of these conditions: -blood disorders -irregular heartbeat -infection (especially a virus infection such as chickenpox, cold sores, or herpes) -liver disease -previous or ongoing radiation therapy -an unusual or allergic reaction to paclitaxel, alcohol, polyoxyethylated castor oil, other chemotherapy agents, other medicines, foods, dyes, or preservatives -pregnant or trying to get pregnant -breast-feeding How should I use this medicine? This drug is given as an infusion into a vein. It is administered in a hospital or clinic by a specially trained health care professional. Talk to your pediatrician regarding the use of this medicine in children. Special care may be needed. Overdosage: If you think you have taken too much of this medicine contact a poison control center or emergency room at once. NOTE: This medicine is only for you. Do not share this medicine with others. What if I miss a dose? It is important not to miss your dose. Call your doctor or health care professional if you are unable to keep an appointment. What may interact with this medicine? Do not take this medicine with any of the following medications: -disulfiram -metronidazole This medicine may also interact with the following medications: -cyclosporine -diazepam -ketoconazole -medicines to increase blood counts like filgrastim, pegfilgrastim, sargramostim -other chemotherapy drugs like cisplatin, doxorubicin,  epirubicin, etoposide, teniposide, vincristine -quinidine -testosterone -vaccines -verapamil Talk to your doctor or health care professional before taking any of these medicines: -acetaminophen -aspirin -ibuprofen -ketoprofen -naproxen This list may not describe all possible interactions. Give your health care provider a list of all the medicines, herbs, non-prescription drugs, or dietary supplements you use. Also tell them if you smoke, drink alcohol, or use illegal drugs. Some items may interact with your medicine. What should I watch for while using this medicine? Your condition will be monitored carefully while you are receiving this medicine. You will need important blood work done while you are taking this medicine. This medicine can cause serious allergic reactions. To reduce your risk you will need to take other medicine(s) before treatment with this medicine. If you experience allergic reactions like skin rash, itching or hives, swelling of the face, lips, or tongue, tell your doctor or health care professional right away. In some cases, you may be given additional medicines to help with side effects. Follow all directions for their use. This drug may make you feel generally unwell. This is not uncommon, as chemotherapy can affect healthy cells as well as cancer cells. Report any side effects. Continue your course of treatment even though you feel ill unless your doctor tells you to stop. Call your doctor or health care professional for advice if you get a fever, chills or sore throat, or other symptoms of a cold or flu. Do not treat yourself. This drug decreases your body's ability to fight infections. Try to avoid being around people who are sick. This medicine may increase your risk to bruise or bleed. Call your doctor or health care professional if you notice any unusual bleeding. Be careful brushing and flossing your teeth or   using a toothpick because you may get an infection or  bleed more easily. If you have any dental work done, tell your dentist you are receiving this medicine. Avoid taking products that contain aspirin, acetaminophen, ibuprofen, naproxen, or ketoprofen unless instructed by your doctor. These medicines may hide a fever. Do not become pregnant while taking this medicine. Women should inform their doctor if they wish to become pregnant or think they might be pregnant. There is a potential for serious side effects to an unborn child. Talk to your health care professional or pharmacist for more information. Do not breast-feed an infant while taking this medicine. Men are advised not to father a child while receiving this medicine. This product may contain alcohol. Ask your pharmacist or healthcare provider if this medicine contains alcohol. Be sure to tell all healthcare providers you are taking this medicine. Certain medicines, like metronidazole and disulfiram, can cause an unpleasant reaction when taken with alcohol. The reaction includes flushing, headache, nausea, vomiting, sweating, and increased thirst. The reaction can last from 30 minutes to several hours. What side effects may I notice from receiving this medicine? Side effects that you should report to your doctor or health care professional as soon as possible: -allergic reactions like skin rash, itching or hives, swelling of the face, lips, or tongue -low blood counts - This drug may decrease the number of white blood cells, red blood cells and platelets. You may be at increased risk for infections and bleeding. -signs of infection - fever or chills, cough, sore throat, pain or difficulty passing urine -signs of decreased platelets or bleeding - bruising, pinpoint red spots on the skin, black, tarry stools, nosebleeds -signs of decreased red blood cells - unusually weak or tired, fainting spells, lightheadedness -breathing problems -chest pain -high or low blood pressure -mouth sores -nausea and  vomiting -pain, swelling, redness or irritation at the injection site -pain, tingling, numbness in the hands or feet -slow or irregular heartbeat -swelling of the ankle, feet, hands Side effects that usually do not require medical attention (report to your doctor or health care professional if they continue or are bothersome): -bone pain -complete hair loss including hair on your head, underarms, pubic hair, eyebrows, and eyelashes -changes in the color of fingernails -diarrhea -loosening of the fingernails -loss of appetite -muscle or joint pain -red flush to skin -sweating This list may not describe all possible side effects. Call your doctor for medical advice about side effects. You may report side effects to FDA at 1-800-FDA-1088. Where should I keep my medicine? This drug is given in a hospital or clinic and will not be stored at home. NOTE: This sheet is a summary. It may not cover all possible information. If you have questions about this medicine, talk to your doctor, pharmacist, or health care provider.  2018 Elsevier/Gold Standard (2015-06-13 19:58:00)   [facility] Discharge Instructions for Patients Receiving Chemotherapy  Today you received the following chemotherapy agents Taxol  To help prevent nausea and vomiting after your treatment, we encourage you to take your nausea medication  As directed   If you develop nausea and vomiting that is not controlled by your nausea medication, call the clinic.   BELOW ARE SYMPTOMS THAT SHOULD BE REPORTED IMMEDIATELY:  *FEVER GREATER THAN 100.5 F  *CHILLS WITH OR WITHOUT FEVER  NAUSEA AND VOMITING THAT IS NOT CONTROLLED WITH YOUR NAUSEA MEDICATION  *UNUSUAL SHORTNESS OF BREATH  *UNUSUAL BRUISING OR BLEEDING  TENDERNESS IN MOUTH AND  THROAT WITH OR WITHOUT PRESENCE OF ULCERS  *URINARY PROBLEMS  *BOWEL PROBLEMS  UNUSUAL RASH Items with * indicate a potential emergency and should be followed up as soon as  possible.  Feel free to call the clinic should you have any questions or concerns. The clinic phone number is [phone].  Please show the [place] at check-in to the Emergency Department and triage nurse.

## 2017-06-11 NOTE — Assessment & Plan Note (Signed)
03/19/2017: Right lumpectomy: IDC with DCIS, 1.2 cm, grade 2, margins negative, 0/2 lymph nodes negative, ER 100%, PR 10%, HER-2 negative ratio 1.73, Ki-67 40% T1BN0 stage IA CHEK 2 Mutation Positive Oncotype Dx score: 41 (28% ROR with Tamoxifen alone) Treatment plan: 1. Adjuvant chemotherapy with dose dense Adriamycin Cytoxan 4 followed by weekly Taxol 12 2. followed by adjuvant radiation 3. Followed by adjuvant antiestrogen therapy ---------------------------------------------------------------------------- Current treatment: Cycle 1 of weekly Taxol  Rebecca [name] is doing well today.  She and I reviewed possible side effects of chemotherapy, particularly peripheral neuropathy.  She will proceed with treatment today.  She will return in 1 week for labs, f/u with Dr. Lindi Adie, and her second week of Taxol.  Return to clinic in 1 week.

## 2017-06-11 NOTE — Patient Instructions (Signed)
Implanted Port Home Guide An implanted port is a type of central line that is placed under the skin. Central lines are used to provide IV access when treatment or nutrition needs to be given through a person's veins. Implanted ports are used for long-term IV access. An implanted port may be placed because:  You need IV medicine that would be irritating to the small veins in your hands or arms.  You need long-term IV medicines, such as antibiotics.  You need IV nutrition for a long period.  You need frequent blood draws for lab tests.  You need dialysis.  Implanted ports are usually placed in the chest area, but they can also be placed in the upper arm, the abdomen, or the leg. An implanted port has two main parts:  Reservoir. The reservoir is round and will appear as a small, raised area under your skin. The reservoir is the part where a needle is inserted to give medicines or draw blood.  Catheter. The catheter is a thin, flexible tube that extends from the reservoir. The catheter is placed into a large vein. Medicine that is inserted into the reservoir goes into the catheter and then into the vein.  How will I care for my incision site? Do not get the incision site wet. Bathe or shower as directed by your health care provider. How is my port accessed? Special steps must be taken to access the port:  Before the port is accessed, a numbing cream can be placed on the skin. This helps numb the skin over the port site.  Your health care provider uses a sterile technique to access the port. ? Your health care provider must put on a mask and sterile gloves. ? The skin over your port is cleaned carefully with an antiseptic and allowed to dry. ? The port is gently pinched between sterile gloves, and a needle is inserted into the port.  Only "non-coring" port needles should be used to access the port. Once the port is accessed, a blood return should be checked. This helps ensure that the port  is in the vein and is not clogged.  If your port needs to remain accessed for a constant infusion, a clear (transparent) bandage will be placed over the needle site. The bandage and needle will need to be changed every week, or as directed by your health care provider.  Keep the bandage covering the needle clean and dry. Do not get it wet. Follow your health care provider's instructions on how to take a shower or bath while the port is accessed.  If your port does not need to stay accessed, no bandage is needed over the port.  What is flushing? Flushing helps keep the port from getting clogged. Follow your health care provider's instructions on how and when to flush the port. Ports are usually flushed with saline solution or a medicine called heparin. The need for flushing will depend on how the port is used.  If the port is used for intermittent medicines or blood draws, the port will need to be flushed: ? After medicines have been given. ? After blood has been drawn. ? As part of routine maintenance.  If a constant infusion is running, the port may not need to be flushed.  How long will my port stay implanted? The port can stay in for as long as your health care provider thinks it is needed. When it is time for the port to come out, surgery will be   done to remove it. The procedure is similar to the one performed when the port was put in. When should I seek immediate medical care? When you have an implanted port, you should seek immediate medical care if:  You notice a bad smell coming from the incision site.  You have swelling, redness, or drainage at the incision site.  You have more swelling or pain at the port site or the surrounding area.  You have a fever that is not controlled with medicine.  This information is not intended to replace advice given to you by your health care provider. Make sure you discuss any questions you have with your health care provider. Document  Released: 08/12/2005 Document Revised: 01/18/2016 Document Reviewed: 04/19/2013 Elsevier Interactive Patient Education  2017 [employer] Inc.  

## 2017-06-11 NOTE — Progress Notes (Signed)
Per Dr. Lindi Adie, ok to proceed with treatment today using CMP from 05/28/2017.

## 2017-06-11 NOTE — Progress Notes (Signed)
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  The STAT Breast cancer panel offered by [facility] includes sequencing and rearrangement analysis for the following 9 genes:  ATM, BRCA1, BRCA2, CDH1, CHEK2, PALB2, PTEN, STK11 and TP53.   The report date is March 09, 2017.  Re-requitisioned to the common hereditary cancer panel and the melanoma panel.  The additional genes tested were negative.  The Hereditary Gene Panel offered by [facility] includes sequencing and/or deletion duplication testing of the following 46 genes: APC, ATM, AXIN2, BARD1, BMPR1A, BRCA1, BRCA2, BRIP1, CDH1, CDKN2A (p14ARF), CDKN2A (p16INK4a), CHEK2, CTNNA1, DICER1, EPCAM (Deletion/duplication testing only), GREM1 (promoter region deletion/duplication testing only), KIT, MEN1, MLH1, MSH2, MSH3, MSH6, MUTYH, NBN, NF1, NHTL1, PALB2, PDGFRA, PMS2, POLD1, POLE, PTEN, RAD50, RAD51C, RAD51D, SDHB,  SDHC, SDHD, SMAD4, SMARCA4. STK11, TP53, TSC1, TSC2, and VHL.  The following genes were evaluated for sequence changes only: SDHA and HOXB13 c.251G>A variant only.  The Melanoma panel offered by [facility] includes sequencing and/or deletion duplication testing of the following 12 genes: BAP1, BRCA1, BRCA2, BRIP1, CDK4, CDKN2A (p14ARF), CDKN2A (p16INK4a), MC1R, POT1, PTEN, RB1, TERT, and TP53.  The following gene was evaluated for sequence changes only: MITF (c.952G>A, p.GLU318Lys variant only). The updated report date is March 14, 2017.        03/19/2017 Surgery    Right lumpectomy: IDC with DCIS, 1.2 cm, grade 2, margins negative, 0/2 lymph nodes negative, ER 100%, PR 10%, HER-2 negative ratio 1.73, Ki-67 40% T1BN0 stage IA      04/01/2017 Oncotype testing    Oncotype Dx score: 41 (28% ROR with Tamoxifen alone)      04/16/2017 -  Chemotherapy    Dose dense Adriamycin and Cytoxan 4 followed by Taxol weekly 12        CURRENT THERAPY: Weekly Taxol  INTERVAL HISTORY: Rebecca [name] 61 y.o. female returns for evlaution prior to receiving Taxol chemotherapy.     Patient Active Problem List   Diagnosis Date Noted  . Port catheter in place 04/23/2017  . Genetic testing 03/10/2017  . Carcinoma of upper-outer quadrant of right breast in female, estrogen receptor positive ([place]) 03/05/2017  . 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 Family history of breast cancer   . Family history of melanoma   . Family history of colon cancer   . Family history of brain cancer   . Routine general medical examination at a health care  facility 03/20/2016  . Migraine 08/24/2015  . Hyperlipidemia 07/02/2013  . Vitamin D deficiency 07/02/2013    is allergic to citalopram; citalopram; dexamethasone; dexamethasone; dilaudid [hydromorphone hcl]; propoxycaine; red yeast rice [cholestin]; rofecoxib; and sulfamethoxazole.  MEDICAL HISTORY: Past Medical History:  Diagnosis Date  . Depression    REMISSION/  SITUATIONAL  . Dyspnea   . Family history of brain cancer   . Family history of breast cancer   . Family history of colon cancer   . Family history of melanoma   . Hepatitis AGE 11   RESOLVED  . Hyperlipidemia   . Insomnia   . Migraines    HORMONAL  . Shingles outbreak 2003, 2015   RIGHT HIP, and between breasts 3 years ago    SURGICAL HISTORY: Past Surgical History:  Procedure Laterality Date  . BREAST LUMPECTOMY WITH RADIOACTIVE SEED AND SENTINEL LYMPH NODE BIOPSY Right 03/19/2017   Procedure: RIGHT BREAST LUMPECTOMY WITH RADIOACTIVE SEED AND SENTINEL LYMPH NODE BIOPSY;  Surgeon: Excell Seltzer, MD;  Location: [facility];  Service: General;  Laterality: Right;  . KNEE ARTHROSCOPY Right 2002  . TUBAL LIGATION    . uterine polyps  2016    SOCIAL HISTORY: Social History   Social History  . Marital status: Married    Spouse name: N/A  . Number of children: N/A  . Years of education: N/A   Occupational History  . Not on file.   Social History Main Topics  . Smoking status: Former Smoker    Quit date: 08/26/2008  . Smokeless tobacco: Never Used  . Alcohol use No     Comment: OCCASIONAL  . Drug use: No  . Sexual activity: Yes    Birth control/ protection: None   Other Topics Concern  . Not on file   Social History Narrative   ** Merged History Encounter **        FAMILY HISTORY: Family History  Problem Relation Age of Onset  . Depression Brother   . Lung cancer Brother 63       maternal half brother  . Dementia Mother        Delman Kitten  . Cancer Father 91       MELANOMA CAUSED DEATH/melanoma  . Cancer Brother        multiple myeloma/lung - maternal half brother  . Stroke Maternal Grandmother 80  . Cancer Sister 84       breast- deceased at 16 - 1/2 sister  . Breast cancer Sister   . Heart disease Maternal Grandfather   . Cancer Paternal Grandfather        COLON  . Cervical cancer Maternal Aunt   . Lung cancer Maternal Uncle   .  Other Paternal Grandmother        died in childbirth  . Dementia Maternal Aunt     Review of Systems  Constitutional: Positive for fatigue. Negative for appetite change, chills, fever and unexpected weight change.  HENT:   Negative for hearing loss and lump/mass.   Eyes: Negative for eye problems and icterus.  Respiratory: Negative for chest tightness, cough, shortness of breath and wheezing.   Cardiovascular: Negative for chest pain, leg swelling and palpitations.  Gastrointestinal: Negative for abdominal distention, abdominal pain, constipation, diarrhea, nausea and vomiting.  Endocrine: Negative for hot flashes.  Genitourinary: Negative for difficulty urinating.   Skin: Negative for itching and rash.  Neurological: Negative for dizziness, extremity weakness, headaches and numbness.  Hematological: Negative for adenopathy. Does not  bruise/bleed easily.  Psychiatric/Behavioral: Negative for depression. The patient is not nervous/anxious.       PHYSICAL EXAMINATION  ECOG PERFORMANCE STATUS: 1 - Symptomatic but completely ambulatory  Vitals:   06/11/17 1414  BP: (!) 123/53  Pulse: 80  Resp: 17  Temp: 98 F (36.7 C)  SpO2: 98%    Physical Exam  Constitutional: She is oriented to person, place, and time and well-developed, well-nourished, and in no distress.  HENT:  Head: Normocephalic and atraumatic.  Mouth/Throat: Oropharynx is clear and moist. No oropharyngeal exudate.  Eyes: Pupils are equal, round, and reactive to light. No scleral icterus.  Neck: Neck supple. No thyromegaly present.  Cardiovascular: Normal rate, regular rhythm and normal heart sounds.   Pulmonary/Chest: Effort normal and breath sounds normal. No respiratory distress. She has no wheezes. She has no rales.  Abdominal: Soft. Bowel sounds are normal. She exhibits no distension and no mass. There is no tenderness. There is no rebound and no guarding.  Musculoskeletal: She exhibits no edema.   Lymphadenopathy:    She has no cervical adenopathy.  Neurological: She is alert and oriented to person, place, and time.  Skin: Skin is warm and dry.  Psychiatric: Mood and affect normal.    LABORATORY DATA:  CBC    Component Value Date/Time   WBC 13.3 (H) 06/11/2017 1351   WBC 4.9 04/07/2017 0931   RBC 3.42 (L) 06/11/2017 1351   RBC 4.50 04/07/2017 0931   HGB 10.7 (L) 06/11/2017 1351   HCT 31.6 (L) 06/11/2017 1351   PLT 167 06/11/2017 1351   MCV 92.2 06/11/2017 1351   MCH 31.4 06/11/2017 1351   MCH 30.4 04/07/2017 0931   MCHC 34.0 06/11/2017 1351   MCHC 33.7 04/07/2017 0931   RDW 16.8 (H) 06/11/2017 1351   LYMPHSABS 1.1 06/11/2017 1351   MONOABS 0.9 06/11/2017 1351   EOSABS 0.0 06/11/2017 1351   BASOSABS 0.1 06/11/2017 1351    CMP     Component Value Date/Time   NA 140 05/28/2017 1038   K 4.0 05/28/2017 1038   CL 105 04/07/2017 0931   CO2 25 05/28/2017 1038   GLUCOSE 87 05/28/2017 1038   BUN 12.5 05/28/2017 1038   CREATININE 0.6 05/28/2017 1038   CALCIUM 9.1 05/28/2017 1038   PROT 6.9 05/28/2017 1038   ALBUMIN 4.1 05/28/2017 1038   AST 22 05/28/2017 1038   ALT 24 05/28/2017 1038   ALKPHOS 81 05/28/2017 1038   BILITOT 0.46 05/28/2017 1038   GFRNONAA >89 04/07/2017 0931   GFRAA >89 04/07/2017 0931         ASSESSMENT and PLAN:   Carcinoma of upper-outer quadrant of right breast in female, estrogen receptor positive ([place]) 03/19/2017: Right lumpectomy: IDC with DCIS, 1.2 cm, grade 2, margins negative, 0/2 lymph nodes negative, ER 100%, PR 10%, HER-2 negative ratio 1.73, Ki-67 40% T1BN0 stage IA CHEK 2 Mutation Positive Oncotype Dx score: 41 (28% ROR with Tamoxifen alone) Treatment plan: 1. Adjuvant chemotherapy with dose dense Adriamycin Cytoxan 4 followed by weekly Taxol 12 2. followed by adjuvant radiation 3. Followed by adjuvant antiestrogen therapy ---------------------------------------------------------------------------- Current treatment:  Cycle 1 of weekly Taxol  Analaya is doing well today.  She and I reviewed possible side effects of chemotherapy, particularly peripheral neuropathy.  She will proceed with treatment today.  She will return in 1 week for labs, f/u with Dr. Lindi Adie, and her second week of Taxol.  Return to clinic in 1 week.  All questions were answered. The  patient knows to call the clinic with any problems, questions or concerns. We can certainly see the patient much sooner if necessary.  A total of (30) minutes of face-to-face time was spent with this patient with greater than 50% of that time in counseling and care-coordination.  This note was electronically signed. Scot Dock, NP 06/11/2017

## 2017-06-16 ENCOUNTER — Telehealth: Payer: Self-pay

## 2017-06-16 NOTE — Telephone Encounter (Signed)
Pt started chemo last Wednesday. Since Friday her ovaries have been hurting. Not really bad but constant. She does have fibroids on each ovary. A dull, like little contractions in ovaries, not radiating. She has had similar in past when it is time for her period, even though she is in menapause.

## 2017-06-16 NOTE — Telephone Encounter (Signed)
Called pt to discuss further about her symptoms. Pt states that the pain is tolerable. Discussed with Dr.Gudena and feels that she needs to monitor and call her gyne if symptoms worsens. Told pt to watch for any vaginal bleeding or signs/symptoms of UTI. Told pt that she may take otc analgesics (tylenol) as needed today and see how she feels. Also to try warm packs on the affected area. Pt verbalized understanding and knows when to call for assistance or go to ED.

## 2017-06-18 ENCOUNTER — Other Ambulatory Visit: Payer: BLUE CROSS/BLUE SHIELD

## 2017-06-18 ENCOUNTER — Encounter: Payer: Self-pay | Admitting: *Deleted

## 2017-06-18 ENCOUNTER — Other Ambulatory Visit (HOSPITAL_BASED_OUTPATIENT_CLINIC_OR_DEPARTMENT_OTHER): Payer: BLUE CROSS/BLUE SHIELD

## 2017-06-18 ENCOUNTER — Ambulatory Visit (HOSPITAL_BASED_OUTPATIENT_CLINIC_OR_DEPARTMENT_OTHER): Payer: BLUE CROSS/BLUE SHIELD | Admitting: Hematology and Oncology

## 2017-06-18 ENCOUNTER — Ambulatory Visit: Payer: BLUE CROSS/BLUE SHIELD

## 2017-06-18 ENCOUNTER — Ambulatory Visit (HOSPITAL_BASED_OUTPATIENT_CLINIC_OR_DEPARTMENT_OTHER): Payer: BLUE CROSS/BLUE SHIELD

## 2017-06-18 VITALS — BP 117/62 | HR 74 | Temp 98.5°F | Resp 18 | Ht 66.0 in | Wt 170.0 lb

## 2017-06-18 DIAGNOSIS — C50411 Malignant neoplasm of upper-outer quadrant of right female breast: Secondary | ICD-10-CM | POA: Diagnosis not present

## 2017-06-18 DIAGNOSIS — Z170 Estrogen receptor positive status [ER+]: Secondary | ICD-10-CM

## 2017-06-18 DIAGNOSIS — Z5111 Encounter for antineoplastic chemotherapy: Secondary | ICD-10-CM

## 2017-06-18 DIAGNOSIS — Z95828 Presence of other vascular implants and grafts: Secondary | ICD-10-CM

## 2017-06-18 LAB — COMPREHENSIVE METABOLIC PANEL
ALT: 49 U/L (ref 0–55)
ANION GAP: 7 meq/L (ref 3–11)
AST: 30 U/L (ref 5–34)
Albumin: 3.7 g/dL (ref 3.5–5.0)
Alkaline Phosphatase: 57 U/L (ref 40–150)
BUN: 13.9 mg/dL (ref 7.0–26.0)
CHLORIDE: 107 meq/L (ref 98–109)
CO2: 24 meq/L (ref 22–29)
CREATININE: 0.6 mg/dL (ref 0.6–1.1)
Calcium: 8.8 mg/dL (ref 8.4–10.4)
EGFR: >60 mL/min/{1.73_m2} (ref >60)
GLUCOSE: 92 mg/dL (ref 70–140)
Potassium: 4 mEq/L (ref 3.5–5.1)
SODIUM: 139 meq/L (ref 136–145)
Total Bilirubin: 0.53 mg/dL (ref 0.20–1.20)
Total Protein: 6.3 g/dL — ABNORMAL LOW (ref 6.4–8.3)

## 2017-06-18 LAB — CBC WITH DIFFERENTIAL/PLATELET
BASO%: 0.8 % (ref 0.0–2.0)
Basophils Absolute: 0 10*3/uL (ref 0.0–0.1)
EOS%: 0.4 % (ref 0.0–7.0)
Eosinophils Absolute: 0 10*3/uL (ref 0.0–0.5)
HCT: 29.3 % — ABNORMAL LOW (ref 34.8–46.6)
HGB: 10 g/dL — ABNORMAL LOW (ref 11.6–15.9)
LYMPH%: 14.8 % (ref 14.0–49.7)
MCH: 31.7 pg (ref 25.1–34.0)
MCHC: 34.2 g/dL (ref 31.5–36.0)
MCV: 92.6 fL (ref 79.5–101.0)
MONO#: 0.4 10*3/uL (ref 0.1–0.9)
MONO%: 8.9 % (ref 0.0–14.0)
NEUT#: 3.5 10*3/uL (ref 1.5–6.5)
NEUT%: 75.1 % (ref 38.4–76.8)
PLATELETS: 243 10*3/uL (ref 145–400)
RBC: 3.16 10*6/uL — AB (ref 3.70–5.45)
RDW: 17 % — ABNORMAL HIGH (ref 11.2–14.5)
WBC: 4.6 10*3/uL (ref 3.9–10.3)
lymph#: 0.7 10*3/uL — ABNORMAL LOW (ref 0.9–3.3)

## 2017-06-18 MED ORDER — DIPHENHYDRAMINE HCL 50 MG/ML IJ SOLN
INTRAMUSCULAR | Status: AC
  Filled 2017-06-18: qty 1

## 2017-06-18 MED ORDER — SODIUM CHLORIDE 0.9 % IV SOLN
80.0000 mg/m2 | Freq: Once | INTRAVENOUS | Status: AC
  Administered 2017-06-18: 150 mg via INTRAVENOUS
  Filled 2017-06-18: qty 25

## 2017-06-18 MED ORDER — SODIUM CHLORIDE 0.9 % IV SOLN
Freq: Once | INTRAVENOUS | Status: AC
  Administered 2017-06-18: 13:00:00 via INTRAVENOUS

## 2017-06-18 MED ORDER — FAMOTIDINE IN NACL 20-0.9 MG/50ML-% IV SOLN
20.0000 mg | Freq: Once | INTRAVENOUS | Status: AC
  Administered 2017-06-18: 20 mg via INTRAVENOUS

## 2017-06-18 MED ORDER — FAMOTIDINE IN NACL 20-0.9 MG/50ML-% IV SOLN
INTRAVENOUS | Status: AC
  Filled 2017-06-18: qty 50

## 2017-06-18 MED ORDER — SODIUM CHLORIDE 0.9% FLUSH
10.0000 mL | Freq: Once | INTRAVENOUS | Status: AC
  Administered 2017-06-18: 10 mL
  Filled 2017-06-18: qty 10

## 2017-06-18 MED ORDER — HEPARIN SOD (PORK) LOCK FLUSH 100 UNIT/ML IV SOLN
500.0000 [IU] | Freq: Once | INTRAVENOUS | Status: AC | PRN
  Administered 2017-06-18: 500 [IU]
  Filled 2017-06-18: qty 5

## 2017-06-18 MED ORDER — SODIUM CHLORIDE 0.9% FLUSH
10.0000 mL | INTRAVENOUS | Status: DC | PRN
  Administered 2017-06-18: 10 mL
  Filled 2017-06-18: qty 10

## 2017-06-18 MED ORDER — DIPHENHYDRAMINE HCL 50 MG/ML IJ SOLN
25.0000 mg | Freq: Once | INTRAMUSCULAR | Status: AC
  Administered 2017-06-18: 25 mg via INTRAVENOUS

## 2017-06-18 NOTE — Assessment & Plan Note (Signed)
03/19/2017: Right lumpectomy: IDC with DCIS, 1.2 cm, grade 2, margins negative, 0/2 lymph nodes negative, ER 100%, PR 10%, HER-2 negative ratio 1.73, Ki-67 40% T1BN0 stage IA CHEK 2 Mutation Positive Oncotype Dx score: 41 (28% ROR with Tamoxifen alone) Treatment plan: 1. Adjuvant chemotherapy with dose dense Adriamycin Cytoxan 4 followed by weekly Taxol 12 2. followed by adjuvant radiation 3. Followed by adjuvant antiestrogen therapy ---------------------------------------------------------------------------- Current treatment: Cycle 2 of weekly Taxol Toxicities: 1.  Darkening of the nailbeds I would reduce the dosage of Benadryl because she was very dizzy after 50 mg of Benadryl.  Return to clinic weekly for chemo and every 2 weeks for follow-up with either myself or Mendel Ryder.

## 2017-06-18 NOTE — Progress Notes (Signed)
Patient Care Team: [name], MD as PCP - General (Internal Medicine) [name], MD (Internal Medicine) Arta Silence, MD as Consulting Physician (Gastroenterology) Arvella Nigh, MD as Consulting Physician (Obstetrics and Gynecology)  DIAGNOSIS:  Encounter Diagnosis  Name Primary?  . 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  The STAT Breast cancer panel offered by [facility] includes sequencing and rearrangement analysis for the following 9 genes:  ATM, BRCA1, BRCA2, CDH1, CHEK2, PALB2, PTEN, STK11 and TP53.   The report date is March 09, 2017.  Re-requitisioned to the common hereditary cancer panel and the melanoma panel.  The additional genes tested were negative.  The Hereditary Gene Panel offered by [facility] includes sequencing and/or deletion duplication testing of the following 46 genes: APC, ATM, AXIN2, BARD1, BMPR1A, BRCA1, BRCA2, BRIP1, CDH1, CDKN2A (p14ARF), CDKN2A (p16INK4a), CHEK2, CTNNA1, DICER1, EPCAM (Deletion/duplication testing only), GREM1 (promoter region deletion/duplication testing only), KIT, MEN1, MLH1, MSH2, MSH3, MSH6, MUTYH, NBN, NF1, NHTL1, PALB2, PDGFRA, PMS2, POLD1, POLE, PTEN, RAD50, RAD51C, RAD51D, SDHB, SDHC, SDHD, SMAD4, SMARCA4. STK11, TP53, TSC1, TSC2, and VHL.  The following  genes were evaluated for sequence changes only: SDHA and HOXB13 c.251G>A variant only.  The Melanoma panel offered by [facility] includes sequencing and/or deletion duplication testing of the following 12 genes: BAP1, BRCA1, BRCA2, BRIP1, CDK4, CDKN2A (p14ARF), CDKN2A (p16INK4a), MC1R, POT1, PTEN, RB1, TERT, and TP53.  The following gene was evaluated for sequence changes only: MITF (c.952G>A, p.GLU318Lys variant only). The updated report date is March 14, 2017.        03/19/2017 Surgery    Right lumpectomy: IDC with DCIS, 1.2 cm, grade 2, margins negative, 0/2 lymph nodes negative, ER 100%, PR 10%, HER-2 negative ratio 1.73, Ki-67 40% T1BN0 stage IA      04/01/2017 Oncotype testing    Oncotype Dx score: 41 (28% ROR with Tamoxifen alone)      04/16/2017 -  Chemotherapy    Dose dense Adriamycin and Cytoxan 4 followed by Taxol weekly 12        CHIEF COMPLIANT: Cycle 2 Taxol  INTERVAL HISTORY: Rebecca [name] is a 61 year old with above-mentioned history of right breast cancer who is currently on adjuvant chemotherapy and today is cycle 2 of Taxol.  She tolerated cycle extremely well.  When she got Benadryl she felt very dizzy but otherwise she did not have any other side effects from the treatment.  Continues to have mild fatigue towards the end of the day.  Denies any mouth sores or fevers or chills.  Denies any neuropathy.  Darkening of the nailbeds is noted.  REVIEW OF SYSTEMS:   Constitutional: Denies fevers, chills or abnormal weight loss Eyes: Denies blurriness of vision Ears, nose, mouth, throat, and face: Denies mucositis or sore throat Respiratory: Denies cough, dyspnea  or wheezes Cardiovascular: Denies palpitation, chest discomfort Gastrointestinal:  Denies nausea, heartburn or change in bowel habits Skin: Denies abnormal skin rashes Lymphatics: Denies new lymphadenopathy or easy bruising Neurological:Denies numbness, tingling or new weaknesses Behavioral/Psych: Mood is stable, no  new changes  Extremities: No lower extremity edema Breast:  denies any pain or lumps or nodules in either breasts All other systems were reviewed with the patient and are negative.  I have reviewed the past medical history, past surgical history, social history and family history with the patient and they are unchanged from previous note.  ALLERGIES:  is allergic to citalopram; citalopram; dexamethasone; dexamethasone; dilaudid [hydromorphone hcl]; propoxycaine; red yeast rice [cholestin]; rofecoxib; and sulfamethoxazole.  MEDICATIONS:  Current Outpatient Prescriptions  Medication Sig Dispense Refill  . dexamethasone (DECADRON) 4 MG tablet Take 1 tablet (4 mg total) by mouth daily. Take 1 tablet with food daily for 3 days starting day after chemotherapy 12 tablet 0  . lidocaine-prilocaine (EMLA) cream Apply to affected area once 30 g 3  . LORazepam (ATIVAN) 0.5 MG tablet Take 1 tablet (0.5 mg total) by mouth at bedtime. As needed 30 tablet 0  . ondansetron (ZOFRAN) 8 MG tablet Take 1 tablet (8 mg total) by mouth 2 (two) times daily as needed. Start on the third day after chemotherapy. 30 tablet 1  . prochlorperazine (COMPAZINE) 10 MG tablet Take 1 tablet (10 mg total) by mouth every 6 (six) hours as needed (Nausea or vomiting). 30 tablet 1   No current facility-administered medications for this visit.     PHYSICAL EXAMINATION: ECOG PERFORMANCE STATUS: 1 - Symptomatic but completely ambulatory  Vitals:   06/18/17 1137  BP: 117/62  Pulse: 74  Resp: 18  Temp: 98.5 F (36.9 C)  SpO2: 100%   Filed Weights   06/18/17 1137  Weight: 170 lb (77.1 kg)    GENERAL:alert, no distress and comfortable SKIN: skin color, texture, turgor are normal, no rashes or significant lesions EYES: normal, Conjunctiva are pink and non-injected, sclera clear OROPHARYNX:no exudate, no erythema and lips, buccal mucosa, and tongue normal  NECK: supple, thyroid normal size, non-tender, without  nodularity LYMPH:  no palpable lymphadenopathy in the cervical, axillary or inguinal LUNGS: clear to auscultation and percussion with normal breathing effort HEART: regular rate & rhythm and no murmurs and no lower extremity edema ABDOMEN:abdomen soft, non-tender and normal bowel sounds MUSCULOSKELETAL:no cyanosis of digits and no clubbing  NEURO: alert & oriented x 3 with fluent speech, no focal motor/sensory deficits EXTREMITIES: No lower extremity edema   LABORATORY DATA:  I have reviewed the data as listed   Chemistry      Component Value Date/Time   NA 140 06/11/2017 1351   K 3.9 06/11/2017 1351   CL 105 04/07/2017 0931   CO2 25 06/11/2017 1351   BUN 15.5 06/11/2017 1351   CREATININE 0.7 06/11/2017 1351      Component Value Date/Time   CALCIUM 8.9 06/11/2017 1351   ALKPHOS 82 06/11/2017 1351   AST 18 06/11/2017 1351   ALT 20 06/11/2017 1351   BILITOT 0.38 06/11/2017 1351       Lab Results  Component Value Date   WBC 4.6 06/18/2017   HGB 10.0 (L) 06/18/2017   HCT 29.3 (L) 06/18/2017   MCV 92.6 06/18/2017   PLT 243 06/18/2017   NEUTROABS 3.5 06/18/2017    ASSESSMENT & PLAN:  Carcinoma of upper-outer quadrant of right breast in female, estrogen receptor positive ([place]) 03/19/2017: Right lumpectomy: IDC with  DCIS, 1.2 cm, grade 2, margins negative, 0/2 lymph nodes negative, ER 100%, PR 10%, HER-2 negative ratio 1.73, Ki-67 40% T1BN0 stage IA CHEK 2 Mutation Positive Oncotype Dx score: 41 (28% ROR with Tamoxifen alone) Treatment plan: 1. Adjuvant chemotherapy with dose dense Adriamycin Cytoxan 4 followed by weekly Taxol 12 2. followed by adjuvant radiation 3. Followed by adjuvant antiestrogen therapy ---------------------------------------------------------------------------- Current treatment: Cycle 2 of weekly Taxol Toxicities: 1.  Darkening of the nailbeds I would reduce the dosage of Benadryl because she was very dizzy after 50 mg of Benadryl.  Return to  clinic weekly for chemo and every 2 weeks for follow-up with either myself or Mendel Ryder.   I spent 25 minutes talking to the patient of which more than half was spent in counseling and coordination of care.  No orders of the defined types were placed in this encounter.  The patient has a good understanding of the overall plan. she agrees with it. she will call with any problems that may develop before the next visit here.   Rulon Eisenmenger, MD 06/18/17

## 2017-06-18 NOTE — Patient Instructions (Signed)
[facility] Discharge Instructions for Patients Receiving Chemotherapy  Today you received the following chemotherapy agents: TAxol  To help prevent nausea and vomiting after your treatment, we encourage you to take your nausea medication as prescribed.   If you develop nausea and vomiting that is not controlled by your nausea medication, call the clinic.   BELOW ARE SYMPTOMS THAT SHOULD BE REPORTED IMMEDIATELY:  *FEVER GREATER THAN 100.5 F  *CHILLS WITH OR WITHOUT FEVER  NAUSEA AND VOMITING THAT IS NOT CONTROLLED WITH YOUR NAUSEA MEDICATION  *UNUSUAL SHORTNESS OF BREATH  *UNUSUAL BRUISING OR BLEEDING  TENDERNESS IN MOUTH AND THROAT WITH OR WITHOUT PRESENCE OF ULCERS  *URINARY PROBLEMS  *BOWEL PROBLEMS  UNUSUAL RASH Items with * indicate a potential emergency and should be followed up as soon as possible.  Feel free to call the clinic should you have any questions or concerns. The clinic phone number is [phone].  Please show the [place] at check-in to the Emergency Department and triage nurse.

## 2017-06-25 ENCOUNTER — Ambulatory Visit: Payer: BLUE CROSS/BLUE SHIELD

## 2017-06-25 ENCOUNTER — Ambulatory Visit (HOSPITAL_BASED_OUTPATIENT_CLINIC_OR_DEPARTMENT_OTHER): Payer: BLUE CROSS/BLUE SHIELD

## 2017-06-25 ENCOUNTER — Other Ambulatory Visit (HOSPITAL_BASED_OUTPATIENT_CLINIC_OR_DEPARTMENT_OTHER): Payer: BLUE CROSS/BLUE SHIELD

## 2017-06-25 VITALS — BP 116/70 | HR 73 | Temp 98.1°F | Resp 17

## 2017-06-25 DIAGNOSIS — C50411 Malignant neoplasm of upper-outer quadrant of right female breast: Secondary | ICD-10-CM | POA: Diagnosis not present

## 2017-06-25 DIAGNOSIS — Z170 Estrogen receptor positive status [ER+]: Secondary | ICD-10-CM

## 2017-06-25 DIAGNOSIS — Z23 Encounter for immunization: Secondary | ICD-10-CM | POA: Diagnosis not present

## 2017-06-25 DIAGNOSIS — Z5111 Encounter for antineoplastic chemotherapy: Secondary | ICD-10-CM | POA: Diagnosis not present

## 2017-06-25 DIAGNOSIS — Z95828 Presence of other vascular implants and grafts: Secondary | ICD-10-CM

## 2017-06-25 LAB — CBC WITH DIFFERENTIAL/PLATELET
BASO%: 2.1 % — ABNORMAL HIGH (ref 0.0–2.0)
Basophils Absolute: 0.1 10*3/uL (ref 0.0–0.1)
EOS ABS: 0 10*3/uL (ref 0.0–0.5)
EOS%: 0.9 % (ref 0.0–7.0)
HCT: 30.2 % — ABNORMAL LOW (ref 34.8–46.6)
HEMOGLOBIN: 10.5 g/dL — AB (ref 11.6–15.9)
LYMPH%: 13.9 % — ABNORMAL LOW (ref 14.0–49.7)
MCH: 32 pg (ref 25.1–34.0)
MCHC: 34.7 g/dL (ref 31.5–36.0)
MCV: 92.1 fL (ref 79.5–101.0)
MONO#: 0.5 10*3/uL (ref 0.1–0.9)
MONO%: 10.4 % (ref 0.0–14.0)
NEUT%: 72.7 % (ref 38.4–76.8)
NEUTROS ABS: 3.4 10*3/uL (ref 1.5–6.5)
Platelets: 215 10*3/uL (ref 145–400)
RBC: 3.28 10*6/uL — AB (ref 3.70–5.45)
RDW: 17.2 % — AB (ref 11.2–14.5)
WBC: 4.7 10*3/uL (ref 3.9–10.3)
lymph#: 0.6 10*3/uL — ABNORMAL LOW (ref 0.9–3.3)

## 2017-06-25 LAB — COMPREHENSIVE METABOLIC PANEL
ALT: 76 U/L — ABNORMAL HIGH (ref 0–55)
ANION GAP: 8 meq/L (ref 3–11)
AST: 26 U/L (ref 5–34)
Albumin: 3.7 g/dL (ref 3.5–5.0)
Alkaline Phosphatase: 53 U/L (ref 40–150)
BILIRUBIN TOTAL: 0.67 mg/dL (ref 0.20–1.20)
BUN: 16.9 mg/dL (ref 7.0–26.0)
CHLORIDE: 105 meq/L (ref 98–109)
CO2: 25 meq/L (ref 22–29)
Calcium: 8.8 mg/dL (ref 8.4–10.4)
Creatinine: 0.7 mg/dL (ref 0.6–1.1)
Glucose: 102 mg/dl (ref 70–140)
Potassium: 3.9 mEq/L (ref 3.5–5.1)
Sodium: 139 mEq/L (ref 136–145)
Total Protein: 6.5 g/dL (ref 6.4–8.3)

## 2017-06-25 MED ORDER — DIPHENHYDRAMINE HCL 50 MG/ML IJ SOLN
INTRAMUSCULAR | Status: AC
  Filled 2017-06-25: qty 1

## 2017-06-25 MED ORDER — FAMOTIDINE IN NACL 20-0.9 MG/50ML-% IV SOLN
INTRAVENOUS | Status: AC
  Filled 2017-06-25: qty 50

## 2017-06-25 MED ORDER — DIPHENHYDRAMINE HCL 50 MG/ML IJ SOLN
25.0000 mg | Freq: Once | INTRAMUSCULAR | Status: AC
  Administered 2017-06-25: 25 mg via INTRAVENOUS

## 2017-06-25 MED ORDER — FAMOTIDINE IN NACL 20-0.9 MG/50ML-% IV SOLN
20.0000 mg | Freq: Once | INTRAVENOUS | Status: AC
  Administered 2017-06-25: 20 mg via INTRAVENOUS

## 2017-06-25 MED ORDER — SODIUM CHLORIDE 0.9% FLUSH
10.0000 mL | INTRAVENOUS | Status: DC | PRN
  Administered 2017-06-25: 10 mL
  Filled 2017-06-25: qty 10

## 2017-06-25 MED ORDER — HEPARIN SOD (PORK) LOCK FLUSH 100 UNIT/ML IV SOLN
500.0000 [IU] | Freq: Once | INTRAVENOUS | Status: AC | PRN
  Administered 2017-06-25: 500 [IU]
  Filled 2017-06-25: qty 5

## 2017-06-25 MED ORDER — SODIUM CHLORIDE 0.9 % IV SOLN
80.0000 mg/m2 | Freq: Once | INTRAVENOUS | Status: AC
  Administered 2017-06-25: 150 mg via INTRAVENOUS
  Filled 2017-06-25: qty 25

## 2017-06-25 MED ORDER — SODIUM CHLORIDE 0.9 % IV SOLN
Freq: Once | INTRAVENOUS | Status: AC
  Administered 2017-06-25: 11:00:00 via INTRAVENOUS

## 2017-06-25 MED ORDER — INFLUENZA VAC SPLIT QUAD 0.5 ML IM SUSY
0.5000 mL | PREFILLED_SYRINGE | Freq: Once | INTRAMUSCULAR | Status: AC
  Administered 2017-06-25: 0.5 mL via INTRAMUSCULAR
  Filled 2017-06-25: qty 0.5

## 2017-06-25 MED ORDER — SODIUM CHLORIDE 0.9% FLUSH
10.0000 mL | Freq: Once | INTRAVENOUS | Status: AC
  Administered 2017-06-25: 10 mL
  Filled 2017-06-25: qty 10

## 2017-06-25 NOTE — Patient Instructions (Signed)
[facility] Discharge Instructions for Patients Receiving Chemotherapy  Today you received the following chemotherapy agents :  Taxol.  To help prevent nausea and vomiting after your treatment, we encourage you to take your nausea medication as prescribed.   If you develop nausea and vomiting that is not controlled by your nausea medication, call the clinic.   BELOW ARE SYMPTOMS THAT SHOULD BE REPORTED IMMEDIATELY:  *FEVER GREATER THAN 100.5 F  *CHILLS WITH OR WITHOUT FEVER  NAUSEA AND VOMITING THAT IS NOT CONTROLLED WITH YOUR NAUSEA MEDICATION  *UNUSUAL SHORTNESS OF BREATH  *UNUSUAL BRUISING OR BLEEDING  TENDERNESS IN MOUTH AND THROAT WITH OR WITHOUT PRESENCE OF ULCERS  *URINARY PROBLEMS  *BOWEL PROBLEMS  UNUSUAL RASH Items with * indicate a potential emergency and should be followed up as soon as possible.  Feel free to call the clinic should you have any questions or concerns. The clinic phone number is [phone].  Please show the CHEMO ALERT CARD at check-in to the Emergency Department and triage nurse.   

## 2017-06-27 ENCOUNTER — Telehealth: Payer: Self-pay

## 2017-06-27 NOTE — Telephone Encounter (Signed)
Pt called to request and see if there is anything else she can take for sleep. Pt states that she takes decadron after her chemo for nausea and it is keeping her up at night. Pt states that she only had 1 hr of sleep the night before and didn't sleep until 5am this morning. She took 0.75 of ativan and still did not help. Wanted to see if there were anything else she can take.  Told pt to try taking 1mg  of ativan and also try low dose melatonin for a few days. Pt verbalized understanding. Pt also complaining of mild leg cramping. She used to take magnesium supplement for these and didn't know if she can get back on it. Told pt that she may take magnesium to help her cramping episode. Told pt to call back on Monday and report if she is feeling better. Pt verbalized understanding and has no further concerns art this time.

## 2017-07-02 ENCOUNTER — Ambulatory Visit: Payer: BLUE CROSS/BLUE SHIELD

## 2017-07-02 ENCOUNTER — Telehealth: Payer: Self-pay | Admitting: Hematology and Oncology

## 2017-07-02 ENCOUNTER — Other Ambulatory Visit (HOSPITAL_BASED_OUTPATIENT_CLINIC_OR_DEPARTMENT_OTHER): Payer: BLUE CROSS/BLUE SHIELD

## 2017-07-02 ENCOUNTER — Ambulatory Visit (HOSPITAL_BASED_OUTPATIENT_CLINIC_OR_DEPARTMENT_OTHER): Payer: BLUE CROSS/BLUE SHIELD | Admitting: Adult Health

## 2017-07-02 ENCOUNTER — Other Ambulatory Visit: Payer: BLUE CROSS/BLUE SHIELD

## 2017-07-02 ENCOUNTER — Ambulatory Visit (HOSPITAL_BASED_OUTPATIENT_CLINIC_OR_DEPARTMENT_OTHER): Payer: BLUE CROSS/BLUE SHIELD

## 2017-07-02 VITALS — BP 129/55 | HR 70 | Temp 98.2°F | Resp 18 | Ht 66.0 in | Wt 171.1 lb

## 2017-07-02 DIAGNOSIS — Z5111 Encounter for antineoplastic chemotherapy: Secondary | ICD-10-CM | POA: Diagnosis not present

## 2017-07-02 DIAGNOSIS — C50411 Malignant neoplasm of upper-outer quadrant of right female breast: Secondary | ICD-10-CM

## 2017-07-02 DIAGNOSIS — Z95828 Presence of other vascular implants and grafts: Secondary | ICD-10-CM

## 2017-07-02 DIAGNOSIS — Z170 Estrogen receptor positive status [ER+]: Secondary | ICD-10-CM

## 2017-07-02 LAB — CBC WITH DIFFERENTIAL/PLATELET
BASO%: 1.7 % (ref 0.0–2.0)
Basophils Absolute: 0.1 10*3/uL (ref 0.0–0.1)
EOS%: 1.1 % (ref 0.0–7.0)
Eosinophils Absolute: 0.1 10*3/uL (ref 0.0–0.5)
HCT: 30.4 % — ABNORMAL LOW (ref 34.8–46.6)
HGB: 10.3 g/dL — ABNORMAL LOW (ref 11.6–15.9)
LYMPH%: 13.7 % — AB (ref 14.0–49.7)
MCH: 31.8 pg (ref 25.1–34.0)
MCHC: 34 g/dL (ref 31.5–36.0)
MCV: 93.5 fL (ref 79.5–101.0)
MONO#: 0.5 10*3/uL (ref 0.1–0.9)
MONO%: 8.6 % (ref 0.0–14.0)
NEUT#: 4.1 10*3/uL (ref 1.5–6.5)
NEUT%: 74.9 % (ref 38.4–76.8)
PLATELETS: 197 10*3/uL (ref 145–400)
RBC: 3.25 10*6/uL — AB (ref 3.70–5.45)
RDW: 17.2 % — ABNORMAL HIGH (ref 11.2–14.5)
WBC: 5.4 10*3/uL (ref 3.9–10.3)
lymph#: 0.7 10*3/uL — ABNORMAL LOW (ref 0.9–3.3)

## 2017-07-02 LAB — COMPREHENSIVE METABOLIC PANEL
ALBUMIN: 3.7 g/dL (ref 3.5–5.0)
ALK PHOS: 49 U/L (ref 40–150)
ALT: 55 U/L (ref 0–55)
AST: 25 U/L (ref 5–34)
Anion Gap: 7 mEq/L (ref 3–11)
BUN: 15.1 mg/dL (ref 7.0–26.0)
CO2: 25 mEq/L (ref 22–29)
CREATININE: 0.6 mg/dL (ref 0.6–1.1)
Calcium: 8.8 mg/dL (ref 8.4–10.4)
Chloride: 106 mEq/L (ref 98–109)
EGFR: >60 mL/min/{1.73_m2} (ref >60)
GLUCOSE: 83 mg/dL (ref 70–140)
POTASSIUM: 4.2 meq/L (ref 3.5–5.1)
SODIUM: 138 meq/L (ref 136–145)
TOTAL PROTEIN: 6.6 g/dL (ref 6.4–8.3)
Total Bilirubin: 0.71 mg/dL (ref 0.20–1.20)

## 2017-07-02 MED ORDER — DIPHENHYDRAMINE HCL 50 MG/ML IJ SOLN
INTRAMUSCULAR | Status: AC
  Filled 2017-07-02: qty 1

## 2017-07-02 MED ORDER — SODIUM CHLORIDE 0.9% FLUSH
10.0000 mL | INTRAVENOUS | Status: DC | PRN
  Administered 2017-07-02: 10 mL
  Filled 2017-07-02: qty 10

## 2017-07-02 MED ORDER — DIPHENHYDRAMINE HCL 50 MG/ML IJ SOLN
25.0000 mg | Freq: Once | INTRAMUSCULAR | Status: AC
  Administered 2017-07-02: 25 mg via INTRAVENOUS

## 2017-07-02 MED ORDER — HEPARIN SOD (PORK) LOCK FLUSH 100 UNIT/ML IV SOLN
500.0000 [IU] | Freq: Once | INTRAVENOUS | Status: AC | PRN
  Administered 2017-07-02: 500 [IU]
  Filled 2017-07-02: qty 5

## 2017-07-02 MED ORDER — SODIUM CHLORIDE 0.9 % IV SOLN
Freq: Once | INTRAVENOUS | Status: AC
  Administered 2017-07-02: 11:00:00 via INTRAVENOUS

## 2017-07-02 MED ORDER — FAMOTIDINE IN NACL 20-0.9 MG/50ML-% IV SOLN
INTRAVENOUS | Status: AC
  Filled 2017-07-02: qty 50

## 2017-07-02 MED ORDER — FAMOTIDINE IN NACL 20-0.9 MG/50ML-% IV SOLN
20.0000 mg | Freq: Once | INTRAVENOUS | Status: AC
  Administered 2017-07-02: 20 mg via INTRAVENOUS

## 2017-07-02 MED ORDER — SODIUM CHLORIDE 0.9% FLUSH
10.0000 mL | Freq: Once | INTRAVENOUS | Status: AC
  Administered 2017-07-02: 10 mL
  Filled 2017-07-02: qty 10

## 2017-07-02 MED ORDER — PACLITAXEL CHEMO INJECTION 300 MG/50ML
80.0000 mg/m2 | Freq: Once | INTRAVENOUS | Status: AC
  Administered 2017-07-02: 150 mg via INTRAVENOUS
  Filled 2017-07-02: qty 25

## 2017-07-02 NOTE — Patient Instructions (Signed)
[facility] Discharge Instructions for Patients Receiving Chemotherapy  Today you received the following chemotherapy agent: Taxol  To help prevent nausea and vomiting after your treatment, we encourage you to take your nausea medication as prescribed.   If you develop nausea and vomiting that is not controlled by your nausea medication, call the clinic.   BELOW ARE SYMPTOMS THAT SHOULD BE REPORTED IMMEDIATELY:  *FEVER GREATER THAN 100.5 F  *CHILLS WITH OR WITHOUT FEVER  NAUSEA AND VOMITING THAT IS NOT CONTROLLED WITH YOUR NAUSEA MEDICATION  *UNUSUAL SHORTNESS OF BREATH  *UNUSUAL BRUISING OR BLEEDING  TENDERNESS IN MOUTH AND THROAT WITH OR WITHOUT PRESENCE OF ULCERS  *URINARY PROBLEMS  *BOWEL PROBLEMS  UNUSUAL RASH Items with * indicate a potential emergency and should be followed up as soon as possible.  Feel free to call the clinic should you have any questions or concerns. The clinic phone number is [phone].  Please show the CHEMO ALERT CARD at check-in to the Emergency Department and triage nurse.   

## 2017-07-02 NOTE — Progress Notes (Signed)
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  The STAT Breast cancer panel offered by [facility] includes sequencing and rearrangement analysis for the following 9 genes:  ATM, BRCA1, BRCA2, CDH1, CHEK2, PALB2, PTEN, STK11 and TP53.   The report date is March 09, 2017.  Re-requitisioned to the common hereditary cancer panel and the melanoma panel.  The additional genes tested were negative.  The Hereditary Gene Panel offered by [facility] includes sequencing and/or deletion duplication testing of the following 46 genes: APC, ATM, AXIN2, BARD1, BMPR1A, BRCA1, BRCA2, BRIP1, CDH1, CDKN2A (p14ARF), CDKN2A (p16INK4a), CHEK2, CTNNA1, DICER1, EPCAM (Deletion/duplication testing only), GREM1 (promoter region deletion/duplication testing only), KIT, MEN1, MLH1, MSH2, MSH3, MSH6, MUTYH, NBN, NF1, NHTL1, PALB2, PDGFRA, PMS2, POLD1, POLE, PTEN, RAD50, RAD51C, RAD51D, SDHB,  SDHC, SDHD, SMAD4, SMARCA4. STK11, TP53, TSC1, TSC2, and VHL.  The following genes were evaluated for sequence changes only: SDHA and HOXB13 c.251G>A variant only.  The Melanoma panel offered by [facility] includes sequencing and/or deletion duplication testing of the following 12 genes: BAP1, BRCA1, BRCA2, BRIP1, CDK4, CDKN2A (p14ARF), CDKN2A (p16INK4a), MC1R, POT1, PTEN, RB1, TERT, and TP53.  The following gene was evaluated for sequence changes only: MITF (c.952G>A, p.GLU318Lys variant only). The updated report date is March 14, 2017.        03/19/2017 Surgery    Right lumpectomy: IDC with DCIS, 1.2 cm, grade 2, margins negative, 0/2 lymph nodes negative, ER 100%, PR 10%, HER-2 negative ratio 1.73, Ki-67 40% T1BN0 stage IA      04/01/2017 Oncotype testing    Oncotype Dx score: 41 (28% ROR with Tamoxifen alone)      04/16/2017 -  Chemotherapy    Dose dense Adriamycin and Cytoxan 4 followed by Taxol weekly 12        CURRENT THERAPY: Weekly Paclitaxel  INTERVAL HISTORY: Rebecca [name] 61 y.o. female returns for evaluation prior to receiving chemotherapy with Paclitaxel.  This is week 4. She is doing moderately well.  She is having some difficulty sleeping.  She is taking Lorazepam and Melatonin.  The one night she did sleep she took two lorazepam and one melatonin, however she felt like a zombie at work.  She is not exercising however did this previously.  She does have some mild pain in her legs at night that has improved with taking Magnesium.     Patient Active Problem List   Diagnosis Date Noted  .  Port catheter in place 04/23/2017  . Genetic testing 03/10/2017  . Carcinoma of upper-outer quadrant of right breast in female, estrogen receptor positive ([place]) 03/05/2017  . 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 Family history of breast cancer   . Family history of melanoma   . Family history of colon cancer   . Family history of brain cancer   . Routine general medical  examination at a health care facility 03/20/2016  . Migraine 08/24/2015  . Hyperlipidemia 07/02/2013  . Vitamin D deficiency 07/02/2013    is allergic to citalopram; citalopram; dexamethasone; dexamethasone; dilaudid [hydromorphone hcl]; propoxycaine; red yeast rice [cholestin]; rofecoxib; and sulfamethoxazole.  MEDICAL HISTORY: Past Medical History:  Diagnosis Date  . Depression    REMISSION/ SITUATIONAL  . Dyspnea   . Family history of brain cancer   . Family history of breast cancer   . Family history of colon cancer   . Family history of melanoma   . Hepatitis AGE 89   RESOLVED  . Hyperlipidemia   . Insomnia   . Migraines    HORMONAL  . Shingles outbreak 2003, 2015   RIGHT HIP, and between breasts 3 years ago    SURGICAL HISTORY: Past Surgical History:  Procedure Laterality Date  . KNEE ARTHROSCOPY Right 2002  . TUBAL LIGATION    . uterine polyps  2016    SOCIAL HISTORY: Social History   Socioeconomic History  . Marital status: Married    Spouse name: Not on file  . Number of children: Not on file  . Years of education: Not on file  . Highest education level: Not on file  Social Needs  . Financial resource strain: Not on file  . Food insecurity - worry: Not on file  . Food insecurity - inability: Not on file  . Transportation needs - medical: Not on file  . Transportation needs - non-medical: Not on file  Occupational History  . Not on file  Tobacco Use  . Smoking status: Former Smoker    Last attempt to quit: 08/26/2008    Years since quitting: 8.8  . Smokeless tobacco: Never Used  Substance and Sexual Activity  . Alcohol use: No    Comment: OCCASIONAL  . Drug use: No  . Sexual activity: Yes    Birth control/protection: None  Other Topics Concern  . Not on file  Social History Narrative   ** Merged History Encounter **        FAMILY HISTORY: Family History  Problem Relation Age of Onset  . Depression Brother   . Lung cancer Brother 62        maternal half brother  . Dementia Mother        Rebecca [name]  . Cancer Father 16       MELANOMA CAUSED DEATH/melanoma  . Cancer Brother        multiple myeloma/lung - maternal half brother  . Stroke Maternal Grandmother 80  . Cancer Sister 44       breast- deceased at 20 - 1/2 sister  . Breast cancer Sister   . Heart disease Maternal Grandfather   . Cancer Paternal Grandfather        COLON  . Cervical cancer Maternal Aunt   . Lung cancer Maternal Uncle   . Other Paternal Grandmother        died in childbirth  . Dementia Maternal Aunt     Review of Systems  Constitutional: Positive for fatigue. Negative for appetite change, chills, fever  and unexpected weight change.  HENT:   Negative for hearing loss and lump/mass.   Eyes: Negative for eye problems and icterus.  Respiratory: Negative for chest tightness, cough and shortness of breath.   Cardiovascular: Negative for chest pain, leg swelling and palpitations.  Gastrointestinal: Negative for abdominal distention, abdominal pain, constipation, diarrhea, nausea and vomiting.  Endocrine: Negative for hot flashes.  Musculoskeletal: Negative for arthralgias.  Skin: Negative for itching and rash.  Neurological: Negative for dizziness, extremity weakness, headaches and numbness.  Hematological: Negative for adenopathy. Does not bruise/bleed easily.  Psychiatric/Behavioral: Positive for sleep disturbance (see interval history). Negative for depression. The patient is not nervous/anxious.       PHYSICAL EXAMINATION  ECOG PERFORMANCE STATUS: 1 - Symptomatic but completely ambulatory  Vitals:   07/02/17 1005  BP: (!) 129/55  Pulse: 70  Resp: 18  Temp: 98.2 F (36.8 C)  SpO2: 100%    Physical Exam  Constitutional: She is oriented to person, place, and time and well-developed, well-nourished, and in no distress.  HENT:  Head: Normocephalic and atraumatic.  Mouth/Throat: Oropharynx is clear and moist. No oropharyngeal exudate.   Eyes: Pupils are equal, round, and reactive to light. No scleral icterus.  Neck: Neck supple.  Cardiovascular: Normal rate, regular rhythm and normal heart sounds.  Pulmonary/Chest: Effort normal and breath sounds normal. No respiratory distress. She has no wheezes. She has no rales. She exhibits no tenderness.  Abdominal: Soft. Bowel sounds are normal. She exhibits no distension and no mass. There is no tenderness. There is no rebound and no guarding.  Musculoskeletal: She exhibits no edema.  Lymphadenopathy:    She has no cervical adenopathy.  Neurological: She is alert and oriented to person, place, and time.  Skin: Skin is warm and dry. No rash noted.  Psychiatric: Mood and affect normal.    LABORATORY DATA:  CBC    Component Value Date/Time   WBC 5.4 07/02/2017 0855   WBC 4.9 04/07/2017 0931   RBC 3.25 (L) 07/02/2017 0855   RBC 4.50 04/07/2017 0931   HGB 10.3 (L) 07/02/2017 0855   HCT 30.4 (L) 07/02/2017 0855   PLT 197 07/02/2017 0855   MCV 93.5 07/02/2017 0855   MCH 31.8 07/02/2017 0855   MCH 30.4 04/07/2017 0931   MCHC 34.0 07/02/2017 0855   MCHC 33.7 04/07/2017 0931   RDW 17.2 (H) 07/02/2017 0855   LYMPHSABS 0.7 (L) 07/02/2017 0855   MONOABS 0.5 07/02/2017 0855   EOSABS 0.1 07/02/2017 0855   BASOSABS 0.1 07/02/2017 0855    CMP     Component Value Date/Time   NA 138 07/02/2017 0855   K 4.2 07/02/2017 0855   CL 105 04/07/2017 0931   CO2 25 07/02/2017 0855   GLUCOSE 83 07/02/2017 0855   BUN 15.1 07/02/2017 0855   CREATININE 0.6 07/02/2017 0855   CALCIUM 8.8 07/02/2017 0855   PROT 6.6 07/02/2017 0855   ALBUMIN 3.7 07/02/2017 0855   AST 25 07/02/2017 0855   ALT 55 07/02/2017 0855   ALKPHOS 49 07/02/2017 0855   BILITOT 0.71 07/02/2017 0855   GFRNONAA >89 04/07/2017 0931   GFRAA >89 04/07/2017 0931       ASSESSMENT and PLAN:   Carcinoma of upper-outer quadrant of right breast in female, estrogen receptor positive ([place]) 03/19/2017: Right lumpectomy:  IDC with DCIS, 1.2 cm, grade 2, margins negative, 0/2 lymph nodes negative, ER 100%, PR 10%, HER-2 negative ratio 1.73, Ki-67 40% T1BN0 stage IA CHEK 2 Mutation Positive Oncotype  Dx score: 41 (28% ROR with Tamoxifen alone) Treatment plan: 1. Adjuvant chemotherapy with dose dense Adriamycin Cytoxan 4 followed by weekly Taxol 12 2. followed by adjuvant radiation 3. Followed by adjuvant antiestrogen therapy ---------------------------------------------------------------------------- Current treatment: Cycle 4 of weekly Taxol Toxicities: 1.  Darkening of the nailbeds 2. Difficulty Sleeping: Recommended she walk daily and continue Lorazepam and Melatonin 3. Leg aches: continue magnesium, will f/u with CMET today.  Thessaly will return weekly for labs and chemotherapy weekly, and will see myself or Dr. Lindi Adie with every other treatment.      All questions were answered. The patient knows to call the clinic with any problems, questions or concerns. We can certainly see the patient much sooner if necessary.  A total of (30) minutes of face-to-face time was spent with this patient with greater than 50% of that time in counseling and care-coordination.  This note was electronically signed. Scot Dock, NP 07/02/2017

## 2017-07-02 NOTE — Telephone Encounter (Signed)
Added follow up appointments every other week starting 11/21 per 11/7 los. Gave patient avs report and appointments for November thru January

## 2017-07-02 NOTE — Assessment & Plan Note (Addendum)
03/19/2017: Right lumpectomy: IDC with DCIS, 1.2 cm, grade 2, margins negative, 0/2 lymph nodes negative, ER 100%, PR 10%, HER-2 negative ratio 1.73, Ki-67 40% T1BN0 stage IA CHEK 2 Mutation Positive Oncotype Dx score: 41 (28% ROR with Tamoxifen alone) Treatment plan: 1. Adjuvant chemotherapy with dose dense Adriamycin Cytoxan 4 followed by weekly Taxol 12 2. followed by adjuvant radiation 3. Followed by adjuvant antiestrogen therapy ---------------------------------------------------------------------------- Current treatment: Cycle 4 of weekly Taxol Toxicities: 1.  Darkening of the nailbeds 2. Difficulty Sleeping: Recommended she walk daily and continue Lorazepam and Melatonin 3. Leg aches: continue magnesium, will f/u with CMET today.  Rebecca [name] will return weekly for labs and chemotherapy weekly, and will see myself or Dr. Lindi Adie with every other treatment.

## 2017-07-09 ENCOUNTER — Other Ambulatory Visit (HOSPITAL_BASED_OUTPATIENT_CLINIC_OR_DEPARTMENT_OTHER): Payer: BLUE CROSS/BLUE SHIELD

## 2017-07-09 ENCOUNTER — Ambulatory Visit (HOSPITAL_BASED_OUTPATIENT_CLINIC_OR_DEPARTMENT_OTHER): Payer: BLUE CROSS/BLUE SHIELD

## 2017-07-09 ENCOUNTER — Other Ambulatory Visit: Payer: Self-pay | Admitting: Hematology and Oncology

## 2017-07-09 ENCOUNTER — Ambulatory Visit: Payer: BLUE CROSS/BLUE SHIELD

## 2017-07-09 DIAGNOSIS — Z5111 Encounter for antineoplastic chemotherapy: Secondary | ICD-10-CM | POA: Diagnosis not present

## 2017-07-09 DIAGNOSIS — Z170 Estrogen receptor positive status [ER+]: Secondary | ICD-10-CM

## 2017-07-09 DIAGNOSIS — C50411 Malignant neoplasm of upper-outer quadrant of right female breast: Secondary | ICD-10-CM

## 2017-07-09 DIAGNOSIS — Z95828 Presence of other vascular implants and grafts: Secondary | ICD-10-CM

## 2017-07-09 LAB — CBC WITH DIFFERENTIAL/PLATELET
BASO%: 1.4 % (ref 0.0–2.0)
Basophils Absolute: 0.1 10*3/uL (ref 0.0–0.1)
EOS ABS: 0.1 10*3/uL (ref 0.0–0.5)
EOS%: 1.2 % (ref 0.0–7.0)
HEMATOCRIT: 30.6 % — AB (ref 34.8–46.6)
HGB: 10.3 g/dL — ABNORMAL LOW (ref 11.6–15.9)
LYMPH#: 0.7 10*3/uL — AB (ref 0.9–3.3)
LYMPH%: 14.7 % (ref 14.0–49.7)
MCH: 31.8 pg (ref 25.1–34.0)
MCHC: 33.8 g/dL (ref 31.5–36.0)
MCV: 94.2 fL (ref 79.5–101.0)
MONO#: 0.4 10*3/uL (ref 0.1–0.9)
MONO%: 8.5 % (ref 0.0–14.0)
NEUT#: 3.4 10*3/uL (ref 1.5–6.5)
NEUT%: 74.2 % (ref 38.4–76.8)
PLATELETS: 185 10*3/uL (ref 145–400)
RBC: 3.24 10*6/uL — ABNORMAL LOW (ref 3.70–5.45)
RDW: 16.8 % — ABNORMAL HIGH (ref 11.2–14.5)
WBC: 4.5 10*3/uL (ref 3.9–10.3)

## 2017-07-09 LAB — COMPREHENSIVE METABOLIC PANEL
ALBUMIN: 3.7 g/dL (ref 3.5–5.0)
ALK PHOS: 55 U/L (ref 40–150)
ALT: 95 U/L — ABNORMAL HIGH (ref 0–55)
ANION GAP: 6 meq/L (ref 3–11)
AST: 53 U/L — AB (ref 5–34)
BUN: 15 mg/dL (ref 7.0–26.0)
CALCIUM: 9 mg/dL (ref 8.4–10.4)
CHLORIDE: 106 meq/L (ref 98–109)
CO2: 26 mEq/L (ref 22–29)
CREATININE: 0.6 mg/dL (ref 0.6–1.1)
EGFR: >60 mL/min/{1.73_m2} (ref >60)
Glucose: 83 mg/dl (ref 70–140)
POTASSIUM: 4.1 meq/L (ref 3.5–5.1)
Sodium: 138 mEq/L (ref 136–145)
Total Bilirubin: 0.69 mg/dL (ref 0.20–1.20)
Total Protein: 6.7 g/dL (ref 6.4–8.3)

## 2017-07-09 MED ORDER — HEPARIN SOD (PORK) LOCK FLUSH 100 UNIT/ML IV SOLN
500.0000 [IU] | Freq: Once | INTRAVENOUS | Status: AC | PRN
  Administered 2017-07-09: 500 [IU]
  Filled 2017-07-09: qty 5

## 2017-07-09 MED ORDER — DIPHENHYDRAMINE HCL 50 MG/ML IJ SOLN
25.0000 mg | Freq: Once | INTRAMUSCULAR | Status: AC
  Administered 2017-07-09: 25 mg via INTRAVENOUS

## 2017-07-09 MED ORDER — SODIUM CHLORIDE 0.9 % IV SOLN: 80.0000 mg/m2 | Freq: Once | INTRAVENOUS | Status: DC

## 2017-07-09 MED ORDER — SODIUM CHLORIDE 0.9 % IV SOLN
Freq: Once | INTRAVENOUS | Status: AC
  Administered 2017-07-09: 10:00:00 via INTRAVENOUS

## 2017-07-09 MED ORDER — SODIUM CHLORIDE 0.9% FLUSH
10.0000 mL | INTRAVENOUS | Status: DC | PRN
  Administered 2017-07-09: 10 mL
  Filled 2017-07-09: qty 10

## 2017-07-09 MED ORDER — FAMOTIDINE IN NACL 20-0.9 MG/50ML-% IV SOLN
INTRAVENOUS | Status: AC
  Filled 2017-07-09: qty 50

## 2017-07-09 MED ORDER — SODIUM CHLORIDE 0.9 % IV SOLN
130.0000 mg | Freq: Once | INTRAVENOUS | Status: AC
  Administered 2017-07-09: 132 mg via INTRAVENOUS
  Filled 2017-07-09: qty 22

## 2017-07-09 MED ORDER — DIPHENHYDRAMINE HCL 50 MG/ML IJ SOLN
INTRAMUSCULAR | Status: AC
  Filled 2017-07-09: qty 1

## 2017-07-09 MED ORDER — SODIUM CHLORIDE 0.9% FLUSH
10.0000 mL | Freq: Once | INTRAVENOUS | Status: AC
  Administered 2017-07-09: 10 mL
  Filled 2017-07-09: qty 10

## 2017-07-09 MED ORDER — FAMOTIDINE IN NACL 20-0.9 MG/50ML-% IV SOLN
20.0000 mg | Freq: Once | INTRAVENOUS | Status: AC
  Administered 2017-07-09: 20 mg via INTRAVENOUS

## 2017-07-09 NOTE — Progress Notes (Signed)
ALT = 95; AST = 53 Dr. Lindi Adie notified and he called pharmacy and gave v.o. To decr Taxol dose to 130 mg.  However dose was rounded and came to 132 mg after attempted to manually change to 130 mg. Kennith Center, Pharm.D., CPP 07/09/2017@10 :20 AM

## 2017-07-09 NOTE — Patient Instructions (Signed)
Cancer Center Discharge Instructions for Patients Receiving Chemotherapy  Today you received the following chemotherapy agents: Paclitaxel (Taxol)  To help prevent nausea and vomiting after your treatment, we encourage you to take your nausea medication as prescribed. If you develop nausea and vomiting that is not controlled by your nausea medication, call the clinic.   BELOW ARE SYMPTOMS THAT SHOULD BE REPORTED IMMEDIATELY:  *FEVER GREATER THAN 100.5 F  *CHILLS WITH OR WITHOUT FEVER  NAUSEA AND VOMITING THAT IS NOT CONTROLLED WITH YOUR NAUSEA MEDICATION  *UNUSUAL SHORTNESS OF BREATH  *UNUSUAL BRUISING OR BLEEDING  TENDERNESS IN MOUTH AND THROAT WITH OR WITHOUT PRESENCE OF ULCERS  *URINARY PROBLEMS  *BOWEL PROBLEMS  UNUSUAL RASH Items with * indicate a potential emergency and should be followed up as soon as possible.  Feel free to call the clinic should you have any questions or concerns. The clinic phone number is [phone].  Please show the CHEMO ALERT CARD at check-in to the Emergency Department and triage nurse.   

## 2017-07-09 NOTE — Progress Notes (Signed)
Pt's AST: 53 and ALT: 95. Called Dr. Lindi Adie around 10:15 to see if he still wanted pt to receive Taxol infusion. Was told by desk RN that Dr. Lindi Adie still wanted pt to receive treatment, but that he would decrease today's dose. Called pharmacy with update. Explained new plan to pt who verbalized understanding and agreement. Will continue to monitor

## 2017-07-09 NOTE — Patient Instructions (Signed)
Implanted Port Home Guide An implanted port is a type of central line that is placed under the skin. Central lines are used to provide IV access when treatment or nutrition needs to be given through a person's veins. Implanted ports are used for long-term IV access. An implanted port may be placed because:  You need IV medicine that would be irritating to the small veins in your hands or arms.  You need long-term IV medicines, such as antibiotics.  You need IV nutrition for a long period.  You need frequent blood draws for lab tests.  You need dialysis.  Implanted ports are usually placed in the chest area, but they can also be placed in the upper arm, the abdomen, or the leg. An implanted port has two main parts:  Reservoir. The reservoir is round and will appear as a small, raised area under your skin. The reservoir is the part where a needle is inserted to give medicines or draw blood.  Catheter. The catheter is a thin, flexible tube that extends from the reservoir. The catheter is placed into a large vein. Medicine that is inserted into the reservoir goes into the catheter and then into the vein.  How will I care for my incision site? Do not get the incision site wet. Bathe or shower as directed by your health care provider. How is my port accessed? Special steps must be taken to access the port:  Before the port is accessed, a numbing cream can be placed on the skin. This helps numb the skin over the port site.  Your health care provider uses a sterile technique to access the port. ? Your health care provider must put on a mask and sterile gloves. ? The skin over your port is cleaned carefully with an antiseptic and allowed to dry. ? The port is gently pinched between sterile gloves, and a needle is inserted into the port.  Only "non-coring" port needles should be used to access the port. Once the port is accessed, a blood return should be checked. This helps ensure that the port  is in the vein and is not clogged.  If your port needs to remain accessed for a constant infusion, a clear (transparent) bandage will be placed over the needle site. The bandage and needle will need to be changed every week, or as directed by your health care provider.  Keep the bandage covering the needle clean and dry. Do not get it wet. Follow your health care provider's instructions on how to take a shower or bath while the port is accessed.  If your port does not need to stay accessed, no bandage is needed over the port.  What is flushing? Flushing helps keep the port from getting clogged. Follow your health care provider's instructions on how and when to flush the port. Ports are usually flushed with saline solution or a medicine called heparin. The need for flushing will depend on how the port is used.  If the port is used for intermittent medicines or blood draws, the port will need to be flushed: ? After medicines have been given. ? After blood has been drawn. ? As part of routine maintenance.  If a constant infusion is running, the port may not need to be flushed.  How long will my port stay implanted? The port can stay in for as long as your health care provider thinks it is needed. When it is time for the port to come out, surgery will be   done to remove it. The procedure is similar to the one performed when the port was put in. When should I seek immediate medical care? When you have an implanted port, you should seek immediate medical care if:  You notice a bad smell coming from the incision site.  You have swelling, redness, or drainage at the incision site.  You have more swelling or pain at the port site or the surrounding area.  You have a fever that is not controlled with medicine.  This information is not intended to replace advice given to you by your health care provider. Make sure you discuss any questions you have with your health care provider. Document  Released: 08/12/2005 Document Revised: 01/18/2016 Document Reviewed: 04/19/2013 Elsevier Interactive Patient Education  2017 [employer] Inc.  

## 2017-07-16 ENCOUNTER — Ambulatory Visit (HOSPITAL_BASED_OUTPATIENT_CLINIC_OR_DEPARTMENT_OTHER): Payer: BLUE CROSS/BLUE SHIELD

## 2017-07-16 ENCOUNTER — Other Ambulatory Visit (HOSPITAL_BASED_OUTPATIENT_CLINIC_OR_DEPARTMENT_OTHER): Payer: BLUE CROSS/BLUE SHIELD

## 2017-07-16 ENCOUNTER — Encounter: Payer: Self-pay | Admitting: Adult Health

## 2017-07-16 ENCOUNTER — Ambulatory Visit (HOSPITAL_BASED_OUTPATIENT_CLINIC_OR_DEPARTMENT_OTHER): Payer: BLUE CROSS/BLUE SHIELD | Admitting: Adult Health

## 2017-07-16 ENCOUNTER — Ambulatory Visit: Payer: BLUE CROSS/BLUE SHIELD

## 2017-07-16 VITALS — BP 146/84 | HR 76 | Temp 98.1°F | Resp 18 | Ht 66.0 in | Wt 172.4 lb

## 2017-07-16 DIAGNOSIS — C50411 Malignant neoplasm of upper-outer quadrant of right female breast: Secondary | ICD-10-CM

## 2017-07-16 DIAGNOSIS — Z95828 Presence of other vascular implants and grafts: Secondary | ICD-10-CM

## 2017-07-16 DIAGNOSIS — Z5111 Encounter for antineoplastic chemotherapy: Secondary | ICD-10-CM

## 2017-07-16 DIAGNOSIS — Z170 Estrogen receptor positive status [ER+]: Secondary | ICD-10-CM

## 2017-07-16 LAB — COMPREHENSIVE METABOLIC PANEL
ALT: 69 U/L — ABNORMAL HIGH (ref 0–55)
AST: 36 U/L — ABNORMAL HIGH (ref 5–34)
Albumin: 3.7 g/dL (ref 3.5–5.0)
Alkaline Phosphatase: 59 U/L (ref 40–150)
Anion Gap: 7 mEq/L (ref 3–11)
BUN: 15.4 mg/dL (ref 7.0–26.0)
CO2: 25 meq/L (ref 22–29)
Calcium: 9.1 mg/dL (ref 8.4–10.4)
Chloride: 106 mEq/L (ref 98–109)
Creatinine: 0.7 mg/dL (ref 0.6–1.1)
GLUCOSE: 86 mg/dL (ref 70–140)
POTASSIUM: 4.1 meq/L (ref 3.5–5.1)
SODIUM: 138 meq/L (ref 136–145)
TOTAL PROTEIN: 6.7 g/dL (ref 6.4–8.3)
Total Bilirubin: 0.78 mg/dL (ref 0.20–1.20)

## 2017-07-16 LAB — CBC WITH DIFFERENTIAL/PLATELET
BASO%: 1 % (ref 0.0–2.0)
Basophils Absolute: 0 10*3/uL (ref 0.0–0.1)
EOS ABS: 0.1 10*3/uL (ref 0.0–0.5)
EOS%: 1.3 % (ref 0.0–7.0)
HCT: 31.6 % — ABNORMAL LOW (ref 34.8–46.6)
HGB: 10.5 g/dL — ABNORMAL LOW (ref 11.6–15.9)
LYMPH%: 18.9 % (ref 14.0–49.7)
MCH: 32 pg (ref 25.1–34.0)
MCHC: 33.2 g/dL (ref 31.5–36.0)
MCV: 96.3 fL (ref 79.5–101.0)
MONO#: 0.4 10*3/uL (ref 0.1–0.9)
MONO%: 10 % (ref 0.0–14.0)
NEUT%: 68.8 % (ref 38.4–76.8)
NEUTROS ABS: 2.6 10*3/uL (ref 1.5–6.5)
Platelets: 218 10*3/uL (ref 145–400)
RBC: 3.28 10*6/uL — AB (ref 3.70–5.45)
RDW: 15.2 % — ABNORMAL HIGH (ref 11.2–14.5)
WBC: 3.8 10*3/uL — AB (ref 3.9–10.3)
lymph#: 0.7 10*3/uL — ABNORMAL LOW (ref 0.9–3.3)

## 2017-07-16 MED ORDER — DIPHENHYDRAMINE HCL 50 MG/ML IJ SOLN
INTRAMUSCULAR | Status: AC
  Filled 2017-07-16: qty 1

## 2017-07-16 MED ORDER — FAMOTIDINE IN NACL 20-0.9 MG/50ML-% IV SOLN
20.0000 mg | Freq: Once | INTRAVENOUS | Status: AC
  Administered 2017-07-16: 20 mg via INTRAVENOUS

## 2017-07-16 MED ORDER — SODIUM CHLORIDE 0.9% FLUSH
10.0000 mL | INTRAVENOUS | Status: DC | PRN
  Administered 2017-07-16: 10 mL
  Filled 2017-07-16: qty 10

## 2017-07-16 MED ORDER — SODIUM CHLORIDE 0.9% FLUSH
10.0000 mL | Freq: Once | INTRAVENOUS | Status: AC
  Administered 2017-07-16: 10 mL
  Filled 2017-07-16: qty 10

## 2017-07-16 MED ORDER — SODIUM CHLORIDE 0.9 % IV SOLN
Freq: Once | INTRAVENOUS | Status: AC
  Administered 2017-07-16: 09:00:00 via INTRAVENOUS

## 2017-07-16 MED ORDER — HEPARIN SOD (PORK) LOCK FLUSH 100 UNIT/ML IV SOLN
500.0000 [IU] | Freq: Once | INTRAVENOUS | Status: AC | PRN
  Administered 2017-07-16: 500 [IU]
  Filled 2017-07-16: qty 5

## 2017-07-16 MED ORDER — LORAZEPAM 0.5 MG PO TABS: 0.5000 mg | ORAL_TABLET | Freq: Every day | ORAL | 1 refills | Status: DC

## 2017-07-16 MED ORDER — DIPHENHYDRAMINE HCL 50 MG/ML IJ SOLN
25.0000 mg | Freq: Once | INTRAMUSCULAR | Status: AC
  Administered 2017-07-16: 25 mg via INTRAVENOUS

## 2017-07-16 MED ORDER — FAMOTIDINE IN NACL 20-0.9 MG/50ML-% IV SOLN
INTRAVENOUS | Status: AC
  Filled 2017-07-16: qty 50

## 2017-07-16 MED ORDER — SODIUM CHLORIDE 0.9 % IV SOLN
70.0000 mg/m2 | Freq: Once | INTRAVENOUS | Status: AC
  Administered 2017-07-16: 132 mg via INTRAVENOUS
  Filled 2017-07-16: qty 22

## 2017-07-16 NOTE — Assessment & Plan Note (Addendum)
03/19/2017: Right lumpectomy: IDC with DCIS, 1.2 cm, grade 2, margins negative, 0/2 lymph nodes negative, ER 100%, PR 10%, HER-2 negative ratio 1.73, Ki-67 40% T1BN0 stage IA CHEK 2 Mutation Positive Oncotype Dx score: 41 (28% ROR with Tamoxifen alone) Treatment plan: 1. Adjuvant chemotherapy with dose dense Adriamycin Cytoxan 4 followed by weekly Taxol 12 2. followed by adjuvant radiation 3. Followed by adjuvant antiestrogen therapy ---------------------------------------------------------------------------- Current treatment: Cycle 6 of weekly Taxol Toxicities: 1.  Darkening of the nailbeds 2. Difficulty Sleeping: Recommended she walk daily and continue Lorazepam and Melatonin 3. Facial rash: ? Staph related.  May benefit from doxycycline if happens again.    Imagine will return weekly for labs and chemotherapy weekly, and will see myself or Dr. Lindi Adie with every other treatment.

## 2017-07-16 NOTE — Progress Notes (Signed)
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  The STAT Breast cancer panel offered by [facility] includes sequencing and rearrangement analysis for the following 9 genes:  ATM, BRCA1, BRCA2, CDH1, CHEK2, PALB2, PTEN, STK11 and TP53.   The report date is March 09, 2017.  Re-requitisioned to the common hereditary cancer panel and the melanoma panel.  The additional genes tested were negative.  The Hereditary Gene Panel offered by [facility] includes sequencing and/or deletion duplication testing of the following 46 genes: APC, ATM, AXIN2, BARD1, BMPR1A, BRCA1, BRCA2, BRIP1, CDH1, CDKN2A (p14ARF), CDKN2A (p16INK4a), CHEK2, CTNNA1, DICER1, EPCAM (Deletion/duplication testing only), GREM1 (promoter region deletion/duplication testing only), KIT, MEN1, MLH1, MSH2, MSH3, MSH6, MUTYH, NBN, NF1, NHTL1, PALB2, PDGFRA, PMS2, POLD1, POLE, PTEN, RAD50, RAD51C, RAD51D, SDHB,  SDHC, SDHD, SMAD4, SMARCA4. STK11, TP53, TSC1, TSC2, and VHL.  The following genes were evaluated for sequence changes only: SDHA and HOXB13 c.251G>A variant only.  The Melanoma panel offered by [facility] includes sequencing and/or deletion duplication testing of the following 12 genes: BAP1, BRCA1, BRCA2, BRIP1, CDK4, CDKN2A (p14ARF), CDKN2A (p16INK4a), MC1R, POT1, PTEN, RB1, TERT, and TP53.  The following gene was evaluated for sequence changes only: MITF (c.952G>A, p.GLU318Lys variant only). The updated report date is March 14, 2017.        03/19/2017 Surgery    Right lumpectomy: IDC with DCIS, 1.2 cm, grade 2, margins negative, 0/2 lymph nodes negative, ER 100%, PR 10%, HER-2 negative ratio 1.73, Ki-67 40% T1BN0 stage IA      04/01/2017 Oncotype testing    Oncotype Dx score: 41 (28% ROR with Tamoxifen alone)      04/16/2017 -  Chemotherapy    Dose dense Adriamycin and Cytoxan 4 followed by Taxol weekly 12        CURRENT THERAPY: Taxol, Week 6  INTERVAL HISTORY: Rebecca [name] 61 y.o. female returns for evaluation prior to receiving her sixth cycle of Taxol.  She continues to tolerate it well.  She had a few red bumps on her face that appeared after last weeks chemotherapy.  They resolved with applying neosporin.  She denies any issues, particularly peripheral neuropathy.     Patient Active Problem List   Diagnosis Date Noted  . Port catheter in place 04/23/2017  . Genetic testing 03/10/2017  . Carcinoma of upper-outer quadrant of right breast in female, estrogen receptor positive ([place]) 03/05/2017  . 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 Family history of breast cancer   . Family history of melanoma   . Family history of colon cancer   . Family history of brain cancer   . Routine general medical examination at a health care facility 03/20/2016  . Migraine 08/24/2015  . Hyperlipidemia 07/02/2013  . Vitamin D deficiency 07/02/2013    is allergic to citalopram;  citalopram; dexamethasone; dexamethasone; dilaudid [hydromorphone hcl]; propoxycaine; red yeast rice [cholestin]; rofecoxib; and sulfamethoxazole.  MEDICAL HISTORY: Past Medical History:  Diagnosis Date  . Depression    REMISSION/ SITUATIONAL  . Dyspnea   . Family history of brain cancer   . Family history of breast cancer   . Family history of colon cancer   . Family history of melanoma   . Hepatitis AGE 55   RESOLVED  . Hyperlipidemia   . Insomnia   . Migraines    HORMONAL  . Shingles outbreak 2003, 2015   RIGHT HIP, and between breasts 3 years ago    SURGICAL HISTORY: Past Surgical History:  Procedure Laterality Date  . BREAST LUMPECTOMY WITH RADIOACTIVE SEED AND SENTINEL LYMPH NODE BIOPSY Right 03/19/2017   Procedure: RIGHT BREAST LUMPECTOMY WITH RADIOACTIVE SEED AND SENTINEL LYMPH NODE BIOPSY;  Surgeon: Excell Seltzer, MD;  Location: [facility];  Service: General;  Laterality: Right;  . KNEE ARTHROSCOPY Right 2002  . TUBAL LIGATION    . uterine polyps  2016    SOCIAL HISTORY: Social History   Socioeconomic History  . Marital status: Married    Spouse name: Not on file  . Number of children: Not on file  . Years of education: Not on file  . Highest education level: Not on file  Social Needs  . Financial resource strain: Not on file  . Food insecurity - worry: Not on file  . Food insecurity - inability: Not on file  . Transportation needs - medical: Not on file  . Transportation needs - non-medical: Not on file  Occupational History  . Not on file  Tobacco Use  . Smoking status: Former Smoker    Last attempt to quit: 08/26/2008    Years since quitting: 8.8  . Smokeless tobacco: Never Used  Substance and Sexual Activity  . Alcohol use: No    Comment: OCCASIONAL  . Drug use: No  . Sexual activity: Yes    Birth control/protection: None  Other Topics Concern  . Not on file  Social History Narrative   ** Merged History Encounter **         FAMILY HISTORY: Family History  Problem Relation Age of Onset  . Depression Brother   . Lung cancer Brother 14       maternal half brother  . Dementia Mother        Delman Kitten  . Cancer Father 52       MELANOMA CAUSED DEATH/melanoma  . Cancer Brother        multiple myeloma/lung - maternal half brother  . Stroke Maternal Grandmother 80  . Cancer Sister 34       breast- deceased at 57 - 1/2 sister  . Breast cancer Sister   . Heart disease Maternal Grandfather   . Cancer Paternal Grandfather        COLON  . Cervical cancer Maternal Aunt   . Lung cancer Maternal Uncle   . Other Paternal Grandmother        died in childbirth  . Dementia Maternal Aunt     Review  of Systems  Constitutional: Negative for appetite change, chills, fatigue, fever and unexpected weight change.  HENT:   Negative for hearing loss, lump/mass and sore throat.   Eyes: Negative for eye problems and icterus.  Respiratory: Negative for chest tightness, cough and shortness of breath.   Cardiovascular: Negative for chest pain, leg swelling and palpitations.  Gastrointestinal: Negative for abdominal distention and abdominal pain.  Endocrine: Negative for hot flashes.  Skin: Negative for itching and rash.  Neurological: Negative for dizziness and headaches.  Hematological: Negative for adenopathy. Does not bruise/bleed easily.  Psychiatric/Behavioral: Negative for depression. The patient is not nervous/anxious.       PHYSICAL EXAMINATION  ECOG PERFORMANCE STATUS: 1  Vitals:   07/16/17 0844  BP: (!) 146/84  Pulse: 76  Resp: 18  Temp: 98.1 F (36.7 C)  SpO2: 100%    Physical Exam  Constitutional: She is oriented to person, place, and time and well-developed, well-nourished, and in no distress.  HENT:  Head: Normocephalic and atraumatic.  Mouth/Throat: Oropharynx is clear and moist. No oropharyngeal exudate.  Eyes: Pupils are equal, round, and reactive to light. No scleral icterus.  Neck:  Neck supple.  Cardiovascular: Normal rate, regular rhythm and normal heart sounds.  Pulmonary/Chest: Effort normal and breath sounds normal. No respiratory distress. She has no wheezes. She has no rales.  Abdominal: Soft. Bowel sounds are normal. She exhibits no distension and no mass. There is no tenderness. There is no rebound and no guarding.  Musculoskeletal: She exhibits no edema.  Lymphadenopathy:    She has no cervical adenopathy.  Neurological: She is alert and oriented to person, place, and time.  Skin: Skin is warm and dry. No rash noted.  Psychiatric: Mood and affect normal.    LABORATORY DATA:  CBC    Component Value Date/Time   WBC 3.8 (L) 07/16/2017 0805   WBC 4.9 04/07/2017 0931   RBC 3.28 (L) 07/16/2017 0805   RBC 4.50 04/07/2017 0931   HGB 10.5 (L) 07/16/2017 0805   HCT 31.6 (L) 07/16/2017 0805   PLT 218 07/16/2017 0805   MCV 96.3 07/16/2017 0805   MCH 32.0 07/16/2017 0805   MCH 30.4 04/07/2017 0931   MCHC 33.2 07/16/2017 0805   MCHC 33.7 04/07/2017 0931   RDW 15.2 (H) 07/16/2017 0805   LYMPHSABS 0.7 (L) 07/16/2017 0805   MONOABS 0.4 07/16/2017 0805   EOSABS 0.1 07/16/2017 0805   BASOSABS 0.0 07/16/2017 0805    CMP     Component Value Date/Time   NA 138 07/16/2017 0805   K 4.1 07/16/2017 0805   CL 105 04/07/2017 0931   CO2 25 07/16/2017 0805   GLUCOSE 86 07/16/2017 0805   BUN 15.4 07/16/2017 0805   CREATININE 0.7 07/16/2017 0805   CALCIUM 9.1 07/16/2017 0805   PROT 6.7 07/16/2017 0805   ALBUMIN 3.7 07/16/2017 0805   AST 36 (H) 07/16/2017 0805   ALT 69 (H) 07/16/2017 0805   ALKPHOS 59 07/16/2017 0805   BILITOT 0.78 07/16/2017 0805   GFRNONAA >89 04/07/2017 0931   GFRAA >89 04/07/2017 0931        ASSESSMENT and PLAN:   Carcinoma of upper-outer quadrant of right breast in female, estrogen receptor positive ([place]) 03/19/2017: Right lumpectomy: IDC with DCIS, 1.2 cm, grade 2, margins negative, 0/2 lymph nodes negative, ER 100%, PR 10%, HER-2  negative ratio 1.73, Ki-67 40% T1BN0 stage IA CHEK 2 Mutation Positive Oncotype Dx score: 41 (28% ROR with Tamoxifen alone) Treatment plan: 1.  Adjuvant chemotherapy with dose dense Adriamycin Cytoxan 4 followed by weekly Taxol 12 2. followed by adjuvant radiation 3. Followed by adjuvant antiestrogen therapy ---------------------------------------------------------------------------- Current treatment: Cycle 6 of weekly Taxol Toxicities: 1.  Darkening of the nailbeds 2. Difficulty Sleeping: Recommended she walk daily and continue Lorazepam and Melatonin 3. Facial rash: ? Staph related.  May benefit from doxycycline if happens again.    Vanity will return weekly for labs and chemotherapy weekly, and will see myself or Dr. Lindi Adie with every other treatment.         All questions were answered. The patient knows to call the clinic with any problems, questions or concerns. We can certainly see the patient much sooner if necessary.  A total of (30) minutes of face-to-face time was spent with this patient with greater than 50% of that time in counseling and care-coordination.  This note was electronically signed. Scot Dock, NP 07/16/2017

## 2017-07-16 NOTE — Patient Instructions (Signed)
[facility] Discharge Instructions for Patients Receiving Chemotherapy  Today you received the following chemotherapy agents:  Taxol.  To help prevent nausea and vomiting after your treatment, we encourage you to take your nausea medication as directed.   If you develop nausea and vomiting that is not controlled by your nausea medication, call the clinic.   BELOW ARE SYMPTOMS THAT SHOULD BE REPORTED IMMEDIATELY:  *FEVER GREATER THAN 100.5 F  *CHILLS WITH OR WITHOUT FEVER  NAUSEA AND VOMITING THAT IS NOT CONTROLLED WITH YOUR NAUSEA MEDICATION  *UNUSUAL SHORTNESS OF BREATH  *UNUSUAL BRUISING OR BLEEDING  TENDERNESS IN MOUTH AND THROAT WITH OR WITHOUT PRESENCE OF ULCERS  *URINARY PROBLEMS  *BOWEL PROBLEMS  UNUSUAL RASH Items with * indicate a potential emergency and should be followed up as soon as possible.  Feel free to call the clinic should you have any questions or concerns. The clinic phone number is [phone].  Please show the CHEMO ALERT CARD at check-in to the Emergency Department and triage nurse.   

## 2017-07-19 ENCOUNTER — Other Ambulatory Visit: Payer: Self-pay | Admitting: Hematology and Oncology

## 2017-07-19 DIAGNOSIS — Z170 Estrogen receptor positive status [ER+]: Secondary | ICD-10-CM

## 2017-07-19 DIAGNOSIS — C50411 Malignant neoplasm of upper-outer quadrant of right female breast: Secondary | ICD-10-CM

## 2017-07-20 ENCOUNTER — Emergency Department (HOSPITAL_COMMUNITY): Admission: EM | Admit: 2017-07-20 | Payer: BLUE CROSS/BLUE SHIELD | Source: Home / Self Care

## 2017-07-20 ENCOUNTER — Encounter (HOSPITAL_COMMUNITY): Payer: Self-pay | Admitting: Emergency Medicine

## 2017-07-20 ENCOUNTER — Emergency Department (HOSPITAL_COMMUNITY): Payer: BLUE CROSS/BLUE SHIELD

## 2017-07-20 ENCOUNTER — Emergency Department (HOSPITAL_COMMUNITY)
Admission: EM | Admit: 2017-07-20 | Discharge: 2017-07-20 | Disposition: A | Discharge status: Home / Self Care | Payer: BLUE CROSS/BLUE SHIELD | Attending: Emergency Medicine | Admitting: Emergency Medicine

## 2017-07-20 DIAGNOSIS — R202 Paresthesia of skin: Secondary | ICD-10-CM | POA: Insufficient documentation

## 2017-07-20 DIAGNOSIS — Z170 Estrogen receptor positive status [ER+]: Secondary | ICD-10-CM | POA: Diagnosis not present

## 2017-07-20 DIAGNOSIS — Z79899 Other long term (current) drug therapy: Secondary | ICD-10-CM | POA: Insufficient documentation

## 2017-07-20 DIAGNOSIS — M79601 Pain in right arm: Secondary | ICD-10-CM | POA: Diagnosis not present

## 2017-07-20 DIAGNOSIS — C50411 Malignant neoplasm of upper-outer quadrant of right female breast: Secondary | ICD-10-CM | POA: Insufficient documentation

## 2017-07-20 DIAGNOSIS — R200 Anesthesia of skin: Secondary | ICD-10-CM

## 2017-07-20 DIAGNOSIS — F329 Major depressive disorder, single episode, unspecified: Secondary | ICD-10-CM | POA: Insufficient documentation

## 2017-07-20 DIAGNOSIS — Z87891 Personal history of nicotine dependence: Secondary | ICD-10-CM | POA: Diagnosis not present

## 2017-07-20 DIAGNOSIS — M79603 Pain in arm, unspecified: Secondary | ICD-10-CM

## 2017-07-20 NOTE — ED Triage Notes (Signed)
Patient presents reporting right arm/hand numbness pain and tingling onset of 2am last night. Pt states bilateral arm tingling last night but has been persistent in the right arm. Strong bilateral grips with same sensation. Pt has steady gait. Pt adds she felt nausea and took antiemetic and has relief. Last chemo treatment on Wednesday.

## 2017-07-20 NOTE — ED Provider Notes (Signed)
[facility] DEPT Provider Note  CSN: [id number] Arrival date & time: 07/20/17 6789  Chief Complaint(s) Numbness (Right Arm)  HPI Rebecca [name] is a 61 y.o. female with a history of grade 2 breast cancer status post lumpectomy currently undergoing IV chemotherapy who presents with bilateral upper extremity tingling and right arm pain.  She reports that this began around 2:00 this morning and has been constant since onset.  The tingling in the left arm has resolved.  Still has persistent tingling of the right arm.  Patient has had similar presentations in the past with upper back muscle spasms and reports this feels the same.  She denies any weakness, any lightheadedness, visual disturbance, difficulty swallowing or talking, focal weakness, difficulty ambulating.  Patient reports noticing mild swelling of the hand that has been ongoing since her surgery.  Patient reports that she has had right arm pain for several months even prior to the diagnosis of the cancer.  Her pain is exacerbated with palpation of the right deltoid.  No alleviating factors.  Denies any other associated symptoms or physical complaints.  HPI  Past Medical History Past Medical History:  Diagnosis Date  . Depression    REMISSION/ SITUATIONAL  . Dyspnea   . Family history of brain cancer   . Family history of breast cancer   . Family history of colon cancer   . Family history of melanoma   . Hepatitis AGE 1   RESOLVED  . Hyperlipidemia   . Insomnia   . Migraines    HORMONAL  . Shingles outbreak 2003, 2015   RIGHT HIP, and between breasts 3 years ago   Patient Active Problem List   Diagnosis Date Noted  . Port catheter in place 04/23/2017  . Genetic testing 03/10/2017  . Carcinoma of upper-outer quadrant of right breast in female, estrogen receptor positive ([place]) 03/05/2017  . 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 Family history of breast cancer   . Family history of  melanoma   . Family history of colon cancer   . Family history of brain cancer   . Routine general medical examination at a health care facility 03/20/2016  . Migraine 08/24/2015  . Hyperlipidemia 07/02/2013  . Vitamin D deficiency 07/02/2013   Home Medication(s) Prior to Admission medications   Medication Sig Start Date End Date Taking? Authorizing Provider  ibuprofen (ADVIL,MOTRIN) 200 MG tablet Take 200 mg by mouth every 6 (six) hours as needed for mild pain.   Yes [provider]  lidocaine-prilocaine (EMLA) cream Apply to affected area once 04/03/17  Yes Nicholas Lose, MD  LORazepam (ATIVAN) 0.5 MG tablet Take 1 tablet (0.5 mg total) by mouth at bedtime. As needed 07/16/17  Yes Causey, Charlestine Massed, NP  prochlorperazine (COMPAZINE) 10 MG tablet Take 1 tablet (10 mg total) by mouth every 6 (six) hours as needed (Nausea or vomiting). 04/03/17  Yes Nicholas Lose, MD  ondansetron (ZOFRAN) 8 MG tablet Take 1 tablet (8 mg total) by mouth 2 (two) times daily as needed. Start on the third day after chemotherapy. Patient not taking: Reported on 07/20/2017 04/03/17   Nicholas Lose, MD  Past Surgical History Past Surgical History:  Procedure Laterality Date  . BREAST LUMPECTOMY WITH RADIOACTIVE SEED AND SENTINEL LYMPH NODE BIOPSY Right 03/19/2017   Procedure: RIGHT BREAST LUMPECTOMY WITH RADIOACTIVE SEED AND SENTINEL LYMPH NODE BIOPSY;  Surgeon: Excell Seltzer, MD;  Location: [facility];  Service: General;  Laterality: Right;  . KNEE ARTHROSCOPY Right 2002  . TUBAL LIGATION    . uterine polyps  2016   Family History Family History  Problem Relation Age of Onset  . Depression Brother   . Lung cancer Brother 74       maternal half brother  . Dementia Mother        Delman Kitten  . Cancer Father 7       MELANOMA CAUSED DEATH/melanoma  .  Cancer Brother        multiple myeloma/lung - maternal half brother  . Stroke Maternal Grandmother 80  . Cancer Sister 1       breast- deceased at 43 - 1/2 sister  . Breast cancer Sister   . Heart disease Maternal Grandfather   . Cancer Paternal Grandfather        COLON  . Cervical cancer Maternal Aunt   . Lung cancer Maternal Uncle   . Other Paternal Grandmother        died in childbirth  . Dementia Maternal Aunt     Social History Social History   Tobacco Use  . Smoking status: Former Smoker    Last attempt to quit: 08/26/2008    Years since quitting: 8.9  . Smokeless tobacco: Never Used  Substance Use Topics  . Alcohol use: No    Comment: OCCASIONAL  . Drug use: No   Allergies Citalopram; Citalopram; Dexamethasone; Dexamethasone; Dilaudid [hydromorphone hcl]; Propoxycaine; Red yeast rice [cholestin]; Rofecoxib; and Sulfamethoxazole  Review of Systems Review of Systems All other systems are reviewed and are negative for acute change except as noted in the HPI  Physical Exam Vital Signs  I have reviewed the triage vital signs BP 131/77 (BP Location: Left Arm)   Pulse 72   Temp 98.3 F (36.8 C) (Oral)   Resp 17   Ht '5\' 6"'$  (1.676 m)   Wt 78 kg (172 lb)   LMP 08/03/2013   SpO2 100%   BMI 27.76 kg/m   Physical Exam  Constitutional: She is oriented to person, place, and time. She appears well-developed and well-nourished. No distress.  HENT:  Head: Normocephalic and atraumatic.  Nose: Nose normal.  Eyes: Conjunctivae and EOM are normal. Pupils are equal, round, and reactive to light. Right eye exhibits no discharge. Left eye exhibits no discharge. No scleral icterus.  Neck: Normal range of motion. Neck supple.  Cardiovascular: Normal rate and regular rhythm. Exam reveals no gallop and no friction rub.  No murmur heard. Pulmonary/Chest: Effort normal and breath sounds normal. No stridor. No respiratory distress. She has no rales.  Abdominal: Soft. She exhibits  no distension. There is no tenderness.  Musculoskeletal: She exhibits no edema.       Cervical back: She exhibits tenderness and spasm. She exhibits no bony tenderness.       Back:       Right upper arm: She exhibits tenderness. She exhibits no swelling and no edema.       Arms: Neurological: She is alert and oriented to person, place, and time.  Mental Status:  Alert and oriented to person, place, and time.  Attention and concentration normal.  Speech clear.  Recent  memory is intact  Cranial Nerves:  II Visual Fields: Intact to confrontation. Visual fields intact. III, IV, VI: Pupils equal and reactive to light and near. Full eye movement without nystagmus  V Facial Sensation: Normal. No weakness of masticatory muscles  VII: No facial weakness or asymmetry  VIII Auditory Acuity: Grossly normal  IX/X: The uvula is midline; the palate elevates symmetrically  XI: Normal sternocleidomastoid and trapezius strength  XII: The tongue is midline. No atrophy or fasciculations.   Motor System: Muscle Strength: 5/5 and symmetric in the upper and lower extremities. No pronation or drift.  Muscle Tone: Tone and muscle bulk are normal in the upper and lower extremities.   Reflexes: DTRs: 1+ and symmetrical in all four extremities. No Clonus Coordination: Intact finger-to-nose, heel-to-shin. No tremor.  Sensation: Intact to light touch, and pinprick. Negative Romberg test.  Gait: Routine gait normal.   Skin: Skin is warm and dry. No rash noted. She is not diaphoretic. No erythema.  Psychiatric: She has a normal mood and affect.  Vitals reviewed.   ED Results and Treatments Labs (all labs ordered are listed, but only abnormal results are displayed) Labs Reviewed - No data to display                                                                                                                       EKG  EKG Interpretation  Date/Time:    Ventricular Rate:    PR Interval:    QRS  Duration:   QT Interval:    QTC Calculation:   R Axis:     Text Interpretation:        Radiology Dg Humerus Right  Result Date: 07/20/2017 CLINICAL DATA:  61 year old female with right arm/hand numbness, pain and tingling for several hours. EXAM: RIGHT HUMERUS - 2+ VIEW COMPARISON:  None. FINDINGS: There is no evidence of fracture or other focal bone lesions. Soft tissues are unremarkable. IMPRESSION: Negative. Electronically Signed   By: Kristopher Oppenheim M.D.   On: 07/20/2017 10:40   Pertinent labs & imaging results that were available during my care of the patient were reviewed by me and considered in my medical decision making (see chart for details).  Medications Ordered in ED Medications - No data to display  Procedures Procedures  (including critical care time)  Medical Decision Making / ED Course I have reviewed the nursing notes for this encounter and the patient's prior records (if available in EHR or on provided paperwork).    Presentation is most consistent with likely thoracic outlet syndrome from right upper back muscle spasm.  However given her history of breast cancer, plain film was obtained of the right humerus to rule out bony lesions, which was negative.  Also considered upper extremity DVT but was unable to obtain an ultrasound due to timing here in the emergency department.  I have low suspicion for DVT though she does have a history of cancer.  Will arrange for ultrasound in the morning at [facility] and will withhold anticoagulation for the time being.  On exam there were no focal neurologic deficits concerning for CVA or metastatic disease to the brain.  Discussed the possibility for obtaining CT of the brain however with shared decision making patient declined.  The patient is safe for discharge with strict return precautions.   Final  Clinical Impression(s) / ED Diagnoses Final diagnoses:  Arm pain  Right arm pain  Numbness and tingling of right arm    Disposition: Discharge  Condition: Good  I have discussed the results, Dx and Tx plan with the patient who expressed understanding and agree(s) with the plan. Discharge instructions discussed at great length. The patient was given strict return precautions who verbalized understanding of the instructions. No further questions at time of discharge.    ED Discharge Orders        Ordered    LE VENOUS     07/20/17 1322       Follow Up: [name], MD [address] [place] [address] [phone]  Call  As needed     This chart was dictated using voice recognition software.  Despite best efforts to proofread,  errors can occur which can change the documentation meaning.   Fatima Blank, MD 07/20/17 1332

## 2017-07-21 ENCOUNTER — Telehealth: Payer: Self-pay

## 2017-07-21 ENCOUNTER — Ambulatory Visit (HOSPITAL_COMMUNITY)
Admission: RE | Admit: 2017-07-21 | Discharge: 2017-07-21 | Disposition: A | Discharge status: Home / Self Care | Payer: BLUE CROSS/BLUE SHIELD | Source: Ambulatory Visit | Attending: Emergency Medicine | Admitting: Emergency Medicine

## 2017-07-21 ENCOUNTER — Other Ambulatory Visit: Payer: Self-pay

## 2017-07-21 ENCOUNTER — Other Ambulatory Visit (HOSPITAL_COMMUNITY): Payer: Self-pay | Admitting: Emergency Medicine

## 2017-07-21 DIAGNOSIS — M79601 Pain in right arm: Secondary | ICD-10-CM | POA: Diagnosis present

## 2017-07-21 DIAGNOSIS — R52 Pain, unspecified: Secondary | ICD-10-CM

## 2017-07-21 DIAGNOSIS — C50411 Malignant neoplasm of upper-outer quadrant of right female breast: Secondary | ICD-10-CM

## 2017-07-21 DIAGNOSIS — Z170 Estrogen receptor positive status [ER+]: Secondary | ICD-10-CM

## 2017-07-21 MED ORDER — LORAZEPAM 0.5 MG PO TABS: 0.5000 mg | ORAL_TABLET | Freq: Every day | ORAL | 1 refills | Status: DC

## 2017-07-21 NOTE — Telephone Encounter (Signed)
F/u on pt. Went to the ED yesterday for arm pain. Md aware. Will follow up. Pt stated her ativan had not been sent in to pharmacy. Reprinted and will fax today.  Cyndia Bent RN

## 2017-07-21 NOTE — Telephone Encounter (Signed)
Left pt message informing her she has an appt to see Dr. Lindi Adie on Wednesday prior to her infusion.  Cyndia Bent RN

## 2017-07-21 NOTE — Progress Notes (Signed)
VASCULAR LAB PRELIMINARY  PRELIMINARY  PRELIMINARY  PRELIMINARY  Right upper extremity venous duplex completed.    Preliminary report:  There is no DVT or SVT noted in the right upper extremity.   Tijuana Scheidegger, RVT 07/21/2017, 8:47 AM

## 2017-07-23 ENCOUNTER — Other Ambulatory Visit (HOSPITAL_BASED_OUTPATIENT_CLINIC_OR_DEPARTMENT_OTHER): Payer: BLUE CROSS/BLUE SHIELD

## 2017-07-23 ENCOUNTER — Ambulatory Visit (HOSPITAL_BASED_OUTPATIENT_CLINIC_OR_DEPARTMENT_OTHER): Payer: BLUE CROSS/BLUE SHIELD

## 2017-07-23 ENCOUNTER — Ambulatory Visit: Payer: BLUE CROSS/BLUE SHIELD

## 2017-07-23 ENCOUNTER — Ambulatory Visit (HOSPITAL_BASED_OUTPATIENT_CLINIC_OR_DEPARTMENT_OTHER): Payer: BLUE CROSS/BLUE SHIELD | Admitting: Hematology and Oncology

## 2017-07-23 VITALS — BP 149/70 | HR 79 | Temp 98.1°F | Resp 18 | Ht 66.0 in | Wt 173.6 lb

## 2017-07-23 DIAGNOSIS — Z95828 Presence of other vascular implants and grafts: Secondary | ICD-10-CM

## 2017-07-23 DIAGNOSIS — Z5111 Encounter for antineoplastic chemotherapy: Secondary | ICD-10-CM | POA: Diagnosis not present

## 2017-07-23 DIAGNOSIS — Z1589 Genetic susceptibility to other disease: Principal | ICD-10-CM

## 2017-07-23 DIAGNOSIS — Z170 Estrogen receptor positive status [ER+]: Secondary | ICD-10-CM | POA: Diagnosis not present

## 2017-07-23 DIAGNOSIS — C50919 Malignant neoplasm of unspecified site of unspecified female breast: Secondary | ICD-10-CM

## 2017-07-23 DIAGNOSIS — C50411 Malignant neoplasm of upper-outer quadrant of right female breast: Secondary | ICD-10-CM

## 2017-07-23 DIAGNOSIS — Z1509 Genetic susceptibility to other malignant neoplasm: Principal | ICD-10-CM

## 2017-07-23 DIAGNOSIS — G620 Drug-induced polyneuropathy: Secondary | ICD-10-CM | POA: Diagnosis not present

## 2017-07-23 DIAGNOSIS — Z1502 Genetic susceptibility to malignant neoplasm of ovary: Principal | ICD-10-CM

## 2017-07-23 LAB — COMPREHENSIVE METABOLIC PANEL
ALBUMIN: 3.7 g/dL (ref 3.5–5.0)
ALK PHOS: 56 U/L (ref 40–150)
ALT: 71 U/L — AB (ref 0–55)
AST: 45 U/L — AB (ref 5–34)
Anion Gap: 8 mEq/L (ref 3–11)
BILIRUBIN TOTAL: 0.75 mg/dL (ref 0.20–1.20)
BUN: 15.3 mg/dL (ref 7.0–26.0)
CO2: 24 mEq/L (ref 22–29)
CREATININE: 0.7 mg/dL (ref 0.6–1.1)
Calcium: 8.8 mg/dL (ref 8.4–10.4)
Chloride: 108 mEq/L (ref 98–109)
EGFR: >60 mL/min/{1.73_m2} (ref >60)
GLUCOSE: 86 mg/dL (ref 70–140)
Potassium: 3.7 mEq/L (ref 3.5–5.1)
SODIUM: 140 meq/L (ref 136–145)
TOTAL PROTEIN: 6.4 g/dL (ref 6.4–8.3)

## 2017-07-23 LAB — CBC WITH DIFFERENTIAL/PLATELET
BASO%: 1.4 % (ref 0.0–2.0)
Basophils Absolute: 0.1 10*3/uL (ref 0.0–0.1)
EOS%: 1.2 % (ref 0.0–7.0)
Eosinophils Absolute: 0 10*3/uL (ref 0.0–0.5)
HCT: 31 % — ABNORMAL LOW (ref 34.8–46.6)
HGB: 10.6 g/dL — ABNORMAL LOW (ref 11.6–15.9)
LYMPH%: 18.6 % (ref 14.0–49.7)
MCH: 32.8 pg (ref 25.1–34.0)
MCHC: 34.2 g/dL (ref 31.5–36.0)
MCV: 95.9 fL (ref 79.5–101.0)
MONO#: 0.3 10*3/uL (ref 0.1–0.9)
MONO%: 9.3 % (ref 0.0–14.0)
NEUT#: 2.5 10*3/uL (ref 1.5–6.5)
NEUT%: 69.5 % (ref 38.4–76.8)
Platelets: 241 10*3/uL (ref 145–400)
RBC: 3.24 10*6/uL — AB (ref 3.70–5.45)
RDW: 15.8 % — ABNORMAL HIGH (ref 11.2–14.5)
WBC: 3.6 10*3/uL — ABNORMAL LOW (ref 3.9–10.3)
lymph#: 0.7 10*3/uL — ABNORMAL LOW (ref 0.9–3.3)

## 2017-07-23 MED ORDER — HEPARIN SOD (PORK) LOCK FLUSH 100 UNIT/ML IV SOLN
500.0000 [IU] | Freq: Once | INTRAVENOUS | Status: AC | PRN
  Administered 2017-07-23: 500 [IU]
  Filled 2017-07-23: qty 5

## 2017-07-23 MED ORDER — SODIUM CHLORIDE 0.9% FLUSH
10.0000 mL | Freq: Once | INTRAVENOUS | Status: AC
  Administered 2017-07-23: 10 mL
  Filled 2017-07-23: qty 10

## 2017-07-23 MED ORDER — DIPHENHYDRAMINE HCL 50 MG/ML IJ SOLN
25.0000 mg | Freq: Once | INTRAMUSCULAR | Status: AC
  Administered 2017-07-23: 25 mg via INTRAVENOUS

## 2017-07-23 MED ORDER — SODIUM CHLORIDE 0.9% FLUSH
10.0000 mL | INTRAVENOUS | Status: DC | PRN
  Administered 2017-07-23: 10 mL
  Filled 2017-07-23: qty 10

## 2017-07-23 MED ORDER — DIPHENHYDRAMINE HCL 50 MG/ML IJ SOLN
INTRAMUSCULAR | Status: AC
Start: 2017-07-23 — End: ?
  Filled 2017-07-23: qty 1

## 2017-07-23 MED ORDER — FAMOTIDINE IN NACL 20-0.9 MG/50ML-% IV SOLN
20.0000 mg | Freq: Once | INTRAVENOUS | Status: AC
  Administered 2017-07-23: 20 mg via INTRAVENOUS

## 2017-07-23 MED ORDER — SODIUM CHLORIDE 0.9 % IV SOLN
60.0000 mg/m2 | Freq: Once | INTRAVENOUS | Status: AC
  Administered 2017-07-23: 114 mg via INTRAVENOUS
  Filled 2017-07-23: qty 19

## 2017-07-23 MED ORDER — SODIUM CHLORIDE 0.9 % IV SOLN
Freq: Once | INTRAVENOUS | Status: AC
  Administered 2017-07-23: 09:00:00 via INTRAVENOUS

## 2017-07-23 MED ORDER — FAMOTIDINE IN NACL 20-0.9 MG/50ML-% IV SOLN
INTRAVENOUS | Status: AC
  Filled 2017-07-23: qty 50

## 2017-07-23 NOTE — Assessment & Plan Note (Signed)
03/19/2017: Right lumpectomy: IDC with DCIS, 1.2 cm, grade 2, margins negative, 0/2 lymph nodes negative, ER 100%, PR 10%, HER-2 negative ratio 1.73, Ki-67 40% T1BN0 stage IA CHEK 2 Mutation Positive Oncotype Dx score: 41 (28% ROR with Tamoxifen alone) Treatment plan: 1. Adjuvant chemotherapy with dose dense Adriamycin Cytoxan 4 followed by weekly Taxol 12 2. followed by adjuvant radiation 3. Followed by adjuvant antiestrogen therapy ---------------------------------------------------------------------------- Current treatment: Cycle 7 of weekly Taxol Toxicities: 1.  Darkening of the nailbeds 2. Difficulty Sleeping: Recommended she walk daily and continue Lorazepam and Melatonin 3. Facial rash: ? Staph related.  May benefit from doxycycline if happens again.   4.  Chemotherapy-induced neuropathy:   Follow-up weekly for chemo related toxicity management

## 2017-07-23 NOTE — Patient Instructions (Signed)
[facility] Discharge Instructions for Patients Receiving Chemotherapy  Today you received the following chemotherapy agents:  Taxol.  To help prevent nausea and vomiting after your treatment, we encourage you to take your nausea medication as directed.   If you develop nausea and vomiting that is not controlled by your nausea medication, call the clinic.   BELOW ARE SYMPTOMS THAT SHOULD BE REPORTED IMMEDIATELY:  *FEVER GREATER THAN 100.5 F  *CHILLS WITH OR WITHOUT FEVER  NAUSEA AND VOMITING THAT IS NOT CONTROLLED WITH YOUR NAUSEA MEDICATION  *UNUSUAL SHORTNESS OF BREATH  *UNUSUAL BRUISING OR BLEEDING  TENDERNESS IN MOUTH AND THROAT WITH OR WITHOUT PRESENCE OF ULCERS  *URINARY PROBLEMS  *BOWEL PROBLEMS  UNUSUAL RASH Items with * indicate a potential emergency and should be followed up as soon as possible.  Feel free to call the clinic should you have any questions or concerns. The clinic phone number is [phone].  Please show the CHEMO ALERT CARD at check-in to the Emergency Department and triage nurse.   

## 2017-07-23 NOTE — Progress Notes (Signed)
Patient Care Team: [name], MD as PCP - General (Internal Medicine) [name], MD (Internal Medicine) Arta Silence, MD as Consulting Physician (Gastroenterology) Arvella Nigh, MD as Consulting Physician (Obstetrics and Gynecology)  DIAGNOSIS:  Encounter Diagnoses  Name Primary?  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  The STAT Breast cancer panel offered by [facility] includes sequencing and rearrangement analysis for the following 9 genes:  ATM, BRCA1, BRCA2, CDH1, CHEK2, PALB2, PTEN, STK11 and TP53.   The report date is March 09, 2017.  Re-requitisioned to the common hereditary cancer panel and the melanoma panel.  The additional genes tested were negative.  The Hereditary Gene Panel offered by [facility] includes sequencing and/or deletion duplication testing of the following 46 genes: APC, ATM, AXIN2, BARD1, BMPR1A, BRCA1, BRCA2, BRIP1, CDH1, CDKN2A (p14ARF), CDKN2A (p16INK4a), CHEK2, CTNNA1, DICER1, EPCAM (Deletion/duplication testing only), GREM1 (promoter region deletion/duplication testing only), KIT, MEN1, MLH1, MSH2, MSH3, MSH6, MUTYH, NBN, NF1, NHTL1, PALB2, PDGFRA, PMS2, POLD1, POLE, PTEN, RAD50, RAD51C, RAD51D, SDHB, SDHC, SDHD, SMAD4, SMARCA4. STK11,  TP53, TSC1, TSC2, and VHL.  The following genes were evaluated for sequence changes only: SDHA and HOXB13 c.251G>A variant only.  The Melanoma panel offered by [facility] includes sequencing and/or deletion duplication testing of the following 12 genes: BAP1, BRCA1, BRCA2, BRIP1, CDK4, CDKN2A (p14ARF), CDKN2A (p16INK4a), MC1R, POT1, PTEN, RB1, TERT, and TP53.  The following gene was evaluated for sequence changes only: MITF (c.952G>A, p.GLU318Lys variant only). The updated report date is March 14, 2017.        03/19/2017 Surgery    Right lumpectomy: IDC with DCIS, 1.2 cm, grade 2, margins negative, 0/2 lymph nodes negative, ER 100%, PR 10%, HER-2 negative ratio 1.73, Ki-67 40% T1BN0 stage IA      04/01/2017 Oncotype testing    Oncotype Dx score: 41 (28% ROR with Tamoxifen alone)      04/16/2017 -  Chemotherapy    Dose dense Adriamycin and Cytoxan 4 followed by Taxol weekly 12        CHIEF COMPLIANT: Right arm pain, tingling of both hands, bilateral leg pains after last cycle of chemo, emergency room visit  INTERVAL HISTORY: Janiene Aarons is a 61 year old who is currently on adjuvant chemo with Taxol.  Today is cycle 7.  After last cycle of chemo she had tingling in the fingers as well as upper thigh pains and she went to the emergency room x-rays did not reveal any abnormalities ultrasound of the arm did not show any blood clots.  She took Advil and her symptoms improved.  She also had some chiropractic treatments that have also eased off the tingling.  She had mild nausea for which she took antinausea medication.  REVIEW OF SYSTEMS:  Constitutional: Denies fevers, chills or abnormal weight loss Eyes: Denies blurriness of vision Ears, nose, mouth, throat, and face: Denies mucositis or sore throat Respiratory: Denies cough, dyspnea or wheezes Cardiovascular: Denies palpitation, chest discomfort Gastrointestinal:  Denies nausea, heartburn or change in bowel habits Skin: Denies abnormal skin  rashes Lymphatics: Denies new lymphadenopathy or easy bruising Neurological: Tingling in the fingers and muscle aches, right upper extremity pain Behavioral/Psych: Mood is stable, no new changes  Extremities: No lower extremity edema All other systems were reviewed with the patient and are negative.  I have reviewed the past medical history, past surgical history, social history and family history with the patient and they are unchanged from previous note.  ALLERGIES:  is allergic to citalopram; citalopram; dexamethasone; dexamethasone; dilaudid [hydromorphone hcl]; propoxycaine; red yeast rice [cholestin]; rofecoxib; and sulfamethoxazole.  MEDICATIONS:  Current Outpatient Medications  Medication Sig Dispense Refill  . ibuprofen (ADVIL,MOTRIN) 200 MG tablet Take 200 mg by mouth every 6 (six) hours as needed for mild pain.    Marland Kitchen lidocaine-prilocaine (EMLA) cream Apply to affected area once 30 g 3  . LORazepam (ATIVAN) 0.5 MG tablet Take 1 tablet (0.5 mg total) by mouth at bedtime. As needed 30 tablet 1  . ondansetron (ZOFRAN) 8 MG tablet Take 1 tablet (8 mg total) by mouth 2 (two) times daily as needed. Start on the third day after chemotherapy. (Patient not taking: Reported on 07/20/2017) 30 tablet 1  . prochlorperazine (COMPAZINE) 10 MG tablet Take 1 tablet (10 mg total) by mouth every 6 (six) hours as needed (Nausea or vomiting). 30 tablet 1   No current facility-administered medications for this visit.     PHYSICAL EXAMINATION: ECOG PERFORMANCE STATUS: 1 - Symptomatic but completely ambulatory  Vitals:   07/23/17 0820  BP: (!) 149/70  Pulse: 79  Resp: 18  Temp: 98.1 F (36.7 C)  SpO2: 98%   Filed Weights   07/23/17 0820  Weight: 173 lb 9.6 oz (78.7 kg)    GENERAL:alert, no distress and comfortable SKIN: skin color, texture, turgor are normal, no rashes or significant lesions EYES: normal, Conjunctiva are pink and non-injected, sclera clear OROPHARYNX:no exudate, no  erythema and lips, buccal mucosa, and tongue normal  NECK: supple, thyroid normal size, non-tender, without nodularity LYMPH:  no palpable lymphadenopathy in the cervical, axillary or inguinal LUNGS: clear to auscultation and percussion with normal breathing effort HEART: regular rate & rhythm and no murmurs and no lower extremity edema ABDOMEN:abdomen soft, non-tender and normal bowel sounds MUSCULOSKELETAL:no cyanosis of digits and no clubbing  NEURO: alert & oriented x 3 with fluent speech, no focal motor/sensory deficits EXTREMITIES: No lower extremity edema  LABORATORY DATA:  I have reviewed the data as listed   Chemistry      Component Value Date/Time   NA 138 07/16/2017 0805   K 4.1 07/16/2017 0805   CL 105 04/07/2017 0931   CO2 25 07/16/2017 0805   BUN 15.4 07/16/2017 0805   CREATININE 0.7 07/16/2017 0805      Component Value Date/Time   CALCIUM 9.1 07/16/2017 0805   ALKPHOS 59 07/16/2017 0805   AST 36 (H) 07/16/2017 0805   ALT 69 (H) 07/16/2017 0805   BILITOT 0.78 07/16/2017 0805       Lab Results  Component Value Date   WBC 3.6 (L) 07/23/2017   HGB 10.6 (L) 07/23/2017   HCT 31.0 (L) 07/23/2017   MCV 95.9 07/23/2017   PLT 241 07/23/2017   NEUTROABS 2.5 07/23/2017  ASSESSMENT & PLAN:  Carcinoma of upper-outer quadrant of right breast in female, estrogen receptor positive ([place]) 03/19/2017: Right lumpectomy: IDC with DCIS, 1.2 cm, grade 2, margins negative, 0/2 lymph nodes negative, ER 100%, PR 10%, HER-2 negative ratio 1.73, Ki-67 40% T1BN0 stage IA CHEK 2 Mutation Positive Oncotype Dx score: 41 (28% ROR with Tamoxifen alone) Treatment plan: 1. Adjuvant chemotherapy with dose dense Adriamycin Cytoxan 4 followed by weekly Taxol 12 2. followed by adjuvant radiation 3. Followed by adjuvant antiestrogen therapy ---------------------------------------------------------------------------- Current treatment: Cycle 7 of weekly Taxol Toxicities: 1.  Darkening  of the nailbeds 2. Difficulty Sleeping: Recommended she walk daily and continue Lorazepam and Melatonin 3. Facial rash: ? Staph related.  May benefit from doxycycline if happens again.   4.  Chemotherapy-induced neuropathy: I decreased the dosage of Taxol with cycle 7. If her symptoms continue to relapse, we may have to discuss further reducing chemo or stopping it completely. 5. Right upper extremity pain.  In the right humerus: We discussed about getting a PET scan after the conclusion of her chemo.  Follow-up weekly for chemo related toxicity management  I spent 25 minutes talking to the patient of which more than half was spent in counseling and coordination of care.  No orders of the defined types were placed in this encounter.  The patient has a good understanding of the overall plan. she agrees with it. she will call with any problems that may develop before the next visit here.   Rulon Eisenmenger, MD 07/23/17

## 2017-07-30 ENCOUNTER — Ambulatory Visit: Payer: BLUE CROSS/BLUE SHIELD

## 2017-07-30 ENCOUNTER — Ambulatory Visit (HOSPITAL_BASED_OUTPATIENT_CLINIC_OR_DEPARTMENT_OTHER): Payer: BLUE CROSS/BLUE SHIELD

## 2017-07-30 ENCOUNTER — Ambulatory Visit (HOSPITAL_BASED_OUTPATIENT_CLINIC_OR_DEPARTMENT_OTHER): Payer: BLUE CROSS/BLUE SHIELD | Admitting: Adult Health

## 2017-07-30 ENCOUNTER — Other Ambulatory Visit (HOSPITAL_BASED_OUTPATIENT_CLINIC_OR_DEPARTMENT_OTHER): Payer: BLUE CROSS/BLUE SHIELD

## 2017-07-30 ENCOUNTER — Encounter: Payer: Self-pay | Admitting: Adult Health

## 2017-07-30 VITALS — BP 132/79 | HR 77 | Temp 98.3°F | Resp 18 | Ht 66.0 in | Wt 173.0 lb

## 2017-07-30 DIAGNOSIS — C50411 Malignant neoplasm of upper-outer quadrant of right female breast: Secondary | ICD-10-CM | POA: Diagnosis not present

## 2017-07-30 DIAGNOSIS — Z95828 Presence of other vascular implants and grafts: Secondary | ICD-10-CM

## 2017-07-30 DIAGNOSIS — Z170 Estrogen receptor positive status [ER+]: Secondary | ICD-10-CM

## 2017-07-30 DIAGNOSIS — G620 Drug-induced polyneuropathy: Secondary | ICD-10-CM

## 2017-07-30 DIAGNOSIS — Z5111 Encounter for antineoplastic chemotherapy: Secondary | ICD-10-CM | POA: Diagnosis not present

## 2017-07-30 LAB — CBC WITH DIFFERENTIAL/PLATELET
BASO%: 1 % (ref 0.0–2.0)
Basophils Absolute: 0 10*3/uL (ref 0.0–0.1)
EOS ABS: 0 10*3/uL (ref 0.0–0.5)
EOS%: 1 % (ref 0.0–7.0)
HCT: 33 % — ABNORMAL LOW (ref 34.8–46.6)
HEMOGLOBIN: 10.9 g/dL — AB (ref 11.6–15.9)
LYMPH%: 15.1 % (ref 14.0–49.7)
MCH: 32.2 pg (ref 25.1–34.0)
MCHC: 33 g/dL (ref 31.5–36.0)
MCV: 97.3 fL (ref 79.5–101.0)
MONO#: 0.5 10*3/uL (ref 0.1–0.9)
MONO%: 12.2 % (ref 0.0–14.0)
NEUT%: 70.7 % (ref 38.4–76.8)
NEUTROS ABS: 2.9 10*3/uL (ref 1.5–6.5)
Platelets: 252 10*3/uL (ref 145–400)
RBC: 3.39 10*6/uL — AB (ref 3.70–5.45)
RDW: 14.4 % (ref 11.2–14.5)
WBC: 4.1 10*3/uL (ref 3.9–10.3)
lymph#: 0.6 10*3/uL — ABNORMAL LOW (ref 0.9–3.3)

## 2017-07-30 LAB — COMPREHENSIVE METABOLIC PANEL
ALBUMIN: 3.7 g/dL (ref 3.5–5.0)
ALK PHOS: 60 U/L (ref 40–150)
ALT: 58 U/L — ABNORMAL HIGH (ref 0–55)
AST: 37 U/L — AB (ref 5–34)
Anion Gap: 8 mEq/L (ref 3–11)
BUN: 14.5 mg/dL (ref 7.0–26.0)
CO2: 24 meq/L (ref 22–29)
Calcium: 8.8 mg/dL (ref 8.4–10.4)
Chloride: 108 mEq/L (ref 98–109)
Creatinine: 0.7 mg/dL (ref 0.6–1.1)
GLUCOSE: 80 mg/dL (ref 70–140)
POTASSIUM: 4.1 meq/L (ref 3.5–5.1)
SODIUM: 140 meq/L (ref 136–145)
TOTAL PROTEIN: 6.5 g/dL (ref 6.4–8.3)
Total Bilirubin: 0.72 mg/dL (ref 0.20–1.20)

## 2017-07-30 MED ORDER — SODIUM CHLORIDE 0.9 % IV SOLN
60.0000 mg/m2 | Freq: Once | INTRAVENOUS | Status: AC
  Administered 2017-07-30: 114 mg via INTRAVENOUS
  Filled 2017-07-30: qty 19

## 2017-07-30 MED ORDER — SODIUM CHLORIDE 0.9% FLUSH
10.0000 mL | Freq: Once | INTRAVENOUS | Status: AC
  Administered 2017-07-30: 10 mL
  Filled 2017-07-30: qty 10

## 2017-07-30 MED ORDER — SODIUM CHLORIDE 0.9 % IV SOLN
Freq: Once | INTRAVENOUS | Status: AC
  Administered 2017-07-30: 09:00:00 via INTRAVENOUS

## 2017-07-30 MED ORDER — DIPHENHYDRAMINE HCL 50 MG/ML IJ SOLN
INTRAMUSCULAR | Status: AC
  Filled 2017-07-30: qty 1

## 2017-07-30 MED ORDER — SODIUM CHLORIDE 0.9% FLUSH
10.0000 mL | INTRAVENOUS | Status: DC | PRN
  Administered 2017-07-30: 10 mL
  Filled 2017-07-30: qty 10

## 2017-07-30 MED ORDER — HEPARIN SOD (PORK) LOCK FLUSH 100 UNIT/ML IV SOLN
500.0000 [IU] | Freq: Once | INTRAVENOUS | Status: AC | PRN
  Administered 2017-07-30: 500 [IU]
  Filled 2017-07-30: qty 5

## 2017-07-30 MED ORDER — FAMOTIDINE IN NACL 20-0.9 MG/50ML-% IV SOLN
20.0000 mg | Freq: Once | INTRAVENOUS | Status: AC
  Administered 2017-07-30: 20 mg via INTRAVENOUS

## 2017-07-30 MED ORDER — DIPHENHYDRAMINE HCL 50 MG/ML IJ SOLN
25.0000 mg | Freq: Once | INTRAMUSCULAR | Status: AC
  Administered 2017-07-30: 25 mg via INTRAVENOUS

## 2017-07-30 MED ORDER — FAMOTIDINE IN NACL 20-0.9 MG/50ML-% IV SOLN
INTRAVENOUS | Status: AC
  Filled 2017-07-30: qty 50

## 2017-07-30 NOTE — Progress Notes (Signed)
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  The STAT Breast [name] panel offered by [facility] includes sequencing and rearrangement analysis for the following 9 genes:  ATM, BRCA1, BRCA2, CDH1, CHEK2, PALB2, PTEN, STK11 and TP53.   The report date is March 09, 2017.  Re-requitisioned to the common hereditary [name] panel and the melanoma panel.  The additional genes tested were negative.  The Hereditary Gene Panel offered by [facility] includes sequencing and/or deletion duplication testing of the following 46 genes: APC, ATM, AXIN2, BARD1, BMPR1A, BRCA1, BRCA2, BRIP1, CDH1, CDKN2A (p14ARF), CDKN2A (p16INK4a), CHEK2, CTNNA1, DICER1, EPCAM (Deletion/duplication testing only), GREM1 (promoter region deletion/duplication testing only), KIT, MEN1, MLH1, MSH2, MSH3, MSH6, MUTYH, NBN, NF1, NHTL1, PALB2, PDGFRA, PMS2, POLD1, POLE, PTEN, RAD50, RAD51C, RAD51D, SDHB,  SDHC, SDHD, SMAD4, SMARCA4. STK11, TP53, TSC1, TSC2, and VHL.  The following genes were evaluated for sequence changes only: SDHA and HOXB13 c.251G>A variant only.  The Melanoma panel offered by [facility] includes sequencing and/or deletion duplication testing of the following 12 genes: BAP1, BRCA1, BRCA2, BRIP1, CDK4, CDKN2A (p14ARF), CDKN2A (p16INK4a), MC1R, POT1, PTEN, RB1, TERT, and TP53.  The following gene was evaluated for sequence changes only: MITF (c.952G>A, p.GLU318Lys variant only). The updated report date is March 14, 2017.        03/19/2017 Surgery    Right lumpectomy: IDC with DCIS, 1.2 cm, grade 2, margins negative, 0/2 lymph nodes negative, ER 100%, PR 10%, HER-2 negative ratio 1.73, Ki-67 40% T1BN0 stage IA      04/01/2017 Oncotype testing    Oncotype Dx score: 41 (28% ROR with Tamoxifen alone)      04/16/2017 -  Chemotherapy    Dose dense Adriamycin and Cytoxan 4 followed by Taxol weekly 12        CURRENT THERAPY: Taxol week 8  INTERVAL HISTORY: Rebecca [name] 61 y.o. female returns for evaluation prior to receiving Taxol.  She previously had some mild intermittent peripheral neuropathy that has since resolved.  Otherwise, she is doing well today and is without any questions or concerns.     Patient Active Problem List   Diagnosis Date Noted  . Port catheter in place 04/23/2017  . Genetic testing 03/10/2017  . Carcinoma of upper-outer quadrant of right breast in female, estrogen receptor positive ([place]) 03/05/2017  . 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 Family history of breast [name]   . Family history of  melanoma   . Family history of colon [name]   . Family history of brain [name]   . Routine general medical examination at a health care facility 03/20/2016  . Migraine 08/24/2015  . Hyperlipidemia 07/02/2013  . Vitamin D deficiency 07/02/2013    is allergic to citalopram; citalopram; dexamethasone; dexamethasone; dilaudid [hydromorphone hcl];  propoxycaine; red yeast rice [cholestin]; rofecoxib; and sulfamethoxazole.  MEDICAL HISTORY: Past Medical History:  Diagnosis Date  . Depression    REMISSION/ SITUATIONAL  . Dyspnea   . Family history of brain [name]   . Family history of breast [name]   . Family history of colon [name]   . Family history of melanoma   . Hepatitis AGE 3   RESOLVED  . Hyperlipidemia   . Insomnia   . Migraines    HORMONAL  . Shingles outbreak 2003, 2015   RIGHT HIP, and between breasts 3 years ago    SURGICAL HISTORY: Past Surgical History:  Procedure Laterality Date  . BREAST LUMPECTOMY WITH RADIOACTIVE SEED AND SENTINEL LYMPH NODE BIOPSY Right 03/19/2017   Procedure: RIGHT BREAST LUMPECTOMY WITH RADIOACTIVE SEED AND SENTINEL LYMPH NODE BIOPSY;  Surgeon: Excell Seltzer, MD;  Location: [facility];  Service: General;  Laterality: Right;  . KNEE ARTHROSCOPY Right 2002  . TUBAL LIGATION    . uterine polyps  2016    SOCIAL HISTORY: Social History   Socioeconomic History  . Marital status: Married    Spouse name: Not on file  . Number of children: Not on file  . Years of education: Not on file  . Highest education level: Not on file  Social Needs  . Financial resource strain: Not on file  . Food insecurity - worry: Not on file  . Food insecurity - inability: Not on file  . Transportation needs - medical: Not on file  . Transportation needs - non-medical: Not on file  Occupational History  . Not on file  Tobacco Use  . Smoking status: Former Smoker    Last attempt to quit: 08/26/2008    Years since quitting: 8.9  . Smokeless tobacco: Never Used  Substance and Sexual Activity  . Alcohol use: No    Comment: OCCASIONAL  . Drug use: No  . Sexual activity: Yes    Birth control/protection: None  Other Topics Concern  . Not on file  Social History Narrative   ** Merged History Encounter **        FAMILY HISTORY: Family History  Problem Relation Age of Onset  .  Depression Brother   . Lung [name] Brother 34       maternal half brother  . Dementia Mother        Delman Kitten  . [name] Father 68       MELANOMA CAUSED DEATH/melanoma  . [name] Brother        multiple myeloma/lung - maternal half brother  . Stroke Maternal Grandmother 80  . [name] Sister 35       breast- deceased at 79 - 1/2 sister  . Breast [name] Sister   . Heart disease Maternal Grandfather   . [name] Paternal Grandfather        COLON  . Cervical [name] Maternal Aunt   . Lung [name] Maternal Uncle   . Other Paternal Grandmother        died in childbirth  . Dementia Maternal Aunt     Review of Systems  Constitutional: Positive for fatigue. Negative for appetite change, chills, fever and  unexpected weight change.  HENT:   Negative for hearing loss and lump/mass.   Eyes: Negative for icterus.  Respiratory: Negative for chest tightness, cough and shortness of breath.   Cardiovascular: Negative for chest pain, leg swelling and palpitations.  Gastrointestinal: Negative for abdominal distention, constipation, diarrhea, nausea and vomiting.  Endocrine: Negative for hot flashes.  Skin: Negative for itching and rash.  Neurological: Negative for dizziness, extremity weakness, headaches and numbness.  Hematological: Negative for adenopathy. Does not bruise/bleed easily.  Psychiatric/Behavioral: Negative for depression. The patient is not nervous/anxious.       PHYSICAL EXAMINATION  ECOG PERFORMANCE STATUS: 1 - Symptomatic but completely ambulatory  Vitals:   07/30/17 0818  BP: 132/79  Pulse: 77  Resp: 18  Temp: 98.3 F (36.8 C)  SpO2: 98%    Physical Exam  Constitutional: She is oriented to person, place, and time and well-developed, well-nourished, and in no distress.  HENT:  Head: Normocephalic and atraumatic.  Mouth/Throat: Oropharynx is clear and moist. No oropharyngeal exudate.  Eyes: Pupils are equal, round, and reactive to light.  Eyes are slightly injected   Neck: Neck supple.  Cardiovascular: Normal rate, regular rhythm and normal heart sounds.  Pulmonary/Chest: Effort normal and breath sounds normal.  Abdominal: Soft. Bowel sounds are normal. She exhibits no distension and no mass. There is no tenderness. There is no rebound and no guarding.  Musculoskeletal: She exhibits no edema.  Lymphadenopathy:    She has no cervical adenopathy.  Neurological: She is alert and oriented to person, place, and time.  Skin: Skin is warm and dry.  Psychiatric: Mood and affect normal.    LABORATORY DATA:  CBC    Component Value Date/Time   WBC 4.1 07/30/2017 0746   WBC 4.9 04/07/2017 0931   RBC 3.39 (L) 07/30/2017 0746   RBC 4.50 04/07/2017 0931   HGB 10.9 (L) 07/30/2017 0746   HCT 33.0 (L) 07/30/2017 0746   PLT 252 07/30/2017 0746   MCV 97.3 07/30/2017 0746   MCH 32.2 07/30/2017 0746   MCH 30.4 04/07/2017 0931   MCHC 33.0 07/30/2017 0746   MCHC 33.7 04/07/2017 0931   RDW 14.4 07/30/2017 0746   LYMPHSABS 0.6 (L) 07/30/2017 0746   MONOABS 0.5 07/30/2017 0746   EOSABS 0.0 07/30/2017 0746   BASOSABS 0.0 07/30/2017 0746    CMP     Component Value Date/Time   NA 140 07/30/2017 0746   K 4.1 07/30/2017 0746   CL 105 04/07/2017 0931   CO2 24 07/30/2017 0746   GLUCOSE 80 07/30/2017 0746   BUN 14.5 07/30/2017 0746   CREATININE 0.7 07/30/2017 0746   CALCIUM 8.8 07/30/2017 0746   PROT 6.5 07/30/2017 0746   ALBUMIN 3.7 07/30/2017 0746   AST 37 (H) 07/30/2017 0746   ALT 58 (H) 07/30/2017 0746   ALKPHOS 60 07/30/2017 0746   BILITOT 0.72 07/30/2017 0746   GFRNONAA >89 04/07/2017 0931   GFRAA >89 04/07/2017 0931        ASSESSMENT and PLAN:   Carcinoma of upper-outer quadrant of right breast in female, estrogen receptor positive ([place]) 03/19/2017: Right lumpectomy: IDC with DCIS, 1.2 cm, grade 2, margins negative, 0/2 lymph nodes negative, ER 100%, PR 10%, HER-2 negative ratio 1.73, Ki-67 40% T1BN0 stage IA CHEK 2 Mutation  Positive Oncotype Dx score: 41 (28% ROR with Tamoxifen alone) Treatment plan: 1. Adjuvant chemotherapy with dose dense Adriamycin Cytoxan 4 followed by weekly Taxol 12 2. followed by adjuvant radiation 3. Followed by adjuvant  antiestrogen therapy ---------------------------------------------------------------------------- Current treatment: Cycle 8 of weekly Taxol Toxicities: 1. Darkening of the nailbeds 2. Difficulty Sleeping: Recommended she walk daily and continue Lorazepam and Melatonin 3. Facial rash: ? Staph related.  May benefit from doxycycline if happens again.   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 Eye irritation: I recommended lubricating eye gtts four times a day  Follow-up weekly for chemo related toxicity management   All questions were answered. The patient knows to call the clinic with any problems, questions or concerns. We can certainly see the patient much sooner if necessary.  A total of (30) minutes of face-to-face time was spent with this patient with greater than 50% of that time in counseling and care-coordination.   This note was electronically signed. Scot Dock, NP 07/30/2017

## 2017-07-30 NOTE — Assessment & Plan Note (Addendum)
03/19/2017: Right lumpectomy: IDC with DCIS, 1.2 cm, grade 2, margins negative, 0/2 lymph nodes negative, ER 100%, PR 10%, HER-2 negative ratio 1.73, Ki-67 40% T1BN0 stage IA CHEK 2 Mutation Positive Oncotype Dx score: 41 (28% ROR with Tamoxifen alone) Treatment plan: 1. Adjuvant chemotherapy with dose dense Adriamycin Cytoxan 4 followed by weekly Taxol 12 2. followed by adjuvant radiation 3. Followed by adjuvant antiestrogen therapy ---------------------------------------------------------------------------- Current treatment: Cycle 8 of weekly Taxol Toxicities: 1. Darkening of the nailbeds 2. Difficulty Sleeping: Recommended she walk daily and continue Lorazepam and Melatonin 3. Facial rash: ? Staph related.  May benefit from doxycycline if happens again.   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 Eye irritation: I recommended lubricating eye gtts four times a day  Follow-up weekly for chemo related toxicity management

## 2017-07-30 NOTE — Patient Instructions (Signed)
[facility] Discharge Instructions for Patients Receiving Chemotherapy  Today you received the following chemotherapy agents:  Taxol.  To help prevent nausea and vomiting after your treatment, we encourage you to take your nausea medication as directed.   If you develop nausea and vomiting that is not controlled by your nausea medication, call the clinic.   BELOW ARE SYMPTOMS THAT SHOULD BE REPORTED IMMEDIATELY:  *FEVER GREATER THAN 100.5 F  *CHILLS WITH OR WITHOUT FEVER  NAUSEA AND VOMITING THAT IS NOT CONTROLLED WITH YOUR NAUSEA MEDICATION  *UNUSUAL SHORTNESS OF BREATH  *UNUSUAL BRUISING OR BLEEDING  TENDERNESS IN MOUTH AND THROAT WITH OR WITHOUT PRESENCE OF ULCERS  *URINARY PROBLEMS  *BOWEL PROBLEMS  UNUSUAL RASH Items with * indicate a potential emergency and should be followed up as soon as possible.  Feel free to call the clinic should you have any questions or concerns. The clinic phone number is [phone].  Please show the CHEMO ALERT CARD at check-in to the Emergency Department and triage nurse.   

## 2017-08-06 ENCOUNTER — Encounter: Payer: Self-pay | Admitting: *Deleted

## 2017-08-06 ENCOUNTER — Other Ambulatory Visit (HOSPITAL_BASED_OUTPATIENT_CLINIC_OR_DEPARTMENT_OTHER): Payer: BLUE CROSS/BLUE SHIELD

## 2017-08-06 ENCOUNTER — Ambulatory Visit (HOSPITAL_BASED_OUTPATIENT_CLINIC_OR_DEPARTMENT_OTHER): Payer: BLUE CROSS/BLUE SHIELD

## 2017-08-06 ENCOUNTER — Ambulatory Visit: Payer: BLUE CROSS/BLUE SHIELD

## 2017-08-06 VITALS — BP 140/68 | HR 70 | Temp 98.1°F | Resp 18

## 2017-08-06 DIAGNOSIS — Z170 Estrogen receptor positive status [ER+]: Secondary | ICD-10-CM

## 2017-08-06 DIAGNOSIS — Z5111 Encounter for antineoplastic chemotherapy: Secondary | ICD-10-CM | POA: Diagnosis not present

## 2017-08-06 DIAGNOSIS — Z95828 Presence of other vascular implants and grafts: Secondary | ICD-10-CM

## 2017-08-06 DIAGNOSIS — C50411 Malignant neoplasm of upper-outer quadrant of right female breast: Secondary | ICD-10-CM

## 2017-08-06 LAB — CBC WITH DIFFERENTIAL/PLATELET
BASO%: 1.7 % (ref 0.0–2.0)
BASOS ABS: 0.1 10*3/uL (ref 0.0–0.1)
EOS ABS: 0.1 10*3/uL (ref 0.0–0.5)
EOS%: 1.7 % (ref 0.0–7.0)
HCT: 33.3 % — ABNORMAL LOW (ref 34.8–46.6)
HEMOGLOBIN: 10.9 g/dL — AB (ref 11.6–15.9)
LYMPH%: 16.6 % (ref 14.0–49.7)
MCH: 32.3 pg (ref 25.1–34.0)
MCHC: 32.7 g/dL (ref 31.5–36.0)
MCV: 98.8 fL (ref 79.5–101.0)
MONO#: 0.4 10*3/uL (ref 0.1–0.9)
MONO%: 9.8 % (ref 0.0–14.0)
NEUT%: 70.2 % (ref 38.4–76.8)
NEUTROS ABS: 2.9 10*3/uL (ref 1.5–6.5)
PLATELETS: 225 10*3/uL (ref 145–400)
RBC: 3.37 10*6/uL — AB (ref 3.70–5.45)
RDW: 14 % (ref 11.2–14.5)
WBC: 4.1 10*3/uL (ref 3.9–10.3)
lymph#: 0.7 10*3/uL — ABNORMAL LOW (ref 0.9–3.3)

## 2017-08-06 LAB — COMPREHENSIVE METABOLIC PANEL
ALT: 65 U/L — AB (ref 0–55)
AST: 43 U/L — AB (ref 5–34)
Albumin: 3.6 g/dL (ref 3.5–5.0)
Alkaline Phosphatase: 54 U/L (ref 40–150)
Anion Gap: 9 mEq/L (ref 3–11)
BUN: 13.1 mg/dL (ref 7.0–26.0)
CALCIUM: 8.8 mg/dL (ref 8.4–10.4)
CHLORIDE: 107 meq/L (ref 98–109)
CO2: 24 meq/L (ref 22–29)
CREATININE: 0.6 mg/dL (ref 0.6–1.1)
EGFR: >60 mL/min/{1.73_m2} (ref >60)
Glucose: 83 mg/dl (ref 70–140)
POTASSIUM: 4 meq/L (ref 3.5–5.1)
SODIUM: 140 meq/L (ref 136–145)
Total Bilirubin: 0.66 mg/dL (ref 0.20–1.20)
Total Protein: 6.4 g/dL (ref 6.4–8.3)

## 2017-08-06 MED ORDER — SODIUM CHLORIDE 0.9 % IV SOLN
60.0000 mg/m2 | Freq: Once | INTRAVENOUS | Status: AC
  Administered 2017-08-06: 114 mg via INTRAVENOUS
  Filled 2017-08-06: qty 19

## 2017-08-06 MED ORDER — SODIUM CHLORIDE 0.9 % IV SOLN
Freq: Once | INTRAVENOUS | Status: AC
  Administered 2017-08-06: 10:00:00 via INTRAVENOUS

## 2017-08-06 MED ORDER — SODIUM CHLORIDE 0.9% FLUSH
10.0000 mL | Freq: Once | INTRAVENOUS | Status: AC
  Administered 2017-08-06: 10 mL
  Filled 2017-08-06: qty 10

## 2017-08-06 MED ORDER — HEPARIN SOD (PORK) LOCK FLUSH 100 UNIT/ML IV SOLN
500.0000 [IU] | Freq: Once | INTRAVENOUS | Status: AC | PRN
  Administered 2017-08-06: 500 [IU]
  Filled 2017-08-06: qty 5

## 2017-08-06 MED ORDER — DIPHENHYDRAMINE HCL 50 MG/ML IJ SOLN
25.0000 mg | Freq: Once | INTRAMUSCULAR | Status: AC
Start: 2017-08-06 — End: 2017-08-06
  Administered 2017-08-06: 25 mg via INTRAVENOUS

## 2017-08-06 MED ORDER — DIPHENHYDRAMINE HCL 50 MG/ML IJ SOLN
INTRAMUSCULAR | Status: AC
  Filled 2017-08-06: qty 1

## 2017-08-06 MED ORDER — SODIUM CHLORIDE 0.9% FLUSH
10.0000 mL | INTRAVENOUS | Status: DC | PRN
  Administered 2017-08-06: 10 mL
  Filled 2017-08-06: qty 10

## 2017-08-06 MED ORDER — FAMOTIDINE IN NACL 20-0.9 MG/50ML-% IV SOLN
20.0000 mg | Freq: Once | INTRAVENOUS | Status: AC
  Administered 2017-08-06: 20 mg via INTRAVENOUS

## 2017-08-06 MED ORDER — FAMOTIDINE IN NACL 20-0.9 MG/50ML-% IV SOLN
INTRAVENOUS | Status: AC
Start: 2017-08-06 — End: ?
  Filled 2017-08-06: qty 50

## 2017-08-06 NOTE — Patient Instructions (Signed)
[facility] Discharge Instructions for Patients Receiving Chemotherapy  Today you received the following chemotherapy agent: Taxol.  To help prevent nausea and vomiting after your treatment, we encourage you to take your nausea medication as directed.  If you develop nausea and vomiting that is not controlled by your nausea medication, call the clinic.   BELOW ARE SYMPTOMS THAT SHOULD BE REPORTED IMMEDIATELY:  *FEVER GREATER THAN 100.5 F  *CHILLS WITH OR WITHOUT FEVER  NAUSEA AND VOMITING THAT IS NOT CONTROLLED WITH YOUR NAUSEA MEDICATION  *UNUSUAL SHORTNESS OF BREATH  *UNUSUAL BRUISING OR BLEEDING  TENDERNESS IN MOUTH AND THROAT WITH OR WITHOUT PRESENCE OF ULCERS  *URINARY PROBLEMS  *BOWEL PROBLEMS  UNUSUAL RASH Items with * indicate a potential emergency and should be followed up as soon as possible.  Feel free to call the clinic should you have any questions or concerns. The clinic phone number is [phone].  Please show the CHEMO ALERT CARD at check-in to the Emergency Department and triage nurse.   

## 2017-08-07 ENCOUNTER — Encounter: Payer: Self-pay | Admitting: Physician Assistant

## 2017-08-07 ENCOUNTER — Ambulatory Visit (INDEPENDENT_AMBULATORY_CARE_PROVIDER_SITE_OTHER): Payer: BLUE CROSS/BLUE SHIELD | Admitting: Physician Assistant

## 2017-08-07 VITALS — BP 122/70 | HR 88 | Temp 97.5°F | Resp 14 | Ht 66.0 in | Wt 175.0 lb

## 2017-08-07 DIAGNOSIS — E785 Hyperlipidemia, unspecified: Secondary | ICD-10-CM | POA: Diagnosis not present

## 2017-08-07 DIAGNOSIS — E559 Vitamin D deficiency, unspecified: Secondary | ICD-10-CM

## 2017-08-07 DIAGNOSIS — Z1502 Genetic susceptibility to malignant neoplasm of ovary: Secondary | ICD-10-CM

## 2017-08-07 DIAGNOSIS — F19982 Other psychoactive substance use, unspecified with psychoactive substance-induced sleep disorder: Secondary | ICD-10-CM

## 2017-08-07 DIAGNOSIS — G43809 Other migraine, not intractable, without status migrainosus: Secondary | ICD-10-CM

## 2017-08-07 DIAGNOSIS — Z1509 Genetic susceptibility to other malignant neoplasm: Secondary | ICD-10-CM

## 2017-08-07 DIAGNOSIS — Z1589 Genetic susceptibility to other disease: Secondary | ICD-10-CM

## 2017-08-07 DIAGNOSIS — C50919 Malignant neoplasm of unspecified site of unspecified female breast: Secondary | ICD-10-CM | POA: Diagnosis not present

## 2017-08-07 MED ORDER — TRAZODONE HCL 50 MG PO TABS: ORAL_TABLET | ORAL | 2 refills | Status: DC

## 2017-08-07 NOTE — Patient Instructions (Addendum)
Try the melatonin 5mg -20mg  dissolvable or gummy 30 mins before bed  Trazodone start out 1/2 pill at night can go up to 1 pill or 2 pills at night for sleep  11 Tips to Follow:  1. No caffeine after 3pm: Avoid beverages with caffeine (soda, tea, energy drinks, etc.) especially after 3pm. 2. Don't go to bed hungry: Have your evening meal at least 3 hrs. before going to sleep. It's fine to have a small bedtime snack such as a glass of milk and a few crackers but don't have a big meal. 3. Have a nightly routine before bed: Plan on "winding down" before you go to sleep. Begin relaxing about 1 hour before you go to bed. Try doing a quiet activity such as listening to calming music, reading a book or meditating. 4. Turn off the TV and ALL electronics including video games, tablets, laptops, etc. 1 hour before sleep, and keep them out of the bedroom. 5. Turn off your cell phone and all notifications (new email and text alerts) or even better, leave your phone outside your room while you sleep. Studies have shown that a part of your brain continues to respond to certain lights and sounds even while you're still asleep. 6. Make your bedroom quiet, dark and cool. If you can't control the noise, try wearing earplugs or using a fan to block out other sounds. 7. Practice relaxation techniques. Try reading a book or meditating or drain your brain by writing a list of what you need to do the next day. 8. Don't nap unless you feel sick: you'll have a better night's sleep. 9. Don't smoke, or quit if you do. Nicotine, alcohol, and marijuana can all keep you awake. Talk to your health care provider if you need help with substance use. 10. Most importantly, wake up at the same time every day (or within 1 hour of your usual wake up time) EVEN on the weekends. A regular wake up time promotes sleep hygiene and prevents sleep problems. 11. Reduce exposure to bright light in the last three hours of the day before going to  sleep. Maintaining good sleep hygiene and having good sleep habits lower your risk of developing sleep problems. Getting better sleep can also improve your concentration and alertness. Try the simple steps in this guide. If you still have trouble getting enough rest, make an appointment with your health care provider.

## 2017-08-07 NOTE — Progress Notes (Signed)
Patient ID: Rebecca [name], female   DOB: 02/05/1956, 61 y.o.   MRN: [id number]  Send message gudena and message patient jan 2nc  Assessment and Plan:  Hyperlipidemia, unspecified hyperlipidemia type  check lipids next OV, decrease fatty foods, increase activity.   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 Lindi Adie.   Vitamin D deficiency -     Get back on vitamin D after chemotherapy completion  Discussed med's effects and SE's. Screening labs and tests as requested with regular follow-up as recommended. WILL GET LABS AT NEXT OV PER PATIENT REQUEST WILL START TO EAT BETTER AFTER DONE WITH CHEMO  Future Appointments  Date Time Provider [place]  08/13/2017  8:00 AM [facility] LAB 2 [facility] None  08/13/2017  8:30 AM [facility] FLUSH NURSE [facility] None  08/13/2017  9:00 AM Nicholas Lose, MD [facility] None  08/13/2017  9:30 AM [facility] B5 [facility] None  08/20/2017  8:00 AM [facility] LAB 2 [facility] None  08/20/2017  8:30 AM [facility] FLUSH NURSE [facility] None  08/20/2017  9:30 AM [facility] A1 [facility] None  08/27/2017  8:00 AM [facility] LAB 1 [facility] None  08/27/2017  8:30 AM [facility] FLUSH NURSE [facility] None  08/27/2017  9:00 AM Causey, Charlestine Massed, NP [facility] None  08/27/2017  9:30 AM [facility] F19 [facility] None  04/08/2018  9:00 AM Vicie Mutters, PA-C [facility] None    HPI  61 y.o. female  presents for a follow up for HTN, chol, vit D def, currently getting chemo for right breast cancer.   Her blood pressure has been controlled at home, today their BP  is BP: 122/70.  She does workout. She denies chest pain, shortness of breath, dizziness.   She had a Right lumpectomy on 03/19/2017 with Dr. Excell Seltzer: IDC with DCIS, 1.2 cm, grade 2, margins negative, 0/2 lymph nodes negative, ER 100%, PR 10%, HER-2 negative ratio 1.73, Ki-67 40% T1BN0 stage IA and has been getting systemic chemo, has 3 more sessions, last one jan 2nd, adriamycin and cytoxan finished and on taxol, follows with [facility], Dr. Lindi Adie.  She states she has trouble sleeping, ativan 1-2 at night is not helping. Some SOB with exertion, no CP, no cough, no fever, chills. Hair is starting to grow back, no rashes.   She is not on cholesterol medication and denies myalgias. Her cholesterol is at goal. The cholesterol last visit was:  Lab Results  Component Value Date   CHOL 255 (H) 04/07/2017   HDL 72 04/07/2017   LDLCALC 166 (H) 04/07/2017   TRIG 83 04/07/2017   CHOLHDL 3.5 04/07/2017  . She has been working on diet and exercise for prediabetes,  and denies foot ulcerations, hyperglycemia, hypoglycemia , increased appetite, nausea, paresthesia of the feet, polydipsia, polyuria, visual disturbances, vomiting and weight loss. Last A1C in the office was:  Lab Results  Component Value Date   HGBA1C 5.5 03/20/2016   Patient is on Vitamin D supplement.   Lab Results  Component Value Date   VD25OH 30 04/07/2017     BMI is Body mass index is 28.25 kg/m., she is working on diet and exercise. Wt Readings from Last  3 Encounters:  08/07/17 175 lb (79.4 kg)  07/30/17 173 lb (78.5 kg)  07/23/17 173 lb 9.6 oz (78.7 kg)     Current Medications:  Current Outpatient Medications on File Prior to Visit  Medication Sig Dispense Refill  . ibuprofen (ADVIL,MOTRIN) 200 MG tablet Take 200 mg by mouth every 6 (six) hours as needed for mild pain.    Marland Kitchen lidocaine-prilocaine (EMLA) cream Apply to affected area once 30 g 3  . LORazepam (ATIVAN) 0.5 MG tablet Take 1 tablet (0.5 mg total) by  mouth at bedtime. As needed 30 tablet 1  . ondansetron (ZOFRAN) 8 MG tablet Take 1 tablet (8 mg total) by mouth 2 (two) times daily as needed. Start on the third day after chemotherapy. 30 tablet 1  . prochlorperazine (COMPAZINE) 10 MG tablet Take 1 tablet (10 mg total) by mouth every 6 (six) hours as needed (Nausea or vomiting). 30 tablet 1   No current facility-administered medications on file prior to visit.     Medical History:  Past Medical History:  Diagnosis Date  . Depression    REMISSION/ SITUATIONAL  . Dyspnea   . Family history of brain cancer   . Family history of breast cancer   . Family history of colon cancer   . Family history of melanoma   . Hepatitis AGE 17   RESOLVED  . Hyperlipidemia   . Insomnia   . Migraines    HORMONAL  . Shingles outbreak 2003, 2015   RIGHT HIP, and between breasts 3 years ago    Allergies Allergies  Allergen Reactions  . Citalopram Other (See Comments)    HYPERACTIVITY  . Citalopram     Hyper  . Dexamethasone Other (See Comments)    CHEST TIGHTNESS  . Dexamethasone     Chest tightness  . Dilaudid [Hydromorphone Hcl] Nausea And Vomiting  . Propoxycaine Nausea And Vomiting  . Red Yeast Rice [Cholestin] Other (See Comments)    headache  . Rofecoxib Nausea And Vomiting  . Sulfamethoxazole Swelling    Surgical History: reviewed and unchanged Family History: reviewed and unchanged Social History: reviewed and unchanged  Review of Systems: Review of Systems  Constitutional: Negative for chills, fever and malaise/fatigue.  HENT: Negative for congestion, ear pain and sore throat.   Eyes: Negative.   Respiratory: Negative for cough, shortness of breath and wheezing.   Cardiovascular: Negative for chest pain, palpitations and leg swelling.  Gastrointestinal: Negative for abdominal pain, blood in stool, constipation, diarrhea, heartburn and melena.  Genitourinary: Negative.   Skin: Negative.   Neurological: Negative for  dizziness, sensory change, loss of consciousness and headaches.  Psychiatric/Behavioral: Negative for depression, hallucinations, memory loss, substance abuse and suicidal ideas. The patient has insomnia. The patient is not nervous/anxious.     Physical Exam: Estimated body mass index is 28.25 kg/m as calculated from the following:   Height as of this encounter: _0  (1.676 m).   Weight as of this encounter: 175 lb (79.4 kg). BP 122/70   Pulse 88   Temp (!) 97.5 F (36.4 C)   Resp 14   Ht _1  (1.676 m)   Wt 175 lb (79.4 kg)   LMP 08/03/2013   SpO2 93%   BMI 28.25 kg/m   General Appearance: Well nourished well developed, in no apparent distress.  Eyes: PERRLA, EOMs, conjunctiva no swelling or erythema ENT/Mouth: Ear canals normal without obstruction, swelling, erythema, or discharge.  TMs normal bilaterally with no erythema, bulging, retraction, or  loss of landmark.  Oropharynx moist and clear with no exudate, erythema, or swelling.   Neck: Supple, thyroid normal. No bruits.  No cervical adenopathy Respiratory: Respiratory effort normal, Breath sounds clear A&P without wheeze, rhonchi, rales.   Cardio: RRR without murmurs, rubs or gallops. Brisk peripheral pulses without edema.  Chest: symmetric, with normal excursions Abdomen: Soft, nontender, no guarding, rebound, hernias, masses, or organomegaly.  Lymphatics: Non tender without lymphadenopathy.  Musculoskeletal: Full ROM all peripheral extremities,5/5 strength, and normal gait.  Skin: Warm, dry without rashes, lesions, ecchymosis. Neuro: Awake and oriented X 3, Cranial nerves intact, reflexes equal bilaterally. Normal muscle tone, no cerebellar symptoms. Sensation intact.  Psych:  normal affect, Insight and Judgment appropriate.    Vicie Mutters 8:48 AM [facility]

## 2017-08-12 NOTE — Assessment & Plan Note (Signed)
03/19/2017: Right lumpectomy: IDC with DCIS, 1.2 cm, grade 2, margins negative, 0/2 lymph nodes negative, ER 100%, PR 10%, HER-2 negative ratio 1.73, Ki-67 40% T1BN0 stage IA CHEK 2 Mutation Positive Oncotype Dx score: 41 (28% ROR with Tamoxifen alone) Treatment plan: 1. Adjuvant chemotherapy with dose dense Adriamycin Cytoxan 4 followed by weekly Taxol 12 2. followed by adjuvant radiation 3. Followed by adjuvant antiestrogen therapy ---------------------------------------------------------------------------- Current treatment: Cycle 10 of weekly Taxol Toxicities: 1. Darkening of the nailbeds 2. Difficulty Sleeping: Recommended she walk daily and continue Lorazepam and Melatonin 3. Facial rash: ? Staph related.  May benefit from doxycycline if happens again.   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 Eye irritation: I recommended lubricating eye gtts four times a day  Follow-up weekly for chemo and in 2 weeks with me

## 2017-08-13 ENCOUNTER — Ambulatory Visit: Payer: BLUE CROSS/BLUE SHIELD

## 2017-08-13 ENCOUNTER — Ambulatory Visit (HOSPITAL_BASED_OUTPATIENT_CLINIC_OR_DEPARTMENT_OTHER): Payer: BLUE CROSS/BLUE SHIELD

## 2017-08-13 ENCOUNTER — Other Ambulatory Visit (HOSPITAL_BASED_OUTPATIENT_CLINIC_OR_DEPARTMENT_OTHER): Payer: BLUE CROSS/BLUE SHIELD

## 2017-08-13 ENCOUNTER — Ambulatory Visit (HOSPITAL_BASED_OUTPATIENT_CLINIC_OR_DEPARTMENT_OTHER): Payer: BLUE CROSS/BLUE SHIELD | Admitting: Hematology and Oncology

## 2017-08-13 DIAGNOSIS — Z170 Estrogen receptor positive status [ER+]: Secondary | ICD-10-CM

## 2017-08-13 DIAGNOSIS — Z5111 Encounter for antineoplastic chemotherapy: Secondary | ICD-10-CM

## 2017-08-13 DIAGNOSIS — C50411 Malignant neoplasm of upper-outer quadrant of right female breast: Secondary | ICD-10-CM

## 2017-08-13 DIAGNOSIS — Z95828 Presence of other vascular implants and grafts: Secondary | ICD-10-CM

## 2017-08-13 DIAGNOSIS — G620 Drug-induced polyneuropathy: Secondary | ICD-10-CM

## 2017-08-13 LAB — COMPREHENSIVE METABOLIC PANEL
ALBUMIN: 3.7 g/dL (ref 3.5–5.0)
ALK PHOS: 61 U/L (ref 40–150)
ALT: 74 U/L — ABNORMAL HIGH (ref 0–55)
ANION GAP: 7 meq/L (ref 3–11)
AST: 45 U/L — ABNORMAL HIGH (ref 5–34)
BILIRUBIN TOTAL: 0.73 mg/dL (ref 0.20–1.20)
BUN: 15.7 mg/dL (ref 7.0–26.0)
CALCIUM: 8.8 mg/dL (ref 8.4–10.4)
CO2: 25 mEq/L (ref 22–29)
Chloride: 107 mEq/L (ref 98–109)
Creatinine: 0.7 mg/dL (ref 0.6–1.1)
GLUCOSE: 83 mg/dL (ref 70–140)
POTASSIUM: 4.1 meq/L (ref 3.5–5.1)
Sodium: 139 mEq/L (ref 136–145)
TOTAL PROTEIN: 6.5 g/dL (ref 6.4–8.3)

## 2017-08-13 LAB — CBC WITH DIFFERENTIAL/PLATELET
BASO%: 3 % — ABNORMAL HIGH (ref 0.0–2.0)
BASOS ABS: 0.1 10*3/uL (ref 0.0–0.1)
EOS ABS: 0.1 10*3/uL (ref 0.0–0.5)
EOS%: 1.7 % (ref 0.0–7.0)
HEMATOCRIT: 33 % — AB (ref 34.8–46.6)
HEMOGLOBIN: 11.2 g/dL — AB (ref 11.6–15.9)
LYMPH%: 19 % (ref 14.0–49.7)
MCH: 32.8 pg (ref 25.1–34.0)
MCHC: 34.1 g/dL (ref 31.5–36.0)
MCV: 96.2 fL (ref 79.5–101.0)
MONO#: 0.4 10*3/uL (ref 0.1–0.9)
MONO%: 10.4 % (ref 0.0–14.0)
NEUT%: 65.9 % (ref 38.4–76.8)
NEUTROS ABS: 2.7 10*3/uL (ref 1.5–6.5)
PLATELETS: 226 10*3/uL (ref 145–400)
RBC: 3.43 10*6/uL — ABNORMAL LOW (ref 3.70–5.45)
RDW: 14.5 % (ref 11.2–14.5)
WBC: 4.1 10*3/uL (ref 3.9–10.3)
lymph#: 0.8 10*3/uL — ABNORMAL LOW (ref 0.9–3.3)

## 2017-08-13 MED ORDER — SODIUM CHLORIDE 0.9% FLUSH
10.0000 mL | Freq: Once | INTRAVENOUS | Status: AC
  Administered 2017-08-13: 10 mL
  Filled 2017-08-13: qty 10

## 2017-08-13 MED ORDER — DIPHENHYDRAMINE HCL 50 MG/ML IJ SOLN
25.0000 mg | Freq: Once | INTRAMUSCULAR | Status: AC
  Administered 2017-08-13: 25 mg via INTRAVENOUS

## 2017-08-13 MED ORDER — SODIUM CHLORIDE 0.9% FLUSH
10.0000 mL | INTRAVENOUS | Status: DC | PRN
  Administered 2017-08-13: 10 mL
  Filled 2017-08-13: qty 10

## 2017-08-13 MED ORDER — SODIUM CHLORIDE 0.9 % IV SOLN
Freq: Once | INTRAVENOUS | Status: AC
  Administered 2017-08-13: 10:00:00 via INTRAVENOUS

## 2017-08-13 MED ORDER — HEPARIN SOD (PORK) LOCK FLUSH 100 UNIT/ML IV SOLN
500.0000 [IU] | Freq: Once | INTRAVENOUS | Status: AC | PRN
  Administered 2017-08-13: 500 [IU]
  Filled 2017-08-13: qty 5

## 2017-08-13 MED ORDER — SODIUM CHLORIDE 0.9 % IV SOLN
60.0000 mg/m2 | Freq: Once | INTRAVENOUS | Status: AC
  Administered 2017-08-13: 114 mg via INTRAVENOUS
  Filled 2017-08-13: qty 19

## 2017-08-13 MED ORDER — FAMOTIDINE IN NACL 20-0.9 MG/50ML-% IV SOLN
20.0000 mg | Freq: Once | INTRAVENOUS | Status: AC
  Administered 2017-08-13: 20 mg via INTRAVENOUS

## 2017-08-13 MED ORDER — FAMOTIDINE IN NACL 20-0.9 MG/50ML-% IV SOLN
INTRAVENOUS | Status: AC
  Filled 2017-08-13: qty 50

## 2017-08-13 MED ORDER — DIPHENHYDRAMINE HCL 50 MG/ML IJ SOLN
INTRAMUSCULAR | Status: AC
  Filled 2017-08-13: qty 1

## 2017-08-13 NOTE — Progress Notes (Signed)
Patient Care Team: [name], MD as PCP - General (Internal Medicine) [name], MD (Internal Medicine) Arta Silence, MD as Consulting Physician (Gastroenterology) Arvella Nigh, MD as Consulting Physician (Obstetrics and Gynecology)  DIAGNOSIS:  Encounter Diagnosis  Name Primary?  . 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  The STAT Breast cancer panel offered by [facility] includes sequencing and rearrangement analysis for the following 9 genes:  ATM, BRCA1, BRCA2, CDH1, CHEK2, PALB2, PTEN, STK11 and TP53.   The report date is March 09, 2017.  Re-requitisioned to the common hereditary cancer panel and the melanoma panel.  The additional genes tested were negative.  The Hereditary Gene Panel offered by [facility] includes sequencing and/or deletion duplication testing of the following 46 genes: APC, ATM, AXIN2, BARD1, BMPR1A, BRCA1, BRCA2, BRIP1, CDH1, CDKN2A (p14ARF), CDKN2A (p16INK4a), CHEK2, CTNNA1, DICER1, EPCAM (Deletion/duplication testing only), GREM1 (promoter region deletion/duplication testing only), KIT, MEN1, MLH1, MSH2, MSH3, MSH6, MUTYH, NBN, NF1, NHTL1, PALB2, PDGFRA, PMS2, POLD1, POLE, PTEN, RAD50, RAD51C, RAD51D, SDHB, SDHC, SDHD, SMAD4, SMARCA4. STK11, TP53, TSC1, TSC2, and VHL.  The following  genes were evaluated for sequence changes only: SDHA and HOXB13 c.251G>A variant only.  The Melanoma panel offered by [facility] includes sequencing and/or deletion duplication testing of the following 12 genes: BAP1, BRCA1, BRCA2, BRIP1, CDK4, CDKN2A (p14ARF), CDKN2A (p16INK4a), MC1R, POT1, PTEN, RB1, TERT, and TP53.  The following gene was evaluated for sequence changes only: MITF (c.952G>A, p.GLU318Lys variant only). The updated report date is March 14, 2017.        03/19/2017 Surgery    Right lumpectomy: IDC with DCIS, 1.2 cm, grade 2, margins negative, 0/2 lymph nodes negative, ER 100%, PR 10%, HER-2 negative ratio 1.73, Ki-67 40% T1BN0 stage IA      04/01/2017 Oncotype testing    Oncotype Dx score: 41 (28% ROR with Tamoxifen alone)      04/16/2017 -  Chemotherapy    Dose dense Adriamycin and Cytoxan 4 followed by Taxol weekly 12        CHIEF COMPLIANT: Cycle 10 Taxol  INTERVAL HISTORY: Rebecca [name] is a 61 year old with above-mentioned history of left right breast cancer treated with lumpectomy and is now on adjuvant chemotherapy and today's cycle 10 of Taxol.  Overall she is tolerating the treatment extremely well.  She does not have neuropathy.  Does not have any nausea vomiting.  REVIEW OF SYSTEMS:   Constitutional: Denies fevers, chills or abnormal weight loss Eyes: Denies blurriness of vision Ears, nose, mouth, throat, and face: Denies mucositis or sore throat Respiratory: Denies cough, dyspnea or wheezes Cardiovascular: Denies palpitation, chest discomfort Gastrointestinal:  Denies nausea, heartburn or change in bowel habits Skin: Denies abnormal skin rashes Lymphatics: Denies new lymphadenopathy or easy bruising Neurological:Denies numbness, tingling or new  weaknesses Behavioral/Psych: Mood is stable, no new changes  Extremities: No lower extremity edema Breast:  denies any pain or lumps or nodules in either breasts All other systems were reviewed with the patient and are  negative.  I have reviewed the past medical history, past surgical history, social history and family history with the patient and they are unchanged from previous note.  ALLERGIES:  is allergic to citalopram; citalopram; dexamethasone; dexamethasone; dilaudid [hydromorphone hcl]; propoxycaine; red yeast rice [cholestin]; rofecoxib; and sulfamethoxazole.  MEDICATIONS:  Current Outpatient Medications  Medication Sig Dispense Refill  . ibuprofen (ADVIL,MOTRIN) 200 MG tablet Take 200 mg by mouth every 6 (six) hours as needed for mild pain.    Marland Kitchen lidocaine-prilocaine (EMLA) cream Apply to affected area once 30 g 3  . LORazepam (ATIVAN) 0.5 MG tablet Take 1 tablet (0.5 mg total) by mouth at bedtime. As needed 30 tablet 1  . ondansetron (ZOFRAN) 8 MG tablet Take 1 tablet (8 mg total) by mouth 2 (two) times daily as needed. Start on the third day after chemotherapy. 30 tablet 1  . prochlorperazine (COMPAZINE) 10 MG tablet Take 1 tablet (10 mg total) by mouth every 6 (six) hours as needed (Nausea or vomiting). 30 tablet 1  . traZODone (DESYREL) 50 MG tablet 1/2-1 tablet for sleep 30 tablet 2   No current facility-administered medications for this visit.     PHYSICAL EXAMINATION: ECOG PERFORMANCE STATUS: 1 - Symptomatic but completely ambulatory  Vitals:   08/13/17 0836  BP: 128/67  Pulse: 75  Resp: 18  Temp: (!) 97.5 F (36.4 C)  SpO2: 99%   Filed Weights   08/13/17 0836  Weight: 174 lb (78.9 kg)    GENERAL:alert, no distress and comfortable SKIN: skin color, texture, turgor are normal, no rashes or significant lesions EYES: normal, Conjunctiva are pink and non-injected, sclera clear OROPHARYNX:no exudate, no erythema and lips, buccal mucosa, and tongue normal  NECK: supple, thyroid normal size, non-tender, without nodularity LYMPH:  no palpable lymphadenopathy in the cervical, axillary or inguinal LUNGS: clear to auscultation and percussion with normal breathing effort HEART:  regular rate & rhythm and no murmurs and no lower extremity edema ABDOMEN:abdomen soft, non-tender and normal bowel sounds MUSCULOSKELETAL:no cyanosis of digits and no clubbing  NEURO: alert & oriented x 3 with fluent speech, no focal motor/sensory deficits EXTREMITIES: No lower extremity edema  LABORATORY DATA:  I have reviewed the data as listed   Chemistry      Component Value Date/Time   NA 139 08/13/2017 0749   K 4.1 08/13/2017 0749   CL 105 04/07/2017 0931   CO2 25 08/13/2017 0749   BUN 15.7 08/13/2017 0749   CREATININE 0.7 08/13/2017 0749      Component Value Date/Time   CALCIUM 8.8 08/13/2017 0749   ALKPHOS 61 08/13/2017 0749   AST 45 (H) 08/13/2017 0749   ALT 74 (H) 08/13/2017 0749   BILITOT 0.73 08/13/2017 0749       Lab Results  Component Value Date   WBC 4.1 08/13/2017   HGB 11.2 (L) 08/13/2017   HCT 33.0 (L) 08/13/2017   MCV 96.2 08/13/2017   PLT 226 08/13/2017   NEUTROABS 2.7 08/13/2017    ASSESSMENT & PLAN:  Carcinoma of upper-outer quadrant of right breast in female, estrogen receptor positive ([place]) 03/19/2017: Right lumpectomy: IDC with DCIS, 1.2 cm, grade 2, margins negative, 0/2 lymph nodes negative, ER 100%, PR 10%, HER-2 negative ratio 1.73, Ki-67 40% T1BN0 stage IA CHEK 2 Mutation  Positive Oncotype Dx score: 41 (28% ROR with Tamoxifen alone) Treatment plan: 1. Adjuvant chemotherapy with dose dense Adriamycin Cytoxan 4 followed by weekly Taxol 12 2. followed by adjuvant radiation 3. Followed by adjuvant antiestrogen therapy ---------------------------------------------------------------------------- Current treatment: Cycle 10 of weekly Taxol Toxicities: 1. Darkening of the nailbeds 2. Difficulty Sleeping: Recommended she walk daily and continue Lorazepam and Melatonin 3. Facial rash: ? Staph related.  May benefit from doxycycline if happens again.   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 Eye  irritation: I recommended lubricating eye gtts four times a day  Denies neuropathy Labs have been reviewed Monitoring closely for chemo toxicities Follow-up weekly for chemo and in 2 weeks she will complete her chemo. After that we will need to make radiation oncology appointment. I will see her back towards the end of radiation to begin antiestrogen therapy.   I spent 25 minutes talking to the patient of which more than half was spent in counseling and coordination of care.  No orders of the defined types were placed in this encounter.  The patient has a good understanding of the overall plan. she agrees with it. she will call with any problems that may develop before the next visit here.   Rulon Eisenmenger, MD 08/13/17

## 2017-08-13 NOTE — Patient Instructions (Signed)
[facility] Discharge Instructions for Patients Receiving Chemotherapy  Today you received the following chemotherapy agent: Taxol.  To help prevent nausea and vomiting after your treatment, we encourage you to take your nausea medication as directed.  If you develop nausea and vomiting that is not controlled by your nausea medication, call the clinic.   BELOW ARE SYMPTOMS THAT SHOULD BE REPORTED IMMEDIATELY:  *FEVER GREATER THAN 100.5 F  *CHILLS WITH OR WITHOUT FEVER  NAUSEA AND VOMITING THAT IS NOT CONTROLLED WITH YOUR NAUSEA MEDICATION  *UNUSUAL SHORTNESS OF BREATH  *UNUSUAL BRUISING OR BLEEDING  TENDERNESS IN MOUTH AND THROAT WITH OR WITHOUT PRESENCE OF ULCERS  *URINARY PROBLEMS  *BOWEL PROBLEMS  UNUSUAL RASH Items with * indicate a potential emergency and should be followed up as soon as possible.  Feel free to call the clinic should you have any questions or concerns. The clinic phone number is [phone].  Please show the CHEMO ALERT CARD at check-in to the Emergency Department and triage nurse.   

## 2017-08-20 ENCOUNTER — Ambulatory Visit (HOSPITAL_BASED_OUTPATIENT_CLINIC_OR_DEPARTMENT_OTHER): Payer: BLUE CROSS/BLUE SHIELD

## 2017-08-20 ENCOUNTER — Other Ambulatory Visit (HOSPITAL_BASED_OUTPATIENT_CLINIC_OR_DEPARTMENT_OTHER): Payer: BLUE CROSS/BLUE SHIELD

## 2017-08-20 VITALS — BP 142/71 | HR 77 | Temp 98.0°F | Resp 18

## 2017-08-20 DIAGNOSIS — C50411 Malignant neoplasm of upper-outer quadrant of right female breast: Secondary | ICD-10-CM | POA: Diagnosis not present

## 2017-08-20 DIAGNOSIS — Z5111 Encounter for antineoplastic chemotherapy: Secondary | ICD-10-CM | POA: Diagnosis not present

## 2017-08-20 DIAGNOSIS — Z170 Estrogen receptor positive status [ER+]: Secondary | ICD-10-CM

## 2017-08-20 LAB — COMPREHENSIVE METABOLIC PANEL
ALT: 61 U/L — AB (ref 0–55)
AST: 37 U/L — AB (ref 5–34)
Albumin: 3.5 g/dL (ref 3.5–5.0)
Alkaline Phosphatase: 57 U/L (ref 40–150)
Anion Gap: 7 mEq/L (ref 3–11)
BUN: 11.7 mg/dL (ref 7.0–26.0)
CHLORIDE: 107 meq/L (ref 98–109)
CO2: 25 meq/L (ref 22–29)
CREATININE: 0.7 mg/dL (ref 0.6–1.1)
Calcium: 8.5 mg/dL (ref 8.4–10.4)
EGFR: >60 mL/min/{1.73_m2} (ref >60)
Glucose: 81 mg/dl (ref 70–140)
Potassium: 4.1 mEq/L (ref 3.5–5.1)
Sodium: 139 mEq/L (ref 136–145)
Total Bilirubin: 0.49 mg/dL (ref 0.20–1.20)
Total Protein: 6.1 g/dL — ABNORMAL LOW (ref 6.4–8.3)

## 2017-08-20 LAB — CBC WITH DIFFERENTIAL/PLATELET
BASO%: 1.4 % (ref 0.0–2.0)
Basophils Absolute: 0.1 10*3/uL (ref 0.0–0.1)
EOS%: 1.6 % (ref 0.0–7.0)
Eosinophils Absolute: 0.1 10*3/uL (ref 0.0–0.5)
HCT: 33.5 % — ABNORMAL LOW (ref 34.8–46.6)
HGB: 11 g/dL — ABNORMAL LOW (ref 11.6–15.9)
LYMPH#: 0.9 10*3/uL (ref 0.9–3.3)
LYMPH%: 20.4 % (ref 14.0–49.7)
MCH: 32.4 pg (ref 25.1–34.0)
MCHC: 32.8 g/dL (ref 31.5–36.0)
MCV: 98.5 fL (ref 79.5–101.0)
MONO#: 0.4 10*3/uL (ref 0.1–0.9)
MONO%: 9.3 % (ref 0.0–14.0)
NEUT#: 3 10*3/uL (ref 1.5–6.5)
NEUT%: 67.3 % (ref 38.4–76.8)
Platelets: 237 10*3/uL (ref 145–400)
RBC: 3.4 10*6/uL — AB (ref 3.70–5.45)
RDW: 13.5 % (ref 11.2–14.5)
WBC: 4.4 10*3/uL (ref 3.9–10.3)

## 2017-08-20 MED ORDER — DIPHENHYDRAMINE HCL 50 MG/ML IJ SOLN
INTRAMUSCULAR | Status: AC
  Filled 2017-08-20: qty 1

## 2017-08-20 MED ORDER — SODIUM CHLORIDE 0.9 % IV SOLN
60.0000 mg/m2 | Freq: Once | INTRAVENOUS | Status: AC
  Administered 2017-08-20: 114 mg via INTRAVENOUS
  Filled 2017-08-20: qty 19

## 2017-08-20 MED ORDER — SODIUM CHLORIDE 0.9 % IV SOLN
Freq: Once | INTRAVENOUS | Status: AC
Start: 2017-08-20 — End: 2017-08-20
  Administered 2017-08-20: 10:00:00 via INTRAVENOUS

## 2017-08-20 MED ORDER — HEPARIN SOD (PORK) LOCK FLUSH 100 UNIT/ML IV SOLN
500.0000 [IU] | Freq: Once | INTRAVENOUS | Status: AC | PRN
  Administered 2017-08-20: 500 [IU]
  Filled 2017-08-20: qty 5

## 2017-08-20 MED ORDER — DIPHENHYDRAMINE HCL 50 MG/ML IJ SOLN
25.0000 mg | Freq: Once | INTRAMUSCULAR | Status: AC
  Administered 2017-08-20: 25 mg via INTRAVENOUS

## 2017-08-20 MED ORDER — FAMOTIDINE IN NACL 20-0.9 MG/50ML-% IV SOLN
INTRAVENOUS | Status: AC
  Filled 2017-08-20: qty 50

## 2017-08-20 MED ORDER — SODIUM CHLORIDE 0.9% FLUSH
10.0000 mL | INTRAVENOUS | Status: DC | PRN
  Administered 2017-08-20: 10 mL
  Filled 2017-08-20: qty 10

## 2017-08-20 MED ORDER — FAMOTIDINE IN NACL 20-0.9 MG/50ML-% IV SOLN
20.0000 mg | Freq: Once | INTRAVENOUS | Status: AC
  Administered 2017-08-20: 20 mg via INTRAVENOUS

## 2017-08-20 NOTE — Patient Instructions (Signed)
[facility] Discharge Instructions for Patients Receiving Chemotherapy  Today you received the following chemotherapy agent: Taxol.  To help prevent nausea and vomiting after your treatment, we encourage you to take your nausea medication as directed.  If you develop nausea and vomiting that is not controlled by your nausea medication, call the clinic.   BELOW ARE SYMPTOMS THAT SHOULD BE REPORTED IMMEDIATELY:  *FEVER GREATER THAN 100.5 F  *CHILLS WITH OR WITHOUT FEVER  NAUSEA AND VOMITING THAT IS NOT CONTROLLED WITH YOUR NAUSEA MEDICATION  *UNUSUAL SHORTNESS OF BREATH  *UNUSUAL BRUISING OR BLEEDING  TENDERNESS IN MOUTH AND THROAT WITH OR WITHOUT PRESENCE OF ULCERS  *URINARY PROBLEMS  *BOWEL PROBLEMS  UNUSUAL RASH Items with * indicate a potential emergency and should be followed up as soon as possible.  Feel free to call the clinic should you have any questions or concerns. The clinic phone number is [phone].  Please show the CHEMO ALERT CARD at check-in to the Emergency Department and triage nurse.   

## 2017-08-21 ENCOUNTER — Telehealth: Payer: Self-pay

## 2017-08-21 NOTE — Telephone Encounter (Signed)
Returned pt call regarding swelling in hands and feet. Explained to her that is not a direct side effect of her chemo and would not cause a problem with her last treatment scheduled next week. Explained this is most likely due to her body just trying to distribute all the fluid from infusion yesterday. Suggested to pt to just continue to monitor and call if it gets worse. To elevate extremities, watch salt intake, and drink fluids. No further questions at this time. Cyndia Bent RN

## 2017-08-27 ENCOUNTER — Encounter: Payer: Self-pay | Admitting: Physician Assistant

## 2017-08-27 ENCOUNTER — Telehealth: Payer: Self-pay | Admitting: Adult Health

## 2017-08-27 ENCOUNTER — Encounter: Payer: Self-pay | Admitting: Adult Health

## 2017-08-27 ENCOUNTER — Other Ambulatory Visit (HOSPITAL_BASED_OUTPATIENT_CLINIC_OR_DEPARTMENT_OTHER): Payer: BLUE CROSS/BLUE SHIELD

## 2017-08-27 ENCOUNTER — Ambulatory Visit (HOSPITAL_BASED_OUTPATIENT_CLINIC_OR_DEPARTMENT_OTHER): Payer: BLUE CROSS/BLUE SHIELD | Admitting: Adult Health

## 2017-08-27 ENCOUNTER — Ambulatory Visit: Payer: BLUE CROSS/BLUE SHIELD

## 2017-08-27 ENCOUNTER — Ambulatory Visit (HOSPITAL_BASED_OUTPATIENT_CLINIC_OR_DEPARTMENT_OTHER): Payer: BLUE CROSS/BLUE SHIELD

## 2017-08-27 VITALS — BP 138/72 | HR 77 | Temp 98.0°F | Resp 18 | Ht 66.0 in | Wt 176.0 lb

## 2017-08-27 DIAGNOSIS — Z170 Estrogen receptor positive status [ER+]: Secondary | ICD-10-CM

## 2017-08-27 DIAGNOSIS — C50411 Malignant neoplasm of upper-outer quadrant of right female breast: Secondary | ICD-10-CM

## 2017-08-27 DIAGNOSIS — G620 Drug-induced polyneuropathy: Secondary | ICD-10-CM | POA: Diagnosis not present

## 2017-08-27 DIAGNOSIS — Z95828 Presence of other vascular implants and grafts: Secondary | ICD-10-CM

## 2017-08-27 DIAGNOSIS — Z5111 Encounter for antineoplastic chemotherapy: Secondary | ICD-10-CM

## 2017-08-27 LAB — COMPREHENSIVE METABOLIC PANEL
ALBUMIN: 3.7 g/dL (ref 3.5–5.0)
ALK PHOS: 56 U/L (ref 40–150)
ALT: 71 U/L — ABNORMAL HIGH (ref 0–55)
ANION GAP: 6 meq/L (ref 3–11)
AST: 43 U/L — ABNORMAL HIGH (ref 5–34)
BILIRUBIN TOTAL: 0.62 mg/dL (ref 0.20–1.20)
BUN: 14.3 mg/dL (ref 7.0–26.0)
CO2: 25 mEq/L (ref 22–29)
Calcium: 8.8 mg/dL (ref 8.4–10.4)
Chloride: 107 mEq/L (ref 98–109)
Creatinine: 0.7 mg/dL (ref 0.6–1.1)
EGFR: >60 mL/min/{1.73_m2} (ref >60)
Glucose: 80 mg/dl (ref 70–140)
POTASSIUM: 4 meq/L (ref 3.5–5.1)
Sodium: 139 mEq/L (ref 136–145)
TOTAL PROTEIN: 6.4 g/dL (ref 6.4–8.3)

## 2017-08-27 LAB — CBC WITH DIFFERENTIAL/PLATELET
BASO%: 1.8 % (ref 0.0–2.0)
Basophils Absolute: 0.1 10*3/uL (ref 0.0–0.1)
EOS ABS: 0 10*3/uL (ref 0.0–0.5)
EOS%: 1.2 % (ref 0.0–7.0)
HCT: 33.6 % — ABNORMAL LOW (ref 34.8–46.6)
HGB: 11.4 g/dL — ABNORMAL LOW (ref 11.6–15.9)
LYMPH%: 19.7 % (ref 14.0–49.7)
MCH: 32.4 pg (ref 25.1–34.0)
MCHC: 33.9 g/dL (ref 31.5–36.0)
MCV: 95.6 fL (ref 79.5–101.0)
MONO#: 0.4 10*3/uL (ref 0.1–0.9)
MONO%: 9.9 % (ref 0.0–14.0)
NEUT%: 67.4 % (ref 38.4–76.8)
NEUTROS ABS: 2.4 10*3/uL (ref 1.5–6.5)
Platelets: 218 10*3/uL (ref 145–400)
RBC: 3.52 10*6/uL — AB (ref 3.70–5.45)
RDW: 13.6 % (ref 11.2–14.5)
WBC: 3.6 10*3/uL — AB (ref 3.9–10.3)
lymph#: 0.7 10*3/uL — ABNORMAL LOW (ref 0.9–3.3)

## 2017-08-27 MED ORDER — SODIUM CHLORIDE 0.9% FLUSH
10.0000 mL | Freq: Once | INTRAVENOUS | Status: AC
  Administered 2017-08-27: 10 mL
  Filled 2017-08-27: qty 10

## 2017-08-27 MED ORDER — DIPHENHYDRAMINE HCL 50 MG/ML IJ SOLN
INTRAMUSCULAR | Status: AC
  Filled 2017-08-27: qty 1

## 2017-08-27 MED ORDER — SODIUM CHLORIDE 0.9 % IV SOLN
Freq: Once | INTRAVENOUS | Status: AC
  Administered 2017-08-27: 10:00:00 via INTRAVENOUS

## 2017-08-27 MED ORDER — FAMOTIDINE IN NACL 20-0.9 MG/50ML-% IV SOLN
INTRAVENOUS | Status: AC
  Filled 2017-08-27: qty 50

## 2017-08-27 MED ORDER — SODIUM CHLORIDE 0.9% FLUSH
10.0000 mL | INTRAVENOUS | Status: DC | PRN
  Administered 2017-08-27: 10 mL
  Filled 2017-08-27: qty 10

## 2017-08-27 MED ORDER — PACLITAXEL CHEMO INJECTION 300 MG/50ML
60.0000 mg/m2 | Freq: Once | INTRAVENOUS | Status: AC
  Administered 2017-08-27: 114 mg via INTRAVENOUS
  Filled 2017-08-27: qty 19

## 2017-08-27 MED ORDER — DIPHENHYDRAMINE HCL 50 MG/ML IJ SOLN
25.0000 mg | Freq: Once | INTRAMUSCULAR | Status: AC
  Administered 2017-08-27: 25 mg via INTRAVENOUS

## 2017-08-27 MED ORDER — HEPARIN SOD (PORK) LOCK FLUSH 100 UNIT/ML IV SOLN
500.0000 [IU] | Freq: Once | INTRAVENOUS | Status: AC | PRN
  Administered 2017-08-27: 500 [IU]
  Filled 2017-08-27: qty 5

## 2017-08-27 MED ORDER — FAMOTIDINE IN NACL 20-0.9 MG/50ML-% IV SOLN
20.0000 mg | Freq: Once | INTRAVENOUS | Status: AC
  Administered 2017-08-27: 20 mg via INTRAVENOUS

## 2017-08-27 NOTE — Assessment & Plan Note (Addendum)
03/19/2017: Right lumpectomy: IDC with DCIS, 1.2 cm, grade 2, margins negative, 0/2 lymph nodes negative, ER 100%, PR 10%, HER-2 negative ratio 1.73, Ki-67 40% T1BN0 stage IA CHEK 2 Mutation Positive Oncotype Dx score: 41 (28% ROR with Tamoxifen alone) Treatment plan: 1. Adjuvant chemotherapy with dose dense Adriamycin Cytoxan 4 followed by weekly Taxol 12 2. followed by adjuvant radiation 3. Followed by adjuvant antiestrogen therapy ---------------------------------------------------------------------------- Current treatment: Cycle 12 of weekly Taxol Toxicities: 1. Darkening of the nailbeds 2. Difficulty Sleeping: Recommended she walk daily and continue Lorazepam and Melatonin 3. Facial rash: ? Staph related.  May benefit from doxycycline if happens again.   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  She can have her port out 5. Eye irritation: I recommended lubricating eye gtts four times a day, this is stable  She is now ready to proceed with radiation, and I have requested f/u with Dr. Isidore Moos.  She will return to see Dr. Lindi Adie in 8 weeks to review anti estrogen therapy.  She and I reviewed her pathology in detail today, in addition to her CHEK-2 positivity and recommendations regarding that.  I explained to her that we will go through everything that she has been through from start to finish about three months after she starts anti estrogen therapy. She is looking forward to this.

## 2017-08-27 NOTE — Progress Notes (Signed)
Location of Breast Cancer: Right Breast  Histology per Pathology Report:  02/18/17 Diagnosis 1. 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 - SEE COMMENT. 2. Breast, right, needle core biopsy, 11:30 o'clock, smaller mass - FIBROCYSTIC CHANGES. - THERE IS NO EVIDENCE OF MALIGNANCY.  Receptor Status: ER(100%), PR (10%), Her2-neu (NEG), Ki-(40%)  03/19/17 Diagnosis 1. Breast, lumpectomy, Right - INVASIVE AND IN SITU DUCTAL CARCINOMA, 1.2 CM, MSBR GRADE 2. - MARGINS NOT INVOLVED. - FIBROCYSTIC CHANGES. - TWO PREVIOUS BIOPSY SITES (RIBBON CLIP AND COIL CLIP). 2. Lymph node, sentinel, biopsy, Right axillary - ONE BENIGN LYMPH NODE (0/1). 3. Lymph node, sentinel, biopsy, Right axillary - ONE BENIGN LYMPH NODE (0/1).  Did patient present with symptoms or was this found on screening mammography?: It was found on a screening mammogram.   Past/Anticipated interventions by surgeon, if any: 03/19/17 Procedure: Procedure(s): RIGHT BREAST LUMPECTOMY WITH RADIOACTIVE SEED AND DEEP AXILLARY SENTINEL LYMPH NODE BIOPSY Surgeon: Hoxworth, Benjamin T   Past/Anticipated interventions by medical oncology, if any:  08/27/17 Lindsey Cornetto NP for Dr. Gudena Treatment plan: 1. Adjuvant chemotherapy with dose dense Adriamycin Cytoxan 4 followed by weekly Taxol 12 2. followed by adjuvant radiation 3. Followed by adjuvant antiestrogen therapy ---------------------------------------------------------------------------- Current treatment: Cycle 12of weekly Taxol Toxicities: 1. Darkening of the nailbeds 2. Difficulty Sleeping: Recommended she walk daily and continue Lorazepam and Melatonin 3. Facial rash: ? Staph related.  May benefit from doxycycline if happens again.   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  She can have her port out 5. Eye  irritation: I recommended lubricating eye gtts four times a day, this is stable  She is now ready to proceed with radiation, and I have requested f/u with Dr. Squire.  She will return to see Dr. Gudena in 8 weeks to review anti estrogen therapy.  She and I reviewed her pathology in detail today, in addition to her CHEK-2 positivity and recommendations regarding that.  I explained to her that we will go through everything that she has been through from start to finish about three months after she starts anti estrogen therapy. She is looking forward to this.   Lymphedema issues, if any:  She denies. She has good arm mobility  Pain issues, if any: She reports pain from peripheral neuropathy to her bilateral legs.   SAFETY ISSUES:  Prior radiation? No  Pacemaker/ICD? No  Possible current pregnancy? N/A  Is the patient on methotrexate? No  Current Complaints / other details:    She also reports becoming short of breath with minimal exertion.   BP 130/68   Pulse 77   Temp 98.4 F (36.9 C)   Ht 5' 6.5" (1.689 m)   Wt 174 lb 12.8 oz (79.3 kg)   LMP 08/03/2013   SpO2 99% Comment: room air  BMI 27.79 kg/m    Wt Readings from Last 3 Encounters:  09/01/17 174 lb 12.8 oz (79.3 kg)  08/27/17 176 lb (79.8 kg)  08/13/17 174 lb (78.9 kg)      

## 2017-08-27 NOTE — Patient Instructions (Signed)
Germline mutations in CHEK2 are associated with increased risk for the following cancers: - Breast cancer, 25-39% - Colon cancer, elevated Management guidelines for individuals with pathogenic mutations in CHEK2 have been developed by the [employer]  (NCCN). These guidelines include, but are not limited to: - Annual mammogram with consideration of tomosynthesis; also consider breast MRI with contrast beginning at age 62 (or 10 years prior to the earliest breast cancer diagnosis in the family). - Colonoscopy screening every 5 years beginning by age 30 (or 33 years prior to the age at diagnosis if a first-degree relative had colon cancer before age 7). - If an individual has a personal history of colorectal cancer, screening recommendations should be based on recommendations for post-colorectal cancer resection. Patient should discuss NCCN management guidelines with her health care team to establish a personalized cancer screening/prevention plan based on the presence of her CHEK2 mutation, personal health history, and family history. Patient should also inform at-risk family members of their chance to carry this CHEK2 mutation.

## 2017-08-27 NOTE — Progress Notes (Signed)
[facility] Follow up:    [name], [place] [id number]   DIAGNOSIS: Cancer Staging Carcinoma of upper-outer quadrant of right breast in female, estrogen receptor positive ([place]) Staging form: Breast, AJCC 8th Edition - Clinical: Stage IA (cT1b, cN0, cM0, G2, ER: Positive, PR: Positive, HER2: Negative) - Unsigned - Pathologic: Stage IA (pT1b, pN0, cM0, G2, ER: Positive, PR: Positive, HER2: Negative) - Unsigned   SUMMARY OF ONCOLOGIC HISTORY:   CHEK2-related breast cancer (Rebecca [name])   03/03/2017 Initial Diagnosis    CHEK2-related breast cancer ([place])       Carcinoma of upper-outer quadrant of right breast in female, estrogen receptor positive ([place])   02/18/2017 Initial Diagnosis    Right breast 1 cm mass 8 cm from nipple: biopsy 11:30 position: Grade 2 IDC with DCIS ER 100%, PR 10%, Ki-67 40%, HER-2 negative ratio 1.73; right breast 11:30 position 6 cm from nipple 0.7 cm biopsy: Fibrocystic change; T1b N0 stage IA clinical stage      03/09/2017 Genetic Testing    CHEK2 c.349A>G 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  The STAT Breast cancer panel offered by [facility] includes sequencing and rearrangement analysis for the following 9 genes:  ATM, BRCA1, BRCA2, CDH1, CHEK2, PALB2, PTEN, STK11 and TP53.   The report date is March 09, 2017.  Re-requitisioned to the common hereditary cancer panel and the melanoma panel.  The additional genes tested were negative.  The Hereditary Gene Panel offered by [facility] includes sequencing and/or deletion duplication testing of the following 46 genes: APC, ATM, AXIN2, BARD1, BMPR1A, BRCA1, BRCA2, BRIP1, CDH1, CDKN2A (p14ARF), CDKN2A (p16INK4a), CHEK2, CTNNA1, DICER1, EPCAM (Deletion/duplication testing only), GREM1 (promoter region deletion/duplication testing only), KIT, MEN1, MLH1, MSH2, MSH3, MSH6, MUTYH, NBN, NF1, NHTL1, PALB2, PDGFRA, PMS2, POLD1, POLE, PTEN, RAD50, RAD51C, RAD51D, SDHB,  SDHC, SDHD, SMAD4, SMARCA4. STK11, TP53, TSC1, TSC2, and VHL.  The following genes were evaluated for sequence changes only: SDHA and HOXB13 c.251G>A variant only.  The Melanoma panel offered by [facility] includes sequencing and/or deletion duplication testing of the following 12 genes: BAP1, BRCA1, BRCA2, BRIP1, CDK4, CDKN2A (p14ARF), CDKN2A (p16INK4a), MC1R, POT1, PTEN, RB1, TERT, and TP53.  The following gene was evaluated for sequence changes only: MITF (c.952G>A, p.GLU318Lys variant only). The updated report date is March 14, 2017.        03/19/2017 Surgery    Right lumpectomy: IDC with DCIS, 1.2 cm, grade 2, margins negative, 0/2 lymph nodes negative, ER 100%, PR 10%, HER-2 negative ratio 1.73, Ki-67 40% T1BN0 stage IA      04/01/2017 Oncotype testing    Oncotype Dx score: 41 (28% ROR with Tamoxifen alone)      04/16/2017 -  Chemotherapy    Dose dense Adriamycin and Cytoxan 4 followed by Taxol weekly 12        CURRENT THERAPY: Taxol week 12  INTERVAL HISTORY: [name] 62 y.o. female returns for follow up and evaluation prior to her last chemotherapy with Taxol.  She continues to tolerate it well.  Since her dose reduction, she has had some mild intermittent peripheral neuropathy, but otherwise is doing well.  She denies any motor deficits.    Patient Active Problem List   Diagnosis Date Noted  . Port catheter in place 04/23/2017  . Genetic testing 03/10/2017  . Carcinoma of upper-outer quadrant of right breast in female, estrogen receptor positive (Rebecca [name]) 03/05/2017  . CHEK2-related breast cancer ([place]) 03/03/2017  . Family history  of breast cancer   . Family history of melanoma   . Family history of colon cancer   . Family history of brain cancer   . Routine general medical examination at a health care facility 03/20/2016  . Migraine 08/24/2015  . Hyperlipidemia 07/02/2013  . Vitamin D deficiency 07/02/2013    is allergic to citalopram; citalopram; dexamethasone;  dexamethasone; dilaudid [hydromorphone hcl]; propoxycaine; red yeast rice [cholestin]; rofecoxib; and sulfamethoxazole.  MEDICAL HISTORY: Past Medical History:  Diagnosis Date  . Depression    REMISSION/ SITUATIONAL  . Dyspnea   . Family history of brain cancer   . Family history of breast cancer   . Family history of colon cancer   . Family history of melanoma   . Hepatitis AGE 37   RESOLVED  . Hyperlipidemia   . Insomnia   . Migraines    HORMONAL  . Shingles outbreak 2003, 2015   RIGHT HIP, and between breasts 3 years ago    SURGICAL HISTORY: Past Surgical History:  Procedure Laterality Date  . BREAST LUMPECTOMY WITH RADIOACTIVE SEED AND SENTINEL LYMPH NODE BIOPSY Right 03/19/2017   Procedure: RIGHT BREAST LUMPECTOMY WITH RADIOACTIVE SEED AND SENTINEL LYMPH NODE BIOPSY;  Surgeon: Excell Seltzer, MD;  Location: [facility];  Service: General;  Laterality: Right;  . KNEE ARTHROSCOPY Right 2002  . TUBAL LIGATION    . uterine polyps  2016    SOCIAL HISTORY: Social History   Socioeconomic History  . Marital status: Married    Spouse name: Not on file  . Number of children: Not on file  . Years of education: Not on file  . Highest education level: Not on file  Social Needs  . Financial resource strain: Not on file  . Food insecurity - worry: Not on file  . Food insecurity - inability: Not on file  . Transportation needs - medical: Not on file  . Transportation needs - non-medical: Not on file  Occupational History  . Not on file  Tobacco Use  . Smoking status: Former Smoker    Last attempt to quit: 08/26/2008    Years since quitting: 9.0  . Smokeless tobacco: Never Used  Substance and Sexual Activity  . Alcohol use: No    Comment: OCCASIONAL  . Drug use: No  . Sexual activity: Yes    Birth control/protection: None  Other Topics Concern  . Not on file  Social History Narrative   ** Merged History Encounter **        FAMILY HISTORY: Family  History  Problem Relation Age of Onset  . Depression Brother   . Lung cancer Brother 39       maternal half brother  . Dementia Mother        Delman Kitten  . Cancer Father 31       MELANOMA CAUSED DEATH/melanoma  . Cancer Brother        multiple myeloma/lung - maternal half brother  . Stroke Maternal Grandmother 80  . Cancer Sister 46       breast- deceased at 28 - 1/2 sister  . Breast cancer Sister   . Heart disease Maternal Grandfather   . Cancer Paternal Grandfather        COLON  . Cervical cancer Maternal Aunt   . Lung cancer Maternal Uncle   . Other Paternal Grandmother        died in childbirth  . Dementia Maternal Aunt     Review of Systems  Constitutional: Negative  for appetite change, chills, fatigue and fever.  HENT:   Negative for hearing loss, lump/mass and trouble swallowing.   Eyes: Negative for eye problems and icterus.  Respiratory: Negative for chest tightness, cough and shortness of breath.   Cardiovascular: Negative for chest pain, leg swelling and palpitations.  Gastrointestinal: Negative for abdominal distention, abdominal pain, constipation, diarrhea, nausea and vomiting.  Endocrine: Negative for hot flashes.  Musculoskeletal: Negative for arthralgias.  Skin: Negative for itching and rash.      PHYSICAL EXAMINATION  ECOG PERFORMANCE STATUS: 1 - Symptomatic but completely ambulatory  Vitals:   08/27/17 0845  BP: 138/72  Pulse: 77  Resp: 18  Temp: 98 F (36.7 C)  SpO2: 100%    Physical Exam  Constitutional: She is oriented to person, place, and time and well-developed, well-nourished, and in no distress.  HENT:  Head: Normocephalic and atraumatic.  Mouth/Throat: Oropharynx is clear and moist. No oropharyngeal exudate.  Eyes: Pupils are equal, round, and reactive to light. No scleral icterus.  Neck: Neck supple.  Cardiovascular: Normal rate, regular rhythm and normal heart sounds.  Pulmonary/Chest: Effort normal and breath sounds normal. No  respiratory distress. She has no wheezes. She has no rales.  Abdominal: Soft. Bowel sounds are normal. She exhibits no distension and no mass. There is no tenderness. There is no rebound and no guarding.  Musculoskeletal: She exhibits no edema.  Lymphadenopathy:    She has no cervical adenopathy.  Neurological: She is alert and oriented to person, place, and time.  Skin: Skin is warm and dry. No rash noted.  Psychiatric: Mood and affect normal.    LABORATORY DATA:  CBC    Component Value Date/Time   WBC 3.6 (L) 08/27/2017 0805   WBC 4.9 04/07/2017 0931   RBC 3.52 (L) 08/27/2017 0805   RBC 4.50 04/07/2017 0931   HGB 11.4 (L) 08/27/2017 0805   HCT 33.6 (L) 08/27/2017 0805   PLT 218 08/27/2017 0805   MCV 95.6 08/27/2017 0805   MCH 32.4 08/27/2017 0805   MCH 30.4 04/07/2017 0931   MCHC 33.9 08/27/2017 0805   MCHC 33.7 04/07/2017 0931   RDW 13.6 08/27/2017 0805   LYMPHSABS 0.7 (L) 08/27/2017 0805   MONOABS 0.4 08/27/2017 0805   EOSABS 0.0 08/27/2017 0805   BASOSABS 0.1 08/27/2017 0805    CMP     Component Value Date/Time   NA 139 08/27/2017 0805   K 4.0 08/27/2017 0805   CL 105 04/07/2017 0931   CO2 25 08/27/2017 0805   GLUCOSE 80 08/27/2017 0805   BUN 14.3 08/27/2017 0805   CREATININE 0.7 08/27/2017 0805   CALCIUM 8.8 08/27/2017 0805   PROT 6.4 08/27/2017 0805   ALBUMIN 3.7 08/27/2017 0805   AST 43 (H) 08/27/2017 0805   ALT 71 (H) 08/27/2017 0805   ALKPHOS 56 08/27/2017 0805   BILITOT 0.62 08/27/2017 0805   GFRNONAA >89 04/07/2017 0931   GFRAA >89 04/07/2017 0931      ASSESSMENT and PLAN:   Carcinoma of upper-outer quadrant of right breast in female, estrogen receptor positive (Rebecca [name]) 03/19/2017: Right lumpectomy: IDC with DCIS, 1.2 cm, grade 2, margins negative, 0/2 lymph nodes negative, ER 100%, PR 10%, HER-2 negative ratio 1.73, Ki-67 40% T1BN0 stage IA CHEK 2 Mutation Positive Oncotype Dx score: 41 (28% ROR with Tamoxifen alone) Treatment plan: 1.  Adjuvant chemotherapy with dose dense Adriamycin Cytoxan 4 followed by weekly Taxol 12 2. followed by adjuvant radiation 3. Followed by adjuvant antiestrogen therapy ----------------------------------------------------------------------------  Current treatment: Cycle 12 of weekly Taxol Toxicities: 1. Darkening of the nailbeds 2. Difficulty Sleeping: Recommended she walk daily and continue Lorazepam and Melatonin 3. Facial rash: ? Staph related.  May benefit from doxycycline if happens again.   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  She can have her port out 5. Eye irritation: I recommended lubricating eye gtts four times a day, this is stable  She is now ready to proceed with radiation, and I have requested f/u with Dr. Isidore Moos.  She will return to see Dr. Lindi Adie in 8 weeks to review anti estrogen therapy.  She and I reviewed her pathology in detail today, in addition to her CHEK-2 positivity and recommendations regarding that.  I explained to her that we will go through everything that she has been through from start to finish about three months after she starts anti estrogen therapy. She is looking forward to this.   Orders Placed This Encounter  Procedures  . Ambulatory referral to Radiation Oncology    Referral Priority:   Routine    Referral Type:   Consultation    Referral Reason:   Specialty Services Required    Referred to Provider:   Eppie Gibson, MD    Requested Specialty:   Radiation Oncology    Number of Visits Requested:   1    All questions were answered. The patient knows to call the clinic with any problems, questions or concerns. We can certainly see the patient much sooner if necessary.  A total of (30) minutes of face-to-face time was spent with this patient with greater than 50% of that time in counseling and care-coordination.  This note was electronically signed. Scot Dock,  NP 08/27/2017

## 2017-08-27 NOTE — Telephone Encounter (Signed)
Patient scheduled per 1/2 los. Patient declined AVS and calendar. Patient will receive update in MyChart.

## 2017-09-01 ENCOUNTER — Telehealth: Payer: Self-pay

## 2017-09-01 ENCOUNTER — Encounter: Payer: Self-pay | Admitting: Radiation Oncology

## 2017-09-01 ENCOUNTER — Ambulatory Visit
Admission: RE | Admit: 2017-09-01 | Discharge: 2017-09-01 | Disposition: A | Discharge status: Home / Self Care | Payer: BLUE CROSS/BLUE SHIELD | Source: Ambulatory Visit | Attending: Radiation Oncology | Admitting: Radiation Oncology

## 2017-09-01 ENCOUNTER — Other Ambulatory Visit: Payer: Self-pay

## 2017-09-01 VITALS — BP 130/68 | HR 77 | Temp 98.4°F | Ht 66.5 in | Wt 174.8 lb

## 2017-09-01 DIAGNOSIS — C50411 Malignant neoplasm of upper-outer quadrant of right female breast: Secondary | ICD-10-CM

## 2017-09-01 DIAGNOSIS — R0602 Shortness of breath: Secondary | ICD-10-CM | POA: Insufficient documentation

## 2017-09-01 DIAGNOSIS — Z79899 Other long term (current) drug therapy: Secondary | ICD-10-CM | POA: Diagnosis not present

## 2017-09-01 DIAGNOSIS — Z170 Estrogen receptor positive status [ER+]: Secondary | ICD-10-CM | POA: Insufficient documentation

## 2017-09-01 DIAGNOSIS — Z510 Encounter for antineoplastic radiation therapy: Secondary | ICD-10-CM | POA: Diagnosis not present

## 2017-09-01 DIAGNOSIS — R21 Rash and other nonspecific skin eruption: Secondary | ICD-10-CM | POA: Insufficient documentation

## 2017-09-01 MED ORDER — TRAZODONE HCL 50 MG PO TABS: ORAL_TABLET | ORAL | 2 refills | Status: DC

## 2017-09-01 NOTE — Progress Notes (Signed)
Radiation Oncology         (336) [phone] ________________________________  Name: Rebecca [name] MRN: [id number]  Date: 09/01/2017  DOB: 06-15-1956  Follow-Up Visit Note  Outpatient  CC: [name], MD  Wilber Bihari [facility]*  Diagnosis:      ICD-10-CM   1. Carcinoma of upper-outer quadrant of right breast in female, estrogen receptor positive ([place]) C50.411    Z17.0    Cancer Staging Carcinoma of upper-outer quadrant of right breast in female, estrogen receptor positive ([place]) Staging form: Breast, AJCC 8th Edition - Clinical: Stage IA (cT1b, cN0, cM0, G2, ER: Positive, PR: Positive, HER2: Negative) - Unsigned - Pathologic: Stage IA (pT1c, pN0, cM0, G2, ER: Positive, PR: Positive, HER2: Negative) - Signed by Eppie Gibson, MD on 09/02/2017   CHIEF COMPLAINT: Here to discuss management of right breast cancer  Narrative:  The patient returns today for follow-up.     Since consultation, she underwent lumpectomy and SLN biopsy on 03-19-17 revealing a 1.2 cm tumor, grade 2 IDC with high grade DCIS, negative margins by at least 1cm to in situ and invasive disease. Both (2) nodes were negative. Oncotype DX score was 41 and she proceeded to receive chemotherapy (AC x 4 dose dense --> weekly taxol).  She completed this within the past week.    She reports SOB with exertion and denies lymphedema.  She has good arm mobility,  She has pain r/t peripheral neuropathy in the bilateral legs. Eye irritation r/t chemotherapy.  Recent facial rash.  Darkening of nailbeds.  Genetic testing revealed CHEK2 positivity.           ALLERGIES:  is allergic to citalopram; citalopram; dexamethasone; dexamethasone; dilaudid [hydromorphone hcl]; propoxycaine; red yeast rice [cholestin]; rofecoxib; and sulfamethoxazole.  Meds: Current Outpatient Medications  Medication Sig Dispense Refill  . ibuprofen (ADVIL,MOTRIN) 200 MG tablet Take 200 mg by mouth every 6 (six) hours as needed for mild pain.    Marland Kitchen  lidocaine-prilocaine (EMLA) cream Apply to affected area once 30 g 3  . LORazepam (ATIVAN) 0.5 MG tablet Take 1 tablet (0.5 mg total) by mouth at bedtime. As needed 30 tablet 1  . ondansetron (ZOFRAN) 8 MG tablet Take 1 tablet (8 mg total) by mouth 2 (two) times daily as needed. Start on the third day after chemotherapy. 30 tablet 1  . prochlorperazine (COMPAZINE) 10 MG tablet Take 1 tablet (10 mg total) by mouth every 6 (six) hours as needed (Nausea or vomiting). 30 tablet 1  . traZODone (DESYREL) 50 MG tablet 1/2-1 tablet for sleep 30 tablet 2   No current facility-administered medications for this encounter.     Physical Findings:  height is 5' 6.5" (1.689 m) and weight is 174 lb 12.8 oz (79.3 kg). Her temperature is 98.4 F (36.9 C). Her blood pressure is 130/68 and her pulse is 77. Her oxygen saturation is 99%. .     General: Alert and oriented, in no acute distress HEENT: Head is normocephalic. Extraocular movements are intact.   Extremities: No cyanosis or edema in UEs. Musculoskeletal: good ROM in UEs Neurologic: No obvious focalities. Speech is fluent.  Psychiatric: Judgment and insight are intact. Affect is appropriate. Breast exam reveals excellent healing of lumpectomy/axillary scars of right breast Vasc: Left upper chest port a cath  Lab Findings: Lab Results  Component Value Date   WBC 3.6 (L) 08/27/2017   HGB 11.4 (L) 08/27/2017   HCT 33.6 (L) 08/27/2017   MCV 95.6 08/27/2017   PLT 218 08/27/2017  Radiographic Findings: No results found.  Impression/Plan: We discussed adjuvant radiotherapy today.  I recommend adjuvant radiotherapy to the right breast in order to reduce risk of locoregional recurrence by 2/3.  The risks, benefits and side effects of this treatment were discussed in detail.  She understands that radiotherapy is associated with skin irritation and fatigue in the acute setting. Late effects can include cosmetic changes and rare injury to internal  organs.   She is enthusiastic about proceeding with treatment. A consent form has been signed and placed in her chart.  A total of 3 medically necessary complex treatment devices will be fabricated and supervised by me: 2 fields with MLCs for custom blocks to protect heart, and lungs;  and, a Vac-lok. MORE COMPLEX DEVICES MAY BE MADE IN DOSIMETRY FOR FIELD IN FIELD BEAMS FOR DOSE HOMOGENEITY.  I have requested : 3D Simulation which is medically necessary to give adequate dose to at risk tissues while sparing lungs and heart.  I have requested a DVH of the following structures: lungs, heart, right lumpectomy cavity.    The patient will receive 42.56 Gy in 16 fractions to the right breast with 2 fields.  This will not be followed by a boost.  She will contact med/onc about port removal. I will order simulation to be done in 2 wks to allow this to take place, if allowed.  _____________________________________   Eppie Gibson, MD

## 2017-09-01 NOTE — Telephone Encounter (Signed)
Pt called requesting for port removal. Pt had met with radiation today and was advised that it would be best for her to have port removed either this week or next week prior to starting radonc tx. Per Dr.Gudena, okay for pt to have port removal prior to starting radiation. Will contact Dr.Hoxworth and see if pt can be scheduled for this week or next week (port removal). Pt also advised to call surgeon's office to notify them that this was okay to be done. Pt very appreciative and has no further needs at this time.

## 2017-09-02 ENCOUNTER — Encounter: Payer: Self-pay | Admitting: Radiation Oncology

## 2017-09-10 ENCOUNTER — Other Ambulatory Visit: Payer: Self-pay

## 2017-09-10 MED ORDER — TRAZODONE HCL 50 MG PO TABS: ORAL_TABLET | ORAL | 0 refills | Status: DC

## 2017-09-16 ENCOUNTER — Ambulatory Visit: Payer: BLUE CROSS/BLUE SHIELD | Admitting: Radiation Oncology

## 2017-09-16 ENCOUNTER — Ambulatory Visit
Admission: RE | Admit: 2017-09-16 | Discharge: 2017-09-16 | Disposition: A | Discharge status: Home / Self Care | Payer: BLUE CROSS/BLUE SHIELD | Source: Ambulatory Visit | Attending: Radiation Oncology | Admitting: Radiation Oncology

## 2017-09-16 DIAGNOSIS — C50411 Malignant neoplasm of upper-outer quadrant of right female breast: Secondary | ICD-10-CM | POA: Diagnosis not present

## 2017-09-16 DIAGNOSIS — Z170 Estrogen receptor positive status [ER+]: Secondary | ICD-10-CM

## 2017-09-16 NOTE — Progress Notes (Signed)
  Radiation Oncology         [phone] ________________________________  Name: Rebecca [name] MRN: [id number]  Date: 09/16/2017  DOB: Jun 22, 1956  SIMULATION AND TREATMENT PLANNING NOTE    Outpatient  DIAGNOSIS:     ICD-10-CM   1. Carcinoma of upper-outer quadrant of right breast in female, estrogen receptor positive ([place]) C50.411    Z17.0     NARRATIVE:  The patient was brought to the [facility].  Identity was confirmed.  All relevant records and images related to the planned course of therapy were reviewed.  The patient freely provided informed written consent to proceed with treatment after reviewing the details related to the planned course of therapy. The consent form was witnessed and verified by the simulation staff.    Then, the patient was set-up in a stable reproducible supine position for radiation therapy with her ipsilateral arm over her head, and her upper body secured in a custom-made Vac-lok device.  CT images were obtained.  Surface markings were placed.  The CT images were loaded into the planning software.    TREATMENT PLANNING NOTE: Treatment planning then occurred.  The radiation prescription was entered and confirmed.     A total of 3 medically necessary complex treatment devices were fabricated and supervised by me: 2 fields with MLCs for custom blocks to protect heart, and lungs;  and, a Vac-lok. MORE COMPLEX DEVICES MAY BE MADE IN DOSIMETRY FOR FIELD IN FIELD BEAMS FOR DOSE HOMOGENEITY.  I have requested : 3D Simulation which is medically necessary to give adequate dose to at risk tissues while sparing lungs and heart.  I have requested a DVH of the following structures: lungs, heart, right lumpectomy cavity.    The patient will receive 42.56 Gy in 16 fractions to the right breast with 2 tangential fields.   This will not be followed by a boost.  Optical Surface Tracking Plan:  Since intensity modulated radiotherapy (IMRT) and 3D conformal  radiation treatment methods are predicated on accurate and precise positioning for treatment, intrafraction motion monitoring is medically necessary to ensure accurate and safe treatment delivery. The ability to quantify intrafraction motion without excessive ionizing radiation dose can only be performed with optical surface tracking. Accordingly, surface imaging offers the opportunity to obtain 3D measurements of patient position throughout IMRT and 3D treatments without excessive radiation exposure. I am ordering optical surface tracking for this patient's upcoming course of radiotherapy.  ________________________________   Reference:  Ursula Alert, J, et al. Surface imaging-based analysis of intrafraction motion for breast radiotherapy patients.Journal of [place], n. 6, nov. 2014. ISSN [id number].  Available at: <http://www.jacmp.org/index.php/jacmp/article/view/4957>.    -----------------------------------  Eppie Gibson, MD

## 2017-09-17 DIAGNOSIS — C50411 Malignant neoplasm of upper-outer quadrant of right female breast: Secondary | ICD-10-CM | POA: Diagnosis not present

## 2017-09-19 ENCOUNTER — Encounter: Payer: Self-pay | Admitting: General Practice

## 2017-09-19 NOTE — Progress Notes (Signed)
[facility] CSW Progress Note  Email to [place] from [employer] - recent check to [place] has been returned.  Checked w patient who provided new address for [employer] - [phone], [place] [address].  [place] updated via email.  Edwyna Shell, LCSW Clinical Social Worker Phone:  [phone]

## 2017-09-22 ENCOUNTER — Telehealth: Payer: Self-pay

## 2017-09-22 NOTE — Telephone Encounter (Signed)
Returned pts call regarding lower back pain and swelling in lower extremities. Pt stated it feels like bone pain in her back so I suggested for her to try taking Claritin for that. Explained to her the swelling is a side effect of the Taxol and can last for several weeks after receiving. She also complained of the tingling/numbness in hands and feet. Told pt if that gets worse for her then to call us back and we can try gabapentin for her. Pt is to call if symptoms do not improve.  Cyndia Bent RN

## 2017-09-24 ENCOUNTER — Telehealth: Payer: Self-pay

## 2017-09-24 ENCOUNTER — Ambulatory Visit
Admission: RE | Admit: 2017-09-24 | Discharge: 2017-09-24 | Disposition: A | Discharge status: Home / Self Care | Payer: BLUE CROSS/BLUE SHIELD | Source: Ambulatory Visit | Attending: Radiation Oncology | Admitting: Radiation Oncology

## 2017-09-24 ENCOUNTER — Other Ambulatory Visit: Payer: Self-pay

## 2017-09-24 ENCOUNTER — Inpatient Hospital Stay: Payer: BLUE CROSS/BLUE SHIELD | Attending: Hematology and Oncology

## 2017-09-24 DIAGNOSIS — Z170 Estrogen receptor positive status [ER+]: Secondary | ICD-10-CM

## 2017-09-24 DIAGNOSIS — C50411 Malignant neoplasm of upper-outer quadrant of right female breast: Secondary | ICD-10-CM | POA: Diagnosis not present

## 2017-09-24 LAB — URINALYSIS, COMPLETE (UACMP) WITH MICROSCOPIC
Bacteria, UA: NONE SEEN
Bilirubin Urine: NEGATIVE
Glucose, UA: NEGATIVE mg/dL
Ketones, ur: NEGATIVE mg/dL
Leukocytes, UA: NEGATIVE
Nitrite: NEGATIVE
PH: 6 (ref 5.0–8.0)
Protein, ur: NEGATIVE mg/dL
SPECIFIC GRAVITY, URINE: 1.006 (ref 1.005–1.030)
SQUAMOUS EPITHELIAL / LPF: NONE SEEN

## 2017-09-24 NOTE — Telephone Encounter (Signed)
Called pt regarding UA results. Urinalysis was clear. Concerning her back pain I told her Dr. Lindi Adie said we could order her tramadol. She did not want this at this time. She is going to try Advil. No further questions at this time.  Cyndia Bent RN

## 2017-09-25 ENCOUNTER — Other Ambulatory Visit: Payer: Self-pay

## 2017-09-25 ENCOUNTER — Ambulatory Visit
Admission: RE | Admit: 2017-09-25 | Discharge: 2017-09-25 | Disposition: A | Discharge status: Home / Self Care | Payer: BLUE CROSS/BLUE SHIELD | Source: Ambulatory Visit | Attending: Radiation Oncology | Admitting: Radiation Oncology

## 2017-09-25 DIAGNOSIS — C50411 Malignant neoplasm of upper-outer quadrant of right female breast: Secondary | ICD-10-CM

## 2017-09-25 DIAGNOSIS — Z170 Estrogen receptor positive status [ER+]: Secondary | ICD-10-CM

## 2017-09-25 MED ORDER — ALRA NON-METALLIC DEODORANT (RAD-ONC)
1.0000 "application " | Freq: Once | TOPICAL | Status: AC
  Administered 2017-09-25: 1 via TOPICAL

## 2017-09-25 MED ORDER — TRAMADOL HCL 50 MG PO TABS: 50.0000 mg | ORAL_TABLET | Freq: Two times a day (BID) | ORAL | 0 refills | Status: DC

## 2017-09-25 MED ORDER — RADIAPLEXRX EX GEL
Freq: Once | CUTANEOUS | Status: AC
  Administered 2017-09-25: 14:00:00 via TOPICAL

## 2017-09-25 NOTE — Progress Notes (Signed)

## 2017-09-25 NOTE — Telephone Encounter (Signed)
Called pt to inform her I received her VM and I have faxed a script to her pharmacy for tramadol. Instructions on how to take given. Patient to call back if symptoms do not improve.  Cyndia Bent RN

## 2017-09-26 ENCOUNTER — Encounter: Payer: Self-pay | Admitting: *Deleted

## 2017-09-26 ENCOUNTER — Ambulatory Visit
Admission: RE | Admit: 2017-09-26 | Discharge: 2017-09-26 | Disposition: A | Discharge status: Home / Self Care | Payer: BLUE CROSS/BLUE SHIELD | Source: Ambulatory Visit | Attending: Radiation Oncology | Admitting: Radiation Oncology

## 2017-09-26 DIAGNOSIS — C50411 Malignant neoplasm of upper-outer quadrant of right female breast: Secondary | ICD-10-CM | POA: Diagnosis not present

## 2017-09-29 ENCOUNTER — Ambulatory Visit
Admission: RE | Admit: 2017-09-29 | Discharge: 2017-09-29 | Disposition: A | Discharge status: Home / Self Care | Payer: BLUE CROSS/BLUE SHIELD | Source: Ambulatory Visit | Attending: Radiation Oncology | Admitting: Radiation Oncology

## 2017-09-29 ENCOUNTER — Ambulatory Visit: Payer: BLUE CROSS/BLUE SHIELD

## 2017-09-29 DIAGNOSIS — C50411 Malignant neoplasm of upper-outer quadrant of right female breast: Secondary | ICD-10-CM | POA: Diagnosis not present

## 2017-09-30 ENCOUNTER — Ambulatory Visit
Admission: RE | Admit: 2017-09-30 | Discharge: 2017-09-30 | Disposition: A | Discharge status: Home / Self Care | Payer: BLUE CROSS/BLUE SHIELD | Source: Ambulatory Visit | Attending: Radiation Oncology | Admitting: Radiation Oncology

## 2017-09-30 DIAGNOSIS — C50411 Malignant neoplasm of upper-outer quadrant of right female breast: Secondary | ICD-10-CM | POA: Diagnosis not present

## 2017-10-01 ENCOUNTER — Ambulatory Visit
Admission: RE | Admit: 2017-10-01 | Discharge: 2017-10-01 | Disposition: A | Discharge status: Home / Self Care | Payer: BLUE CROSS/BLUE SHIELD | Source: Ambulatory Visit | Attending: Radiation Oncology | Admitting: Radiation Oncology

## 2017-10-01 DIAGNOSIS — C50411 Malignant neoplasm of upper-outer quadrant of right female breast: Secondary | ICD-10-CM | POA: Diagnosis not present

## 2017-10-02 ENCOUNTER — Telehealth: Payer: Self-pay

## 2017-10-02 ENCOUNTER — Ambulatory Visit
Admission: RE | Admit: 2017-10-02 | Discharge: 2017-10-02 | Disposition: A | Discharge status: Home / Self Care | Payer: BLUE CROSS/BLUE SHIELD | Source: Ambulatory Visit | Attending: Radiation Oncology | Admitting: Radiation Oncology

## 2017-10-02 ENCOUNTER — Inpatient Hospital Stay (HOSPITAL_BASED_OUTPATIENT_CLINIC_OR_DEPARTMENT_OTHER): Payer: BLUE CROSS/BLUE SHIELD | Admitting: Adult Health

## 2017-10-02 ENCOUNTER — Other Ambulatory Visit: Payer: Self-pay | Admitting: Adult Health

## 2017-10-02 ENCOUNTER — Inpatient Hospital Stay: Payer: BLUE CROSS/BLUE SHIELD | Attending: Hematology and Oncology

## 2017-10-02 ENCOUNTER — Encounter: Payer: Self-pay | Admitting: Adult Health

## 2017-10-02 ENCOUNTER — Ambulatory Visit (HOSPITAL_COMMUNITY)
Admission: RE | Admit: 2017-10-02 | Discharge: 2017-10-02 | Disposition: A | Discharge status: Home / Self Care | Payer: BLUE CROSS/BLUE SHIELD | Source: Ambulatory Visit | Attending: Adult Health | Admitting: Adult Health

## 2017-10-02 ENCOUNTER — Other Ambulatory Visit: Payer: Self-pay

## 2017-10-02 VITALS — BP 120/65 | HR 80 | Temp 98.2°F | Resp 17 | Ht 66.5 in | Wt 173.1 lb

## 2017-10-02 DIAGNOSIS — M5136 Other intervertebral disc degeneration, lumbar region: Secondary | ICD-10-CM | POA: Insufficient documentation

## 2017-10-02 DIAGNOSIS — Z79811 Long term (current) use of aromatase inhibitors: Secondary | ICD-10-CM | POA: Diagnosis not present

## 2017-10-02 DIAGNOSIS — M545 Low back pain, unspecified: Secondary | ICD-10-CM

## 2017-10-02 DIAGNOSIS — N200 Calculus of kidney: Secondary | ICD-10-CM

## 2017-10-02 DIAGNOSIS — C50411 Malignant neoplasm of upper-outer quadrant of right female breast: Secondary | ICD-10-CM

## 2017-10-02 DIAGNOSIS — Z170 Estrogen receptor positive status [ER+]: Secondary | ICD-10-CM

## 2017-10-02 DIAGNOSIS — M4184 Other forms of scoliosis, thoracic region: Secondary | ICD-10-CM | POA: Diagnosis not present

## 2017-10-02 LAB — CBC WITH DIFFERENTIAL (CANCER CENTER ONLY)
BASOS ABS: 0 10*3/uL (ref 0.0–0.1)
BASOS PCT: 1 %
EOS ABS: 0.1 10*3/uL (ref 0.0–0.5)
EOS PCT: 2 %
HCT: 38.7 % (ref 34.8–46.6)
Hemoglobin: 12.8 g/dL (ref 11.6–15.9)
LYMPHS PCT: 26 %
Lymphs Abs: 1.3 10*3/uL (ref 0.9–3.3)
MCH: 30.9 pg (ref 25.1–34.0)
MCHC: 33.1 g/dL (ref 31.5–36.0)
MCV: 93.5 fL (ref 79.5–101.0)
MONO ABS: 0.4 10*3/uL (ref 0.1–0.9)
Monocytes Relative: 8 %
Neutro Abs: 3.2 10*3/uL (ref 1.5–6.5)
Neutrophils Relative %: 63 %
PLATELETS: 178 10*3/uL (ref 145–400)
RBC: 4.14 MIL/uL (ref 3.70–5.45)
RDW: 13.1 % (ref 11.2–14.5)
WBC: 5.1 10*3/uL (ref 3.9–10.3)

## 2017-10-02 LAB — CMP (CANCER CENTER ONLY)
ALBUMIN: 3.8 g/dL (ref 3.5–5.0)
ALK PHOS: 70 U/L (ref 40–150)
ALT: 31 U/L (ref 0–55)
AST: 22 U/L (ref 5–34)
Anion gap: 8 (ref 3–11)
BILIRUBIN TOTAL: 0.9 mg/dL (ref 0.2–1.2)
BUN: 16 mg/dL (ref 7–26)
CALCIUM: 8.9 mg/dL (ref 8.4–10.4)
CO2: 26 mmol/L (ref 22–29)
CREATININE: 0.67 mg/dL (ref 0.60–1.10)
Chloride: 103 mmol/L (ref 98–109)
GFR, Est AFR Am: >60 mL/min (ref >60)
GLUCOSE: 96 mg/dL (ref 70–140)
POTASSIUM: 4.1 mmol/L (ref 3.5–5.1)
Sodium: 137 mmol/L (ref 136–145)
TOTAL PROTEIN: 6.7 g/dL (ref 6.4–8.3)

## 2017-10-02 MED ORDER — TRAMADOL HCL 50 MG PO TABS: 50.0000 mg | ORAL_TABLET | Freq: Two times a day (BID) | ORAL | 0 refills | Status: DC

## 2017-10-02 NOTE — Assessment & Plan Note (Signed)
03/19/2017: Right lumpectomy: IDC with DCIS, 1.2 cm, grade 2, margins negative, 0/2 lymph nodes negative, ER 100%, PR 10%, HER-2 negative ratio 1.73, Ki-67 40% T1BN0 stage IA CHEK 2 Mutation Positive Oncotype Dx score: 41 (28% ROR with Tamoxifen alone) Treatment plan: 1. Adjuvant chemotherapy with dose dense Adriamycin Cytoxan 4 followed by weekly Taxol 12 2. followed by adjuvant radiation 3. Followed by adjuvant antiestrogen therapy ---------------------------------------------------------------------------- Current treatment: Adjuvant radiation  Rebecca [name] is here today for acute low back pain that has been persistent x 3 weeks.  The tramadol is controlling her pain and I will refill this.  However, at this point we need to image her and ensure that we aren't missing anything.  I recommended that we start with thoracic and lumbar plain films.  If negative, since her pain isn't exactly over her spine, and could possibly be referred pain, we will get a CT A/P.  I reviewed this with her.  I recommended she take the tramadol when needed and also try Yoga.  We will f/u with her about the results.

## 2017-10-02 NOTE — Telephone Encounter (Signed)
Patient referred to [facility] today. Copy of NP note and X-rays faxed over per request [facility].  Appt is set for tomorrow at 9:30 am.  Patient is aware of appt date/time.

## 2017-10-02 NOTE — Telephone Encounter (Signed)
Pt calling to notify Dr.Gudena that she had been having flank pain (around her kidneys). She recently had a clear UA a week ago. Pt had been taking ultram BID as instructed, which eventually works, but the pain comes back intense. Pt is coming in today for radiation and would like to have MD see her to assess her pain and symptoms.   Notified patient that Dr.Gudena is out today and will discuss pt concerns with Mendel Ryder, NP.   Per Mendel Ryder, okay to add pt for an appt today, with labs. Gave pt schedule for today and confirmed time/date.

## 2017-10-02 NOTE — Progress Notes (Signed)
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  The STAT Breast cancer panel offered by [facility] includes sequencing and rearrangement analysis for the following 9 genes:  ATM, BRCA1, BRCA2, CDH1, CHEK2, PALB2, PTEN, STK11 and TP53.   The report date is March 09, 2017.  Re-requitisioned to the common hereditary cancer panel and the melanoma panel.  The additional genes tested were negative.  The Hereditary Gene Panel offered by [facility] includes sequencing and/or deletion duplication testing of the following 46 genes: APC, ATM, AXIN2, BARD1, BMPR1A, BRCA1, BRCA2, BRIP1, CDH1, CDKN2A (p14ARF), CDKN2A (p16INK4a), CHEK2, CTNNA1, DICER1, EPCAM (Deletion/duplication testing only), GREM1 (promoter region deletion/duplication testing only), KIT, MEN1, MLH1, MSH2, MSH3, MSH6, MUTYH, NBN, NF1, NHTL1, PALB2, PDGFRA, PMS2, POLD1, POLE,  PTEN, RAD50, RAD51C, RAD51D, SDHB, SDHC, SDHD, SMAD4, SMARCA4. STK11, TP53, TSC1, TSC2, and VHL.  The following genes were evaluated for sequence changes only: SDHA and HOXB13 c.251G>A variant only.  The Melanoma panel offered by [facility] includes sequencing and/or deletion duplication testing of the following 12 genes: BAP1, BRCA1, BRCA2, BRIP1, CDK4, CDKN2A (p14ARF), CDKN2A (p16INK4a), MC1R, POT1, PTEN, RB1, TERT, and TP53.  The following gene was evaluated for sequence changes only: MITF (c.952G>A, p.GLU318Lys variant only). The updated report date is March 14, 2017.        03/19/2017 Surgery    Right lumpectomy: IDC with DCIS, 1.2 cm, grade 2, margins negative, 0/2 lymph nodes negative, ER 100%, PR 10%, HER-2 negative ratio 1.73, Ki-67 40% T1BN0 stage IA      04/01/2017 Oncotype testing    Oncotype Dx score: 41 (28% ROR with Tamoxifen alone)      04/16/2017 - 08/27/2017 Chemotherapy    Dose dense Adriamycin and Cytoxan 4 followed by Taxol weekly 12       09/24/2017 -  Radiation Therapy    Adjuvant radiation       CURRENT THERAPY: Radiation therapy  INTERVAL HISTORY: Bernette Seeman 62 y.o. female returns for evaluation of low back pain.  This has been going on for about 3 weeks.  It is in her lower thoracic/upper lumbar spine bilaterally, it radiates across sometimes. Her pain is constant, ranges in severity from 3-10.  She says that Tramadol is the only thing that helps.  She notes no focal weakness, fever, chills, numbness, saddle anesthesia, bowel/bladder incontinence.  She denies any constipation, diarrhea, urinary issues.  She says  the pain is constant and isn't improving.     Patient Active Problem List   Diagnosis Date Noted  . Port catheter in place 04/23/2017  . Genetic testing 03/10/2017  . Carcinoma of upper-outer quadrant of right breast in female, estrogen receptor positive ([place]) 03/05/2017  . 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 Family history of breast cancer    . Family history of melanoma   . Family history of colon cancer   . Family history of brain cancer   . Routine general medical examination at a health care facility 03/20/2016  . Migraine 08/24/2015  . Hyperlipidemia 07/02/2013  . Vitamin D deficiency 07/02/2013    is allergic to citalopram; citalopram; dexamethasone; dexamethasone; dilaudid [hydromorphone hcl]; propoxycaine; red yeast rice [cholestin]; rofecoxib; and sulfamethoxazole.  MEDICAL HISTORY: Past Medical History:  Diagnosis Date  . Depression    REMISSION/ SITUATIONAL  . Dyspnea   . Family history of brain cancer   . Family history of breast cancer   . Family history of colon cancer   . Family history of melanoma   . Hepatitis AGE 89   RESOLVED  . Hyperlipidemia   . Insomnia   . Migraines    HORMONAL  . Shingles outbreak 2003, 2015   RIGHT HIP, and between breasts 3 years ago    SURGICAL HISTORY: Past Surgical History:  Procedure Laterality Date  . BREAST LUMPECTOMY WITH RADIOACTIVE SEED AND SENTINEL LYMPH NODE BIOPSY Right 03/19/2017   Procedure: RIGHT BREAST LUMPECTOMY WITH RADIOACTIVE SEED AND SENTINEL LYMPH NODE BIOPSY;  Surgeon: Excell Seltzer, MD;  Location: [facility];  Service: General;  Laterality: Right;  . KNEE ARTHROSCOPY Right 2002  . TUBAL LIGATION    . uterine polyps  2016    SOCIAL HISTORY: Social History   Socioeconomic History  . Marital status: Married    Spouse name: Not on file  . Number of children: Not on file  . Years of education: Not on file  . Highest education level: Not on file  Social Needs  . Financial resource strain: Not on file  . Food insecurity - worry: Not on file  . Food insecurity - inability: Not on file  . Transportation needs - medical: Not on file  . Transportation needs - non-medical: Not on file  Occupational History  . Not on file  Tobacco Use  . Smoking status: Former Smoker    Last attempt to quit: 08/26/2008    Years since  quitting: 9.1  . Smokeless tobacco: Never Used  Substance and Sexual Activity  . Alcohol use: No  . Drug use: No  . Sexual activity: Yes    Birth control/protection: None  Other Topics Concern  . Not on file  Social History Narrative   ** Merged History Encounter **        FAMILY HISTORY: Family History  Problem Relation Age of Onset  . Depression Brother   . Lung cancer Brother 43       maternal half brother  . Dementia Mother        Delman Kitten  . Cancer Father 52       MELANOMA CAUSED DEATH/melanoma  . Cancer Brother        multiple myeloma/lung - maternal half brother  . Stroke Maternal Grandmother 80  . Cancer Sister 11       breast- deceased at 13 - 1/2 sister  . Breast cancer Sister   . Heart disease Maternal Grandfather   . Cancer  Paternal Grandfather        COLON  . Cervical cancer Maternal Aunt   . Lung cancer Maternal Uncle   . Other Paternal Grandmother        died in childbirth  . Dementia Maternal Aunt     Review of Systems  Constitutional: Negative for appetite change, chills, fatigue, fever and unexpected weight change.  HENT:   Negative for hearing loss and lump/mass.   Eyes: Negative for eye problems and icterus.  Respiratory: Negative for chest tightness, cough and shortness of breath.   Cardiovascular: Negative for chest pain, leg swelling and palpitations.  Gastrointestinal: Negative for abdominal distention, abdominal pain, constipation, diarrhea, nausea and vomiting.  Endocrine: Negative for hot flashes.  Genitourinary: Negative for difficulty urinating and pelvic pain.   Musculoskeletal: Positive for back pain. Negative for arthralgias.  Skin: Negative for itching and rash.  Neurological: Negative for dizziness, extremity weakness, headaches, light-headedness and numbness.  Hematological: Negative for adenopathy. Does not bruise/bleed easily.  Psychiatric/Behavioral: Negative for depression. The patient is not nervous/anxious.        PHYSICAL EXAMINATION  ECOG PERFORMANCE STATUS: 1 - Symptomatic but completely ambulatory  Vitals:   10/02/17 1313  BP: 120/65  Pulse: 80  Resp: 17  Temp: 98.2 F (36.8 C)  SpO2: 99%    Physical Exam  Constitutional: She is oriented to person, place, and time and well-developed, well-nourished, and in no distress.  HENT:  Head: Normocephalic and atraumatic.  Mouth/Throat: Oropharynx is clear and moist. No oropharyngeal exudate.  Eyes: Pupils are equal, round, and reactive to light. No scleral icterus.  Neck: Neck supple. No thyromegaly present.  Cardiovascular: Normal rate, regular rhythm and normal heart sounds.  Pulmonary/Chest: Effort normal and breath sounds normal. No respiratory distress. She has no wheezes. She has no rales.  Neg cva tenderness  Abdominal: Soft. Bowel sounds are normal. She exhibits no distension and no mass. There is no tenderness. There is no rebound and no guarding.  Musculoskeletal:  No focal spinal TTP  Lymphadenopathy:    She has no cervical adenopathy.  Neurological: She is alert and oriented to person, place, and time.  Skin: Skin is warm and dry. No rash noted.  Psychiatric: Mood and affect normal.    LABORATORY DATA:  CBC    Component Value Date/Time   WBC 5.1 10/02/2017 1229   WBC 3.6 (L) 08/27/2017 0805   WBC 4.9 04/07/2017 0931   RBC 4.14 10/02/2017 1229   HGB 11.4 (L) 08/27/2017 0805   HCT 38.7 10/02/2017 1229   HCT 33.6 (L) 08/27/2017 0805   PLT 178 10/02/2017 1229   PLT 218 08/27/2017 0805   MCV 93.5 10/02/2017 1229   MCV 95.6 08/27/2017 0805   MCH 30.9 10/02/2017 1229   MCHC 33.1 10/02/2017 1229   RDW 13.1 10/02/2017 1229   RDW 13.6 08/27/2017 0805   LYMPHSABS 1.3 10/02/2017 1229   LYMPHSABS 0.7 (L) 08/27/2017 0805   MONOABS 0.4 10/02/2017 1229   MONOABS 0.4 08/27/2017 0805   EOSABS 0.1 10/02/2017 1229   EOSABS 0.0 08/27/2017 0805   BASOSABS 0.0 10/02/2017 1229   BASOSABS 0.1 08/27/2017 0805    CMP      Component Value Date/Time   NA 137 10/02/2017 1229   NA 139 08/27/2017 0805   K 4.1 10/02/2017 1229   K 4.0 08/27/2017 0805   CL 103 10/02/2017 1229   CO2 26 10/02/2017 1229   CO2 25 08/27/2017 0805   GLUCOSE 96 10/02/2017 1229  GLUCOSE 80 08/27/2017 0805   BUN 16 10/02/2017 1229   BUN 14.3 08/27/2017 0805   CREATININE 0.67 10/02/2017 1229   CREATININE 0.7 08/27/2017 0805   CALCIUM 8.9 10/02/2017 1229   CALCIUM 8.8 08/27/2017 0805   PROT 6.7 10/02/2017 1229   PROT 6.4 08/27/2017 0805   ALBUMIN 3.8 10/02/2017 1229   ALBUMIN 3.7 08/27/2017 0805   AST 22 10/02/2017 1229   AST 43 (H) 08/27/2017 0805   ALT 31 10/02/2017 1229   ALT 71 (H) 08/27/2017 0805   ALKPHOS 70 10/02/2017 1229   ALKPHOS 56 08/27/2017 0805   BILITOT 0.9 10/02/2017 1229   BILITOT 0.62 08/27/2017 0805   GFRNONAA >60 10/02/2017 1229   GFRNONAA >89 04/07/2017 0931   GFRAA >60 10/02/2017 1229   GFRAA >89 04/07/2017 0931      ASSESSMENT and PLAN:   Carcinoma of upper-outer quadrant of right breast in female, estrogen receptor positive ([place]) 03/19/2017: Right lumpectomy: IDC with DCIS, 1.2 cm, grade 2, margins negative, 0/2 lymph nodes negative, ER 100%, PR 10%, HER-2 negative ratio 1.73, Ki-67 40% T1BN0 stage IA CHEK 2 Mutation Positive Oncotype Dx score: 41 (28% ROR with Tamoxifen alone) Treatment plan: 1. Adjuvant chemotherapy with dose dense Adriamycin Cytoxan 4 followed by weekly Taxol 12 2. followed by adjuvant radiation 3. Followed by adjuvant antiestrogen therapy ---------------------------------------------------------------------------- Current treatment: Adjuvant radiation  Nashali is here today for acute low back pain that has been persistent x 3 weeks.  The tramadol is controlling her pain and I will refill this.  However, at this point we need to image her and ensure that we aren't missing anything.  I recommended that we start with thoracic and lumbar plain films.  If negative, since her  pain isn't exactly over her spine, and could possibly be referred pain, we will get a CT A/P.  I reviewed this with her.  I recommended she take the tramadol when needed and also try Yoga.  We will f/u with her about the results.     Orders Placed This Encounter  Procedures  . DG Thoracic Spine 2 View    Standing Status:   Future    Number of Occurrences:   1    Standing Expiration Date:   10/02/2018    Order Specific Question:   Reason for Exam (SYMPTOM  OR DIAGNOSIS REQUIRED)    Answer:   back pain, breast cancer    Order Specific Question:   Preferred imaging location?    Answer:   [facility]    Order Specific Question:   Radiology Contrast Protocol - do NOT remove file path    Answer:   \\[email or web address].pdf  . DG Lumbar Spine 2-3 Views    Standing Status:   Future    Number of Occurrences:   1    Standing Expiration Date:   10/02/2018    Order Specific Question:   Reason for Exam (SYMPTOM  OR DIAGNOSIS REQUIRED)    Answer:   back pain, breast cancer    Order Specific Question:   Preferred imaging location?    Answer:   [facility]    Order Specific Question:   Radiology Contrast Protocol - do NOT remove file path    Answer:   \\[email or web address].[email or web address]    All questions were answered. The patient knows to call the clinic with any problems, questions or concerns. We can certainly see the patient much sooner if necessary.  A total of (30) minutes of face-to-face time  was spent with this patient with greater than 50% of that time in counseling and care-coordination.  This note was electronically signed. Scot Dock, NP 10/02/2017

## 2017-10-03 ENCOUNTER — Ambulatory Visit
Admission: RE | Admit: 2017-10-03 | Discharge: 2017-10-03 | Disposition: A | Discharge status: Home / Self Care | Payer: BLUE CROSS/BLUE SHIELD | Source: Ambulatory Visit | Attending: Radiation Oncology | Admitting: Radiation Oncology

## 2017-10-03 DIAGNOSIS — C50411 Malignant neoplasm of upper-outer quadrant of right female breast: Secondary | ICD-10-CM | POA: Diagnosis not present

## 2017-10-06 ENCOUNTER — Ambulatory Visit
Admission: RE | Admit: 2017-10-06 | Discharge: 2017-10-06 | Disposition: A | Discharge status: Home / Self Care | Payer: BLUE CROSS/BLUE SHIELD | Source: Ambulatory Visit | Attending: Radiation Oncology | Admitting: Radiation Oncology

## 2017-10-06 DIAGNOSIS — C50411 Malignant neoplasm of upper-outer quadrant of right female breast: Secondary | ICD-10-CM | POA: Diagnosis not present

## 2017-10-07 ENCOUNTER — Other Ambulatory Visit: Payer: Self-pay

## 2017-10-07 ENCOUNTER — Ambulatory Visit (HOSPITAL_COMMUNITY)
Admission: RE | Admit: 2017-10-07 | Discharge: 2017-10-07 | Disposition: A | Discharge status: Home / Self Care | Payer: BLUE CROSS/BLUE SHIELD | Source: Ambulatory Visit | Attending: Hematology and Oncology | Admitting: Hematology and Oncology

## 2017-10-07 ENCOUNTER — Ambulatory Visit
Admission: RE | Admit: 2017-10-07 | Discharge: 2017-10-07 | Disposition: A | Discharge status: Home / Self Care | Payer: BLUE CROSS/BLUE SHIELD | Source: Ambulatory Visit | Attending: Radiation Oncology | Admitting: Radiation Oncology

## 2017-10-07 ENCOUNTER — Encounter (HOSPITAL_COMMUNITY): Payer: Self-pay

## 2017-10-07 DIAGNOSIS — Z170 Estrogen receptor positive status [ER+]: Secondary | ICD-10-CM | POA: Diagnosis not present

## 2017-10-07 DIAGNOSIS — D1803 Hemangioma of intra-abdominal structures: Secondary | ICD-10-CM | POA: Insufficient documentation

## 2017-10-07 DIAGNOSIS — N200 Calculus of kidney: Secondary | ICD-10-CM | POA: Insufficient documentation

## 2017-10-07 DIAGNOSIS — C50411 Malignant neoplasm of upper-outer quadrant of right female breast: Secondary | ICD-10-CM | POA: Insufficient documentation

## 2017-10-07 DIAGNOSIS — N1330 Unspecified hydronephrosis: Secondary | ICD-10-CM | POA: Insufficient documentation

## 2017-10-07 HISTORY — DX: Malignant (primary) neoplasm, unspecified: C80.1

## 2017-10-07 MED ORDER — IOPAMIDOL (ISOVUE-300) INJECTION 61%
30.0000 mL | Freq: Once | INTRAVENOUS | Status: AC | PRN
  Administered 2017-10-07: 30 mL via ORAL

## 2017-10-07 MED ORDER — IOPAMIDOL (ISOVUE-300) INJECTION 61%
INTRAVENOUS | Status: AC
  Filled 2017-10-07: qty 100

## 2017-10-07 MED ORDER — IOPAMIDOL (ISOVUE-300) INJECTION 61%
INTRAVENOUS | Status: AC
  Filled 2017-10-07: qty 30

## 2017-10-07 MED ORDER — IOPAMIDOL (ISOVUE-300) INJECTION 61%
100.0000 mL | Freq: Once | INTRAVENOUS | Status: AC | PRN
  Administered 2017-10-07: 100 mL via INTRAVENOUS

## 2017-10-07 MED ORDER — SODIUM CHLORIDE 0.9 % IJ SOLN
INTRAMUSCULAR | Status: AC
  Filled 2017-10-07: qty 50

## 2017-10-07 NOTE — Progress Notes (Signed)
Pt called and informed us the urologist did not think this pain was due to her kidney stones and they did not suggest to do a CT until two weeks out if she continues to have pain. Per Dr. Lindi Adie she is to have a CT today. Scheduled pt today for CT abd/pelvis with contrast. She is aware of times of appointments.  Cyndia Bent RN

## 2017-10-08 ENCOUNTER — Ambulatory Visit
Admission: RE | Admit: 2017-10-08 | Discharge: 2017-10-08 | Disposition: A | Discharge status: Home / Self Care | Payer: BLUE CROSS/BLUE SHIELD | Source: Ambulatory Visit | Attending: Radiation Oncology | Admitting: Radiation Oncology

## 2017-10-08 DIAGNOSIS — C50411 Malignant neoplasm of upper-outer quadrant of right female breast: Secondary | ICD-10-CM | POA: Diagnosis not present

## 2017-10-09 ENCOUNTER — Ambulatory Visit
Admission: RE | Admit: 2017-10-09 | Discharge: 2017-10-09 | Disposition: A | Discharge status: Home / Self Care | Payer: BLUE CROSS/BLUE SHIELD | Source: Ambulatory Visit | Attending: Radiation Oncology | Admitting: Radiation Oncology

## 2017-10-09 DIAGNOSIS — C50411 Malignant neoplasm of upper-outer quadrant of right female breast: Secondary | ICD-10-CM | POA: Diagnosis not present

## 2017-10-10 ENCOUNTER — Ambulatory Visit
Admission: RE | Admit: 2017-10-10 | Discharge: 2017-10-10 | Disposition: A | Discharge status: Home / Self Care | Payer: BLUE CROSS/BLUE SHIELD | Source: Ambulatory Visit | Attending: Radiation Oncology | Admitting: Radiation Oncology

## 2017-10-10 DIAGNOSIS — C50411 Malignant neoplasm of upper-outer quadrant of right female breast: Secondary | ICD-10-CM | POA: Diagnosis not present

## 2017-10-13 ENCOUNTER — Ambulatory Visit
Admission: RE | Admit: 2017-10-13 | Discharge: 2017-10-13 | Disposition: A | Discharge status: Home / Self Care | Payer: BLUE CROSS/BLUE SHIELD | Source: Ambulatory Visit | Attending: Radiation Oncology | Admitting: Radiation Oncology

## 2017-10-13 DIAGNOSIS — C50411 Malignant neoplasm of upper-outer quadrant of right female breast: Secondary | ICD-10-CM | POA: Diagnosis not present

## 2017-10-14 ENCOUNTER — Ambulatory Visit
Admission: RE | Admit: 2017-10-14 | Discharge: 2017-10-14 | Disposition: A | Discharge status: Home / Self Care | Payer: BLUE CROSS/BLUE SHIELD | Source: Ambulatory Visit | Attending: Radiation Oncology | Admitting: Radiation Oncology

## 2017-10-14 DIAGNOSIS — C50411 Malignant neoplasm of upper-outer quadrant of right female breast: Secondary | ICD-10-CM | POA: Diagnosis not present

## 2017-10-15 ENCOUNTER — Ambulatory Visit
Admission: RE | Admit: 2017-10-15 | Discharge: 2017-10-15 | Disposition: A | Discharge status: Home / Self Care | Payer: BLUE CROSS/BLUE SHIELD | Source: Ambulatory Visit | Attending: Radiation Oncology | Admitting: Radiation Oncology

## 2017-10-15 ENCOUNTER — Telehealth: Payer: Self-pay

## 2017-10-15 DIAGNOSIS — C50411 Malignant neoplasm of upper-outer quadrant of right female breast: Secondary | ICD-10-CM | POA: Diagnosis not present

## 2017-10-15 NOTE — Telephone Encounter (Signed)
Returned pt call regarding appts for next week. I have canceled her flush appt due to port being removed and moved her lab appt closer to her MD appt. She stated she will see appt changes in her MyChart.  Cyndia Bent RN

## 2017-10-18 ENCOUNTER — Other Ambulatory Visit: Payer: Self-pay | Admitting: Hematology and Oncology

## 2017-10-18 DIAGNOSIS — C50411 Malignant neoplasm of upper-outer quadrant of right female breast: Secondary | ICD-10-CM

## 2017-10-18 DIAGNOSIS — Z170 Estrogen receptor positive status [ER+]: Secondary | ICD-10-CM

## 2017-10-21 ENCOUNTER — Encounter: Payer: Self-pay | Admitting: Radiation Oncology

## 2017-10-21 NOTE — Progress Notes (Signed)
  Radiation Oncology         [phone] ________________________________  Name: Rebecca [name] MRN: [id number]  Date: 10/21/2017  DOB: 02-15-1956  End of Treatment Note  Diagnosis:   Carcinoma of upper-outer quadrant of right breast in female, estrogen receptor positive ([place]) Staging form: Breast, AJCC 8th Edition - Clinical: Stage IA (cT1b, cN0, cM0, G2, ER: Positive, PR: Positive, HER2: Negative) - Unsigned - Pathologic: Stage IA (pT1c, pN0, cM0, G2, ER: Positive, PR: Positive, HER2: Negative) - Signed by Eppie Gibson, MD on 09/02/2017     Indication for treatment:  Curative       Radiation treatment dates:   09/24/17 - 10/15/17  Site/dose:   Right breast treated to 42.56 Gy with 16 fx of 2.66 Gy  Beams/energy:   3D / 6X, 10X  Narrative: The patient tolerated radiation treatment relatively well.   Patient denied pain but did endorse some fatigue throughout treatment. She also endorsed soreness at right breast but treated with radiaplex gel.  Plan: The patient has completed radiation treatment. The patient will return to radiation oncology clinic for routine followup in one month. I advised them to call or return sooner if they have any questions or concerns related to their recovery or treatment.  -----------------------------------  Eppie Gibson, MD   This document serves as a record of services personally performed by Eppie Gibson, MD. It was created on his behalf by Linward Natal, a trained medical scribe. The creation of this record is based on the scribe's personal observations and the provider's statements to them. This document has been checked and approved by the attending provider.

## 2017-10-21 NOTE — Assessment & Plan Note (Signed)
03/19/2017: Right lumpectomy: IDC with DCIS, 1.2 cm, grade 2, margins negative, 0/2 lymph nodes negative, ER 100%, PR 10%, HER-2 negative ratio 1.73, Ki-67 40% T1BN0 stage IA CHEK 2 Mutation Positive Oncotype Dx score: 41 (28% ROR with Tamoxifen alone) Treatment plan: 1. Adjuvant chemotherapy with dose dense Adriamycin Cytoxan 4 followed by weekly Taxol 12 2. followed by adjuvant radiation 09/25/17-10/15/17 3. Followed by adjuvant antiestrogen therapy ---------------------------------------------------------------------------- acute low back pain that has been persistent x 3 weeks.  The tramadol is controlling her pain and I will refill this.  CT Abd and Pelvis 10/07/17: no mets  Plan: anti estrogen therapy with Letrozole 2.5 mg daily

## 2017-10-22 ENCOUNTER — Inpatient Hospital Stay (HOSPITAL_BASED_OUTPATIENT_CLINIC_OR_DEPARTMENT_OTHER): Payer: BLUE CROSS/BLUE SHIELD | Admitting: Hematology and Oncology

## 2017-10-22 ENCOUNTER — Telehealth: Payer: Self-pay | Admitting: Hematology and Oncology

## 2017-10-22 ENCOUNTER — Other Ambulatory Visit: Payer: BLUE CROSS/BLUE SHIELD

## 2017-10-22 ENCOUNTER — Inpatient Hospital Stay: Payer: BLUE CROSS/BLUE SHIELD

## 2017-10-22 DIAGNOSIS — Z79811 Long term (current) use of aromatase inhibitors: Secondary | ICD-10-CM | POA: Diagnosis not present

## 2017-10-22 DIAGNOSIS — Z170 Estrogen receptor positive status [ER+]: Secondary | ICD-10-CM

## 2017-10-22 DIAGNOSIS — C50411 Malignant neoplasm of upper-outer quadrant of right female breast: Secondary | ICD-10-CM | POA: Diagnosis not present

## 2017-10-22 DIAGNOSIS — M545 Low back pain, unspecified: Secondary | ICD-10-CM

## 2017-10-22 LAB — COMPREHENSIVE METABOLIC PANEL
ALT: 25 U/L (ref 0–55)
ANION GAP: 9 (ref 3–11)
AST: 21 U/L (ref 5–34)
Albumin: 3.8 g/dL (ref 3.5–5.0)
Alkaline Phosphatase: 66 U/L (ref 40–150)
BUN: 12 mg/dL (ref 7–26)
CO2: 26 mmol/L (ref 22–29)
Calcium: 9 mg/dL (ref 8.4–10.4)
Chloride: 105 mmol/L (ref 98–109)
Creatinine, Ser: 0.73 mg/dL (ref 0.60–1.10)
GFR calc non Af Amer: >60 mL/min (ref >60)
Glucose, Bld: 101 mg/dL (ref 70–140)
POTASSIUM: 4.1 mmol/L (ref 3.5–5.1)
SODIUM: 140 mmol/L (ref 136–145)
TOTAL PROTEIN: 6.8 g/dL (ref 6.4–8.3)
Total Bilirubin: 0.8 mg/dL (ref 0.2–1.2)

## 2017-10-22 LAB — CBC WITH DIFFERENTIAL/PLATELET
BASOS PCT: 1 %
Basophils Absolute: 0 10*3/uL (ref 0.0–0.1)
EOS ABS: 0.1 10*3/uL (ref 0.0–0.5)
EOS PCT: 2 %
HCT: 39.5 % (ref 34.8–46.6)
Hemoglobin: 13.2 g/dL (ref 11.6–15.9)
Lymphocytes Relative: 23 %
Lymphs Abs: 1 10*3/uL (ref 0.9–3.3)
MCH: 30.6 pg (ref 25.1–34.0)
MCHC: 33.4 g/dL (ref 31.5–36.0)
MCV: 91.4 fL (ref 79.5–101.0)
MONO ABS: 0.3 10*3/uL (ref 0.1–0.9)
MONOS PCT: 8 %
Neutro Abs: 2.7 10*3/uL (ref 1.5–6.5)
Neutrophils Relative %: 66 %
Platelets: 184 10*3/uL (ref 145–400)
RBC: 4.32 MIL/uL (ref 3.70–5.45)
RDW: 12.9 % (ref 11.2–14.5)
WBC: 4.2 10*3/uL (ref 3.9–10.3)

## 2017-10-22 MED ORDER — TRAMADOL HCL 50 MG PO TABS: 100.0000 mg | ORAL_TABLET | Freq: Four times a day (QID) | ORAL | 0 refills | Status: DC | PRN

## 2017-10-22 NOTE — Progress Notes (Signed)
Patient Care Team: [name], MD as PCP - General (Internal Medicine) [name], MD (Internal Medicine) Arta Silence, MD as Consulting Physician (Gastroenterology) Arvella Nigh, MD as Consulting Physician (Obstetrics and Gynecology) Nicholas Lose, MD as Consulting Physician (Hematology and Oncology) Eppie Gibson, MD as Attending Physician (Radiation Oncology) Excell Seltzer, MD as Consulting Physician (General Surgery)  DIAGNOSIS:  Encounter Diagnoses  Name Primary?  . Carcinoma of upper-outer quadrant of right breast in female, estrogen receptor positive ([place])   . 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  The STAT Breast cancer panel offered by [facility] includes sequencing and rearrangement analysis for the following 9 genes:  ATM, BRCA1, BRCA2, CDH1, CHEK2, PALB2, PTEN, STK11 and TP53.   The report date is March 09, 2017.  Re-requitisioned to the common hereditary cancer panel and the melanoma panel.  The additional genes tested were negative.  The Hereditary Gene Panel offered by [facility] includes sequencing and/or deletion duplication testing of the following 46 genes: APC, ATM, AXIN2, BARD1, BMPR1A, BRCA1, BRCA2, BRIP1, CDH1, CDKN2A (p14ARF), CDKN2A (p16INK4a), CHEK2, CTNNA1, DICER1, EPCAM (Deletion/duplication testing only), GREM1 (promoter  region deletion/duplication testing only), KIT, MEN1, MLH1, MSH2, MSH3, MSH6, MUTYH, NBN, NF1, NHTL1, PALB2, PDGFRA, PMS2, POLD1, POLE, PTEN, RAD50, RAD51C, RAD51D, SDHB, SDHC, SDHD, SMAD4, SMARCA4. STK11, TP53, TSC1, TSC2, and VHL.  The following genes were evaluated for sequence changes only: SDHA and HOXB13 c.251G>A variant only.  The Melanoma panel offered by [facility] includes sequencing and/or deletion duplication testing of the following 12 genes: BAP1, BRCA1, BRCA2, BRIP1, CDK4, CDKN2A (p14ARF), CDKN2A (p16INK4a), MC1R, POT1, PTEN, RB1, TERT, and TP53.  The following gene was evaluated for sequence changes only: MITF (c.952G>A, p.GLU318Lys variant only). The updated report date is March 14, 2017.        03/19/2017 Surgery    Right lumpectomy: IDC with DCIS, 1.2 cm, grade 2, margins negative, 0/2 lymph nodes negative, ER 100%, PR 10%, HER-2 negative ratio 1.73, Ki-67 40% T1BN0 stage IA      04/01/2017 Oncotype testing    Oncotype Dx score: 41 (28% ROR with Tamoxifen alone)      04/16/2017 - 08/27/2017 Chemotherapy    Dose dense Adriamycin and Cytoxan 4 followed by Taxol weekly 12       09/24/2017 - 10/17/2017 Radiation Therapy    Adjuvant radiation       CHIEF COMPLIANT: Complaining of severe right flank pain  INTERVAL HISTORY: Rebecca [name] is a 62 year old with above-mentioned history of right breast cancer treated with neoadjuvant chemotherapy followed by lumpectomy and radiation.  She has been having flank pain which has been intractable.  She had a CT scan that showed bilateral kidney stones.  She has an appointment to see urology.  She has been seeing a chiropractor who has been trying to help her.  She takes tramadol which appears to be helping her but it starts to help 4 hours after she takes it.  She has multiple allergies to narcotic pain medications.  REVIEW OF SYSTEMS:   Constitutional: Denies fevers, chills or abnormal weight loss Eyes: Denies blurriness of vision Ears, nose,  mouth, throat, and face: Denies mucositis or sore throat Respiratory: Denies cough, dyspnea or wheezes Cardiovascular: Denies palpitation, chest discomfort Gastrointestinal:  Denies nausea, heartburn or change in bowel habits Skin: Denies abnormal skin rashes Lymphatics: Denies new lymphadenopathy or easy bruising Neurological:Denies numbness, tingling or new weaknesses Behavioral/Psych: Mood is stable, no new changes  Extremities: No lower extremity edema Breast: Completed adjuvant radiation therapy All other systems were reviewed with the patient and are negative.  I have reviewed the past medical history, past surgical history, social history and family history with the patient and they are unchanged from previous note.  ALLERGIES:  is allergic to citalopram; citalopram; dexamethasone; dexamethasone; dilaudid [hydromorphone hcl]; propoxycaine; red yeast rice [cholestin]; rofecoxib; and sulfamethoxazole.  MEDICATIONS:  Current Outpatient Medications  Medication Sig Dispense Refill  . ibuprofen (ADVIL,MOTRIN) 200 MG tablet Take 200 mg by mouth every 6 (six) hours as needed for mild pain.    Marland Kitchen LORazepam (ATIVAN) 0.5 MG tablet TAKE 1 TABLET BY MOUTH AT BEDTIME AS NEEDED 30 tablet 1  . prochlorperazine (COMPAZINE) 10 MG tablet Take 1 tablet (10 mg total) by mouth every 6 (six) hours as needed (Nausea or vomiting). 30 tablet 1  . traMADol (ULTRAM) 50 MG tablet Take 2 tablets (100 mg total) by mouth every 6 (six) hours as needed. 90 tablet 0   No current facility-administered medications for this visit.     PHYSICAL EXAMINATION: ECOG PERFORMANCE STATUS: 1 - Symptomatic but completely ambulatory  Vitals:   10/22/17 1325  BP: 124/67  Pulse: 72  Resp: 20  Temp: 98 F (36.7 C)  SpO2: 98%   Filed Weights   10/22/17 1325  Weight: 172 lb 1.6 oz (78.1 kg)    GENERAL:alert, no distress and comfortable SKIN: skin color, texture, turgor are normal, no rashes or significant  lesions EYES: normal, Conjunctiva are pink and non-injected, sclera clear OROPHARYNX:no exudate, no erythema and lips, buccal mucosa, and tongue normal  NECK: supple, thyroid normal size, non-tender, without nodularity LYMPH:  no palpable lymphadenopathy in the cervical, axillary or inguinal LUNGS: clear to auscultation and percussion with normal breathing effort HEART: regular rate & rhythm and no murmurs and no lower extremity edema ABDOMEN:abdomen soft, non-tender and normal bowel sounds MUSCULOSKELETAL:no cyanosis of digits and no clubbing  NEURO: alert & oriented x 3 with fluent speech, no focal motor/sensory deficits EXTREMITIES: No lower extremity edema   LABORATORY DATA:  I have reviewed the data as listed CMP Latest Ref Rng & Units 10/22/2017 10/02/2017 08/27/2017  Glucose 70 - 140 mg/dL 101 96 80  BUN 7 - 26 mg/dL 12 16 14.3  Creatinine 0.60 - 1.10 mg/dL 0.73 0.67 0.7  Sodium 136 - 145 mmol/L 140 137 139  Potassium 3.5 - 5.1 mmol/L 4.1 4.1 4.0  Chloride 98 - 109 mmol/L 105 103 -  CO2 22 - 29 mmol/L _0 Calcium 8.4 - 10.4 mg/dL 9.0 8.9 8.8  Total Protein 6.4 - 8.3 g/dL 6.8 6.7 6.4  Total Bilirubin 0.2 - 1.2 mg/dL 0.8 0.9 0.62  Alkaline Phos 40 - 150 U/L 66 70 56  AST 5 - 34 U/L 21 22 43(H)  ALT 0 - 55 U/L 25 31  71(H)    Lab Results  Component Value Date   WBC 4.2 10/22/2017   HGB 13.2 10/22/2017   HCT 39.5 10/22/2017   MCV 91.4 10/22/2017   PLT 184 10/22/2017   NEUTROABS 2.7 10/22/2017    ASSESSMENT & PLAN:  Carcinoma of upper-outer quadrant of right breast in female, estrogen receptor positive ([place]) 03/19/2017: Right lumpectomy: IDC with DCIS, 1.2 cm, grade 2, margins negative, 0/2 lymph nodes negative, ER 100%, PR 10%, HER-2 negative ratio 1.73, Ki-67 40% T1BN0 stage IA CHEK 2 Mutation Positive Oncotype Dx score: 41 (28% ROR with Tamoxifen alone) Treatment plan: 1. Adjuvant chemotherapy with dose dense Adriamycin Cytoxan 4 followed by weekly Taxol 12 2.  followed by adjuvant radiation 09/25/17-10/15/17 3. Followed by adjuvant antiestrogen therapy ---------------------------------------------------------------------------- acute low back pain that has been persistent x 3 weeks.  The tramadol is controlling her pain and I will refill this.  CT Abd and Pelvis 10/07/17: no mets, bilateral kidney stones I discussed with her that the cause of the pain is most likely kidney stones. I sent a message for the urologist to see if he can see her sooner.  Plan: anti estrogen therapy with Letrozole 2.5 mg daily.  This will be delayed until her back pain has improved.  I spent 25 minutes talking to the patient of which more than half was spent in counseling and coordination of care.  No orders of the defined types were placed in this encounter.  The patient has a good understanding of the overall plan. she agrees with it. she will call with any problems that may develop before the next visit here.   Harriette Ohara, MD 10/22/17

## 2017-10-22 NOTE — Telephone Encounter (Signed)
Gave patient AVS and calendar of upcoming march appointments.

## 2017-11-06 ENCOUNTER — Encounter: Payer: Self-pay | Admitting: Radiation Oncology

## 2017-11-10 ENCOUNTER — Inpatient Hospital Stay: Payer: BLUE CROSS/BLUE SHIELD | Attending: Hematology and Oncology | Admitting: Medical

## 2017-11-10 ENCOUNTER — Telehealth: Payer: Self-pay

## 2017-11-10 ENCOUNTER — Ambulatory Visit: Payer: Self-pay | Admitting: Adult Health

## 2017-11-10 VITALS — BP 130/51 | HR 69 | Temp 97.7°F | Resp 14 | Wt 170.1 lb

## 2017-11-10 DIAGNOSIS — T300 Burn of unspecified body region, unspecified degree: Secondary | ICD-10-CM

## 2017-11-10 DIAGNOSIS — T2101XA Burn of unspecified degree of chest wall, initial encounter: Secondary | ICD-10-CM

## 2017-11-10 DIAGNOSIS — C50911 Malignant neoplasm of unspecified site of right female breast: Secondary | ICD-10-CM

## 2017-11-10 DIAGNOSIS — R208 Other disturbances of skin sensation: Secondary | ICD-10-CM

## 2017-11-10 MED ORDER — GABAPENTIN 100 MG PO CAPS: 200.0000 mg | ORAL_CAPSULE | Freq: Every day | ORAL | 0 refills | Status: DC

## 2017-11-10 NOTE — Telephone Encounter (Signed)
Patient is having a tingling and very painful sensation on her side without a sign of a rash. I have scheduled her in symptom management today. She is aware of appointment date/time.  Cyndia Bent RN

## 2017-11-10 NOTE — Progress Notes (Signed)
[facility] Progress Note   Rebecca [name] [id number] 08-10-1956 62 y.o.  Rebecca [name] is managed by Dr. Nicholas Lose  Actively treated with chemotherapy: no   Assessment: Plan:    Allodynia - Plan: gabapentin (NEURONTIN) 100 MG capsule  Radiation burn   Allodynia of the left chest wall without rash or vesicles: The patient is instructed to continue using tramadol as needed.  She has been given a prescription for Neurontin 200 mg p.o. nightly as needed.  The patient was reassured and was told that it does not appear that she has shingles.  She was instructed to contact our office immediately should she develop a rash or vesicles at which time consideration would be given to starting a course of Valtrex.  Radiation burn: Continue to use Silvadene cream as needed.  Please see After Visit Summary for patient specific instructions.  Future Appointments  Date Time Provider [place]  11/12/2017 11:40 AM Eppie Gibson, MD [facility] None  11/19/2017  2:15 PM Nicholas Lose, MD [facility] None  04/08/2018  9:00 AM Vicie Mutters, PA-C [facility] None    No orders of the defined types were placed in this encounter.      Subjective:   Patient ID:  Rebecca [name] is a 63 y.o. (DOB 1955/10/01) female.  Chief Complaint:  Chief Complaint  Patient presents with  . burning sensation left flank    HPI Rebecca [name] is a 62 year old female with an ER positive carcinoma of the right breast. She is seen by Dr. Lindi Adie. She was last seen on 10/22/2017. His note from that visits indicated that the patient a recent CT scan showed bilateral kidney stones. She is pending a procedure with urology on Friday for management of her kidney stones.. The patient presents with pain on her left lateral chest wall side without a rash.  She has had this pain for the past 2 days.  The pain is described as tingling/burning.  She finished chemotherapy on 08/27/2017 and completed radiation  therapy to her right breast 2-3 weeks ago.  She reports that she has never had a shingles vaccine.  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  She reports being given a spinal manipulation which treated her shingles.  She also reports having an area of skin breakdown under her right breast where radiation was recently completed.  Medications: I have reviewed the patient's current medications.  Allergies:  Allergies  Allergen Reactions  . Citalopram Other (See Comments)    HYPERACTIVITY  . Citalopram     Hyper  . Dexamethasone Other (See Comments)    CHEST TIGHTNESS  . Dexamethasone     Chest tightness  . Dilaudid [Hydromorphone Hcl] Nausea And Vomiting  . Propoxycaine Nausea And Vomiting  . Red Yeast Rice [Cholestin] Other (See Comments)    headache  . Rofecoxib Nausea And Vomiting  . Sulfamethoxazole Swelling    Past Medical History:  Diagnosis Date  . Depression    REMISSION/ SITUATIONAL  . Dyspnea   . Family history of brain cancer   . Family history of breast cancer   . Family history of colon cancer   . Family history of melanoma   . Hepatitis AGE 42   RESOLVED  . History of radiation therapy 09/24/17- 10/15/17   Right Breast treated to 42.56 Gy with 16 fx of 2.66 Gy  . Hyperlipidemia   . Insomnia   . Migraines    HORMONAL  .  right breast ca dx' d 02/2017  . Shingles outbreak 2003, 2015   RIGHT HIP, and between breasts 3 years ago    Past Surgical History:  Procedure Laterality Date  . BREAST LUMPECTOMY WITH RADIOACTIVE SEED AND SENTINEL LYMPH NODE BIOPSY Right 03/19/2017   Procedure: RIGHT BREAST LUMPECTOMY WITH RADIOACTIVE SEED AND SENTINEL LYMPH NODE BIOPSY;  Surgeon: Excell Seltzer, MD;  Location: [facility];  Service: General;  Laterality: Right;  . KNEE ARTHROSCOPY Right 2002  . TUBAL LIGATION    . uterine polyps  2016    Family History  Problem Relation Age of Onset  .  Depression Brother   . Lung cancer Brother 83       maternal half brother  . Dementia Mother        Delman Kitten  . Cancer Father 72       MELANOMA CAUSED DEATH/melanoma  . Cancer Brother        multiple myeloma/lung - maternal half brother  . Stroke Maternal Grandmother 80  . Cancer Sister 80       breast- deceased at 87 - 1/2 sister  . Breast cancer Sister   . Heart disease Maternal Grandfather   . Cancer Paternal Grandfather        COLON  . Cervical cancer Maternal Aunt   . Lung cancer Maternal Uncle   . Other Paternal Grandmother        died in childbirth  . Dementia Maternal Aunt   Is box of the lid chest ENT started him about this and decide which she is is 90 glue probably just go to a mean of 40 mL of age which is a 54 in any way to secure the bottom 100 and are [place] take a picture of it and bring it in here tomorrow and let me look at it may have better idea his dyspnea and size thickness.  To bring in a picture to me look good idea concha right breast is nice  Social History   Socioeconomic History  . Marital status: Married    Spouse name: Not on file  . Number of children: Not on file  . Years of education: Not on file  . Highest education level: Not on file  Social Needs  . Financial resource strain: Not on file  . Food insecurity - worry: Not on file  . Food insecurity - inability: Not on file  . Transportation needs - medical: Not on file  . Transportation needs - non-medical: Not on file  Occupational History  . Not on file  Tobacco Use  . Smoking status: Former Smoker    Last attempt to quit: 08/26/2008    Years since quitting: 9.2  . Smokeless tobacco: Never Used  Substance and Sexual Activity  . Alcohol use: No  . Drug use: No  . Sexual activity: Yes    Birth control/protection: None  Other Topics Concern  . Not on file  Social History Narrative   ** Merged History Encounter **        Past Medical History, Surgical history, Social history, and  Family history were reviewed and updated as appropriate.   Please see review of systems for further details on the patient's review from today.   Review of Systems:  Review of Systems  Constitutional: Negative for chills, diaphoresis and fever.  Respiratory: Negative for cough, choking, chest tightness, shortness of breath and wheezing.   Cardiovascular: Positive for chest pain. Negative for palpitations.  Skin:  Positive for rash and wound (Skin breakdown under the right breast at the site of recent radiation treatments.).  Neurological: Negative for weakness.    Objective:   Physical Exam:  BP (!) 130/51 (BP Location: Left Arm, Patient Position: Sitting)   Pulse 69   Temp 97.7 F (36.5 C) (Oral)   Resp 14   Wt 170 lb 1.6 oz (77.2 kg)   LMP 08/03/2013   SpO2 98%   BMI 27.04 kg/m  ECOG: 0  Physical Exam  Constitutional: No distress.  HENT:  Head: Normocephalic and atraumatic.  Cardiovascular: Normal rate, regular rhythm and normal heart sounds. Exam reveals no gallop and no friction rub.  No murmur heard. Pulmonary/Chest: Effort normal and breath sounds normal. No respiratory distress. She has no wheezes. She has no rales.    Neurological: She is alert. Coordination normal.  Skin: Skin is warm and dry. No rash noted. She is not diaphoretic. No erythema.    Lab Review:     Component Value Date/Time   NA 140 10/22/2017 1309   NA 139 08/27/2017 0805   K 4.1 10/22/2017 1309   K 4.0 08/27/2017 0805   CL 105 10/22/2017 1309   CO2 26 10/22/2017 1309   CO2 25 08/27/2017 0805   GLUCOSE 101 10/22/2017 1309   GLUCOSE 80 08/27/2017 0805   BUN 12 10/22/2017 1309   BUN 14.3 08/27/2017 0805   CREATININE 0.73 10/22/2017 1309   CREATININE 0.67 10/02/2017 1229   CREATININE 0.7 08/27/2017 0805   CALCIUM 9.0 10/22/2017 1309   CALCIUM 8.8 08/27/2017 0805   PROT 6.8 10/22/2017 1309   PROT 6.4 08/27/2017 0805   ALBUMIN 3.8 10/22/2017 1309   ALBUMIN 3.7 08/27/2017 0805   AST 21  10/22/2017 1309   AST 22 10/02/2017 1229   AST 43 (H) 08/27/2017 0805   ALT 25 10/22/2017 1309   ALT 31 10/02/2017 1229   ALT 71 (H) 08/27/2017 0805   ALKPHOS 66 10/22/2017 1309   ALKPHOS 56 08/27/2017 0805   BILITOT 0.8 10/22/2017 1309   BILITOT 0.9 10/02/2017 1229   BILITOT 0.62 08/27/2017 0805   GFRNONAA >60 10/22/2017 1309   GFRNONAA >60 10/02/2017 1229   GFRNONAA >89 04/07/2017 0931   GFRAA >60 10/22/2017 1309   GFRAA >60 10/02/2017 1229   GFRAA >89 04/07/2017 0931       Component Value Date/Time   WBC 4.2 10/22/2017 1309   RBC 4.32 10/22/2017 1309   HGB 13.2 10/22/2017 1309   HGB 11.4 (L) 08/27/2017 0805   HCT 39.5 10/22/2017 1309   HCT 33.6 (L) 08/27/2017 0805   PLT 184 10/22/2017 1309   PLT 178 10/02/2017 1229   PLT 218 08/27/2017 0805   MCV 91.4 10/22/2017 1309   MCV 95.6 08/27/2017 0805   MCH 30.6 10/22/2017 1309   MCHC 33.4 10/22/2017 1309   RDW 12.9 10/22/2017 1309   RDW 13.6 08/27/2017 0805   LYMPHSABS 1.0 10/22/2017 1309   LYMPHSABS 0.7 (L) 08/27/2017 0805   MONOABS 0.3 10/22/2017 1309   MONOABS 0.4 08/27/2017 0805   EOSABS 0.1 10/22/2017 1309   EOSABS 0.0 08/27/2017 0805   BASOSABS 0.0 10/22/2017 1309   BASOSABS 0.1 08/27/2017 0805   -------------------------------  Imaging from last 24 hours (if applicable):  Radiology interpretation: No results found.

## 2017-11-12 ENCOUNTER — Other Ambulatory Visit: Payer: Self-pay

## 2017-11-12 ENCOUNTER — Encounter: Payer: Self-pay | Admitting: Radiation Oncology

## 2017-11-12 ENCOUNTER — Ambulatory Visit
Admission: RE | Admit: 2017-11-12 | Discharge: 2017-11-12 | Disposition: A | Discharge status: Home / Self Care | Payer: BLUE CROSS/BLUE SHIELD | Source: Ambulatory Visit | Attending: Radiation Oncology | Admitting: Radiation Oncology

## 2017-11-12 VITALS — BP 124/71 | HR 77 | Temp 98.0°F | Resp 18 | Wt 168.5 lb

## 2017-11-12 DIAGNOSIS — Z170 Estrogen receptor positive status [ER+]: Secondary | ICD-10-CM

## 2017-11-12 DIAGNOSIS — Z882 Allergy status to sulfonamides status: Secondary | ICD-10-CM | POA: Insufficient documentation

## 2017-11-12 DIAGNOSIS — Z79899 Other long term (current) drug therapy: Secondary | ICD-10-CM | POA: Insufficient documentation

## 2017-11-12 DIAGNOSIS — N200 Calculus of kidney: Secondary | ICD-10-CM | POA: Insufficient documentation

## 2017-11-12 DIAGNOSIS — C50411 Malignant neoplasm of upper-outer quadrant of right female breast: Secondary | ICD-10-CM | POA: Insufficient documentation

## 2017-11-12 DIAGNOSIS — L814 Other melanin hyperpigmentation: Secondary | ICD-10-CM | POA: Insufficient documentation

## 2017-11-12 DIAGNOSIS — Z923 Personal history of irradiation: Secondary | ICD-10-CM | POA: Insufficient documentation

## 2017-11-12 DIAGNOSIS — Z885 Allergy status to narcotic agent status: Secondary | ICD-10-CM | POA: Insufficient documentation

## 2017-11-12 HISTORY — DX: Personal history of irradiation: Z92.3

## 2017-11-12 NOTE — Progress Notes (Signed)
Ms. Glacken presents for follow up of radiation completed 10/15/17 to her Right Breast. She has hyperpigmentation present to her Right Breast. She had peeling underneath her Right Breast, but it has healed well. She is using Radiaplex twice daily as directed and will switch to a vitamin E cream when the tube is completed. She has had issues with kidney stones and has laser surgery planned for Friday. She has not started Letrozole at this time, but will when her kidney stones have improved. She will see Dr. Lindi Adie 11/19/17.   BP 124/71   Pulse 77   Temp 98 F (36.7 C) (Oral)   Resp 18   Wt 168 lb 8 oz (76.4 kg)   LMP 08/03/2013   SpO2 97%   BMI 26.79 kg/m    Wt Readings from Last 3 Encounters:  11/12/17 168 lb 8 oz (76.4 kg)  11/10/17 170 lb 1.6 oz (77.2 kg)  10/22/17 172 lb 1.6 oz (78.1 kg)

## 2017-11-12 NOTE — Progress Notes (Signed)
Radiation Oncology         [phone] ________________________________  Name: Rebecca [name] MRN: [id number]  Date: 11/12/2017  DOB: [birth year]  Follow-Up Visit Note  Outpatient  CC: [name], MD  Excell Seltzer, MD  Diagnosis and Prior Radiotherapy:    ICD-10-CM   1. Carcinoma of upper-outer quadrant of right breast in female, estrogen receptor positive ([place]) C50.411    Z17.0     Carcinoma of upper-outer quadrant of right breast in female, estrogen receptor positive ([place]) Staging form: Breast, AJCC 8th Edition - Clinical: Stage IA (cT1b, cN0, cM0, G2, ER: Positive, PR: Positive, HER2: Negative) - Unsigned - Pathologic: Stage IA (pT1c, pN0, cM0, G2, ER: Positive, PR: Positive, HER2: Negative) - Signed by Eppie Gibson, MD on 09/02/2017     Radiation treatment dates:   09/24/2017 - 10/15/2017 Site/dose:   Right breast treated to 42.56 Gy in 16 fractions of 2.66 Gy  CHIEF COMPLAINT: Here for follow-up and surveillance of right breast cancer  Narrative:  The patient returns today for routine follow-up of radiation completed 1 month ago to her right breast. She reports continued darkness to her right breast. She reports she had peeling underneath her right breast, but it has healed well. She is using Radiaplex twice daily as directed and will switch to a vitamin E cream when the tube is completed. She reports she has had issues with kidney stones and has laser surgery planned for Friday 11/14/2017. She has not started Letrozole at this time but will when her kidney stones have improved. She will see Dr. Lindi Adie on 11/19/2017. Her next mammogram is scheduled for June.                            ALLERGIES:  is allergic to citalopram; citalopram; dexamethasone; dexamethasone; dilaudid [hydromorphone hcl]; propoxycaine; red yeast rice [cholestin]; rofecoxib; and sulfamethoxazole.  Meds: Current Outpatient Medications  Medication Sig Dispense Refill  . ibuprofen (ADVIL,MOTRIN) 200  MG tablet Take 200 mg by mouth every 6 (six) hours as needed for mild pain.    Marland Kitchen LORazepam (ATIVAN) 0.5 MG tablet TAKE 1 TABLET BY MOUTH AT BEDTIME AS NEEDED 30 tablet 1  . traMADol (ULTRAM) 50 MG tablet Take 2 tablets (100 mg total) by mouth every 6 (six) hours as needed. 90 tablet 0  . gabapentin (NEURONTIN) 100 MG capsule Take 2 capsules (200 mg total) by mouth at bedtime. (Patient not taking: Reported on 11/12/2017) 28 capsule 0  . prochlorperazine (COMPAZINE) 10 MG tablet Take 1 tablet (10 mg total) by mouth every 6 (six) hours as needed (Nausea or vomiting). (Patient not taking: Reported on 11/10/2017) 30 tablet 1   No current facility-administered medications for this encounter.     Physical Findings: The patient is in no acute distress. Patient is alert and oriented.  weight is 168 lb 8 oz (76.4 kg). Her oral temperature is 98 F (36.7 C). Her blood pressure is 124/71 and her pulse is 77. Her respiration is 18 and oxygen saturation is 97%.    Satisfactory skin healing in radiotherapy fields. She still has residual hyperpigmentation over the right breast and some hypopigmentation at the IM fold.   Lab Findings: Lab Results  Component Value Date   WBC 4.2 10/22/2017   HGB 13.2 10/22/2017   HCT 39.5 10/22/2017   MCV 91.4 10/22/2017   PLT 184 10/22/2017    Radiographic Findings: No results found.  Impression/Plan: Healing well from radiotherapy  to the breast tissue.   Continue skin care with topical Vitamin E Oil and / or lotion for at least 2 more months for further healing.  I encouraged her to continue with yearly mammography as appropriate (for intact breast tissue) and followup with medical oncology. I will see her back on an as-needed basis. I have encouraged her to call if she has any issues or concerns in the future. I wished her the very best. _____________________________________   Eppie Gibson, MD  This document serves as a record of services personally performed  by Eppie Gibson, MD. It was created on her behalf by Rae Lips, a trained medical scribe. The creation of this record is based on the scribe's personal observations and the provider's statements to them. This document has been checked and approved by the attending provider.

## 2017-11-19 ENCOUNTER — Ambulatory Visit: Payer: BLUE CROSS/BLUE SHIELD | Admitting: Hematology and Oncology

## 2017-11-19 ENCOUNTER — Telehealth: Payer: Self-pay | Admitting: *Deleted

## 2017-11-19 NOTE — Telephone Encounter (Signed)
Patient called and left a voice mail stating,"I am not coming to my appointment today, because I just had my stent removed and I am in a lot of pain. "

## 2017-11-20 ENCOUNTER — Other Ambulatory Visit: Payer: Self-pay | Admitting: Urology

## 2017-11-20 ENCOUNTER — Telehealth: Payer: Self-pay

## 2017-11-20 DIAGNOSIS — M545 Low back pain: Secondary | ICD-10-CM

## 2017-11-20 NOTE — Telephone Encounter (Signed)
Pt called to ask if pt is due for any breast mri soon or diagnostic testing, since she is scheduled for Lumbar MRI with her urologist in the next week. Pt has hx of lumpectomy and had a recent MM in the last 6 months. Told pt that she does not need breast MRI at this time. Pt verbalized understanding. She is still having a lot of pain in her back, even after having a stent placed for her kidney stone. Told pt to proceed with the MRI of the lumbar as instructed by her urologist. Pt voiced understanding and will call with updates soon.

## 2017-11-21 ENCOUNTER — Other Ambulatory Visit: Payer: Self-pay | Admitting: Urology

## 2017-11-21 ENCOUNTER — Ambulatory Visit (HOSPITAL_COMMUNITY)
Admission: RE | Admit: 2017-11-21 | Discharge: 2017-11-21 | Disposition: A | Discharge status: Home / Self Care | Payer: BLUE CROSS/BLUE SHIELD | Source: Ambulatory Visit | Attending: Urology | Admitting: Urology

## 2017-11-21 DIAGNOSIS — M545 Low back pain: Secondary | ICD-10-CM

## 2017-11-21 DIAGNOSIS — M47816 Spondylosis without myelopathy or radiculopathy, lumbar region: Secondary | ICD-10-CM | POA: Diagnosis not present

## 2017-11-21 DIAGNOSIS — M48061 Spinal stenosis, lumbar region without neurogenic claudication: Secondary | ICD-10-CM | POA: Diagnosis not present

## 2017-11-26 ENCOUNTER — Telehealth: Payer: Self-pay | Admitting: Hematology and Oncology

## 2017-11-26 ENCOUNTER — Inpatient Hospital Stay: Payer: BLUE CROSS/BLUE SHIELD | Attending: Hematology and Oncology | Admitting: Hematology and Oncology

## 2017-11-26 DIAGNOSIS — M545 Low back pain, unspecified: Secondary | ICD-10-CM

## 2017-11-26 DIAGNOSIS — C50411 Malignant neoplasm of upper-outer quadrant of right female breast: Secondary | ICD-10-CM | POA: Diagnosis not present

## 2017-11-26 DIAGNOSIS — R208 Other disturbances of skin sensation: Secondary | ICD-10-CM

## 2017-11-26 DIAGNOSIS — Z170 Estrogen receptor positive status [ER+]: Secondary | ICD-10-CM

## 2017-11-26 MED ORDER — VALACYCLOVIR HCL 1 G PO TABS: 1000.0000 mg | ORAL_TABLET | Freq: Two times a day (BID) | ORAL | 0 refills | Status: DC

## 2017-11-26 MED ORDER — TRAMADOL HCL 50 MG PO TABS: 100.0000 mg | ORAL_TABLET | Freq: Four times a day (QID) | ORAL | 0 refills | Status: DC | PRN

## 2017-11-26 MED ORDER — LETROZOLE 2.5 MG PO TABS: 2.5000 mg | ORAL_TABLET | Freq: Every day | ORAL | 3 refills | Status: DC

## 2017-11-26 MED ORDER — GABAPENTIN 300 MG PO CAPS: 300.0000 mg | ORAL_CAPSULE | Freq: Every day | ORAL | 3 refills | Status: DC

## 2017-11-26 NOTE — Assessment & Plan Note (Signed)
03/19/2017: Right lumpectomy: IDC with DCIS, 1.2 cm, grade 2, margins negative, 0/2 lymph nodes negative, ER 100%, PR 10%, HER-2 negative ratio 1.73, Ki-67 40% T1BN0 stage IA CHEK 2 Mutation Positive Oncotype Dx score: 41 (28% ROR with Tamoxifen alone) Treatment plan: 1. Adjuvant chemotherapy with dose dense Adriamycin Cytoxan 4 followed by weekly Taxol 12 2. followed by adjuvant radiation 09/25/17-10/15/17 3. Followed by adjuvant antiestrogen therapy ----------------------------------------------------------------------------  Antiestrogen therapy has been delayed until the back pain improves. Back pain evaluation: CT abdomen pelvis 10/07/2017 no metastatic disease, bilateral kidney stones.  Sees urology.  I recommended that we initiate her antiestrogen therapy at this time.

## 2017-11-26 NOTE — Telephone Encounter (Signed)
Gave avs and calendar ° °

## 2017-11-26 NOTE — Progress Notes (Signed)
Patient Care Team: [name], MD as PCP - General (Internal Medicine) [name], MD (Internal Medicine) Arta Silence, MD as Consulting Physician (Gastroenterology) Arvella Nigh, MD as Consulting Physician (Obstetrics and Gynecology) Nicholas Lose, MD as Consulting Physician (Hematology and Oncology) Eppie Gibson, MD as Attending Physician (Radiation Oncology) Excell Seltzer, MD as Consulting Physician (General Surgery)  DIAGNOSIS:  Encounter Diagnoses  Name Primary?  . Carcinoma of upper-outer quadrant of right breast in female, estrogen receptor positive ([place])   . Allodynia   . 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  The STAT Breast cancer panel offered by [facility] includes sequencing and rearrangement analysis for the following 9 genes:  ATM, BRCA1, BRCA2, CDH1, CHEK2, PALB2, PTEN, STK11 and TP53.   The report date is March 09, 2017.  Re-requitisioned to the common hereditary cancer panel and the melanoma panel.  The additional genes tested were negative.  The Hereditary Gene Panel offered by [facility] includes sequencing and/or deletion duplication testing of the following 46 genes: APC, ATM, AXIN2, BARD1, BMPR1A, BRCA1, BRCA2, BRIP1, CDH1, CDKN2A (p14ARF), CDKN2A (p16INK4a), CHEK2, CTNNA1, DICER1, EPCAM (Deletion/duplication testing only),  GREM1 (promoter region deletion/duplication testing only), KIT, MEN1, MLH1, MSH2, MSH3, MSH6, MUTYH, NBN, NF1, NHTL1, PALB2, PDGFRA, PMS2, POLD1, POLE, PTEN, RAD50, RAD51C, RAD51D, SDHB, SDHC, SDHD, SMAD4, SMARCA4. STK11, TP53, TSC1, TSC2, and VHL.  The following genes were evaluated for sequence changes only: SDHA and HOXB13 c.251G>A variant only.  The Melanoma panel offered by [facility] includes sequencing and/or deletion duplication testing of the following 12 genes: BAP1, BRCA1, BRCA2, BRIP1, CDK4, CDKN2A (p14ARF), CDKN2A (p16INK4a), MC1R, POT1, PTEN, RB1, TERT, and TP53.  The following gene was evaluated for sequence changes only: MITF (c.952G>A, p.GLU318Lys variant only). The updated report date is March 14, 2017.        03/19/2017 Surgery    Right lumpectomy: IDC with DCIS, 1.2 cm, grade 2, margins negative, 0/2 lymph nodes negative, ER 100%, PR 10%, HER-2 negative ratio 1.73, Ki-67 40% T1BN0 stage IA      04/01/2017 Oncotype testing    Oncotype Dx score: 41 (28% ROR with Tamoxifen alone)      04/16/2017 - 08/27/2017 Chemotherapy    Dose dense Adriamycin and Cytoxan 4 followed by Taxol weekly 12       09/24/2017 - 10/17/2017 Radiation Therapy    Adjuvant radiation       CHIEF COMPLIANT: Follow-up after adjuvant radiation  INTERVAL HISTORY: Rebecca [name] is a 62 year old with above-mentioned history of right breast cancer treated with lumpectomy followed by adjuvant chemotherapy and she completed radiation therapy by February.  She is here today to discuss adjuvant treatment plan.  She has not started antiestrogen therapy because of back pain issues.  She had kidney stones and had them taken out by urology in the left  side..  She had a CT abdomen pelvis which did not show any metastatic disease.  She had an MRI of her lumbar spine which did not show any evidence of spinal cord compression.  She has this way discomfort extending from the back along the dermatomal distribution to the front.  This  is not associated with any skin rashes or shingles.  REVIEW OF SYSTEMS:   Constitutional: Denies fevers, chills or abnormal weight loss Eyes: Denies blurriness of vision Ears, nose, mouth, throat, and face: Denies mucositis or sore throat Respiratory: Denies cough, dyspnea or wheezes Cardiovascular: Denies palpitation, chest discomfort Gastrointestinal:  Denies nausea, heartburn or change in bowel habits Skin: Denies abnormal skin rashes Lymphatics: Denies new lymphadenopathy or easy bruising Neurological: Pain along dermatomal distribution from the back into the front Behavioral/Psych: Mood is stable, no new changes  Extremities: No lower extremity edema Breast:  denies any pain or lumps or nodules in either breasts All other systems were reviewed with the patient and are negative.  I have reviewed the past medical history, past surgical history, social history and family history with the patient and they are unchanged from previous note.  ALLERGIES:  is allergic to citalopram; citalopram; dexamethasone; dexamethasone; dilaudid [hydromorphone hcl]; propoxycaine; red yeast rice [cholestin]; rofecoxib; and sulfamethoxazole.  MEDICATIONS:  Current Outpatient Medications  Medication Sig Dispense Refill  . gabapentin (NEURONTIN) 300 MG capsule Take 1 capsule (300 mg total) by mouth at bedtime. 60 capsule 3  . ibuprofen (ADVIL,MOTRIN) 200 MG tablet Take 200 mg by mouth every 6 (six) hours as needed for mild pain.    Marland Kitchen letrozole (FEMARA) 2.5 MG tablet Take 1 tablet (2.5 mg total) by mouth daily. 90 tablet 3  . LORazepam (ATIVAN) 0.5 MG tablet TAKE 1 TABLET BY MOUTH AT BEDTIME AS NEEDED 30 tablet 1  . traMADol (ULTRAM) 50 MG tablet Take 2 tablets (100 mg total) by mouth every 6 (six) hours as needed. 90 tablet 0  . valACYclovir (VALTREX) 1000 MG tablet Take 1 tablet (1,000 mg total) by mouth 2 (two) times daily. 14 tablet 0   No current facility-administered medications for this visit.      PHYSICAL EXAMINATION: ECOG PERFORMANCE STATUS: 1 - Symptomatic but completely ambulatory  Vitals:   11/26/17 1358  BP: 136/68  Pulse: 78  Resp: 17  Temp: 97.7 F (36.5 C)  SpO2: 100%   Filed Weights   11/26/17 1358  Weight: 165 lb 12.8 oz (75.2 kg)    GENERAL:alert, no distress and comfortable SKIN: skin color, texture, turgor are normal, no rashes or significant lesions EYES: normal, Conjunctiva are pink and non-injected, sclera clear OROPHARYNX:no exudate, no erythema and lips, buccal mucosa, and tongue normal  NECK: supple, thyroid normal size, non-tender, without nodularity LYMPH:  no palpable lymphadenopathy in the cervical, axillary or inguinal LUNGS: clear to auscultation and percussion with normal breathing effort HEART: regular rate & rhythm and no murmurs and no lower extremity edema ABDOMEN:abdomen soft, non-tender and normal bowel sounds MUSCULOSKELETAL:no cyanosis of digits and no clubbing  NEURO: alert & oriented x 3 with fluent speech, no focal motor/sensory deficits EXTREMITIES: No lower extremity edema BREAST: No palpable masses or nodules in either right or left breasts. No palpable axillary supraclavicular or infraclavicular adenopathy no breast tenderness or nipple discharge. (exam performed in the presence of a chaperone)  LABORATORY DATA:  I have reviewed the data as listed CMP Latest Ref Rng & Units 10/22/2017 10/02/2017 08/27/2017  Glucose 70 - 140 mg/dL 101 96  80  BUN 7 - 26 mg/dL 12 16 14.3  Creatinine 0.60 - 1.10 mg/dL 0.73 0.67 0.7  Sodium 136 - 145 mmol/L 140 137 139  Potassium 3.5 - 5.1 mmol/L 4.1 4.1 4.0  Chloride 98 - 109 mmol/L 105 103 -  CO2 22 - 29 mmol/L '26 26 25  '$ Calcium 8.4 - 10.4 mg/dL 9.0 8.9 8.8  Total Protein 6.4 - 8.3 g/dL 6.8 6.7 6.4  Total Bilirubin 0.2 - 1.2 mg/dL 0.8 0.9 0.62  Alkaline Phos 40 - 150 U/L 66 70 56  AST 5 - 34 U/L 21 22 43(H)  ALT 0 - 55 U/L 25 31 71(H)    Lab Results  Component Value Date   WBC 4.2  10/22/2017   HGB 13.2 10/22/2017   HCT 39.5 10/22/2017   MCV 91.4 10/22/2017   PLT 184 10/22/2017   NEUTROABS 2.7 10/22/2017    ASSESSMENT & PLAN:  Carcinoma of upper-outer quadrant of right breast in female, estrogen receptor positive ([place]) 03/19/2017: Right lumpectomy: IDC with DCIS, 1.2 cm, grade 2, margins negative, 0/2 lymph nodes negative, ER 100%, PR 10%, HER-2 negative ratio 1.73, Ki-67 40% T1BN0 stage IA CHEK 2 Mutation Positive Oncotype Dx score: 41 (28% ROR with Tamoxifen alone) Treatment plan: 1. Adjuvant chemotherapy with dose dense Adriamycin Cytoxan 4 followed by weekly Taxol 12 2. followed by adjuvant radiation 09/25/17-10/15/17 3. Followed by adjuvant antiestrogen therapy ----------------------------------------------------------------------------  Antiestrogen therapy has been delayed because of back pain  Back pain evaluation: CT abdomen pelvis 10/07/2017 no metastatic disease, bilateral kidney stones.  Sees urology. Neuropathic pain: I gave her a week of antiviral therapy with Valtrex because her symptoms are classic for shingles.  She does not have the maculopapular rash.  I also recommended starting gabapentin 300 mg once a day for 3 days and then go up to 300 mg twice a day subsequently.  I also recommended that she start antiestrogen therapy with letrozole at this time. We discussed the risks and benefits of anti-estrogen therapy with aromatase inhibitors. These include but not limited to insomnia, hot flashes, mood changes, vaginal dryness, bone density loss, and weight gain. We strongly believe that the benefits far outweigh the risks. Patient understands these risks and consented to starting treatment. Planned treatment duration is 5-7 years.  Return to clinic in 2 months for evaluation on this treatment.   No orders of the defined types were placed in this encounter.  The patient has a good understanding of the overall plan. she agrees with it. she will call  with any problems that may develop before the next visit here.   Harriette Ohara, MD 11/26/17

## 2017-11-27 ENCOUNTER — Telehealth: Payer: Self-pay | Admitting: *Deleted

## 2017-11-27 NOTE — Telephone Encounter (Signed)
Received call from pt stating that she is having some nausea & headache & diarrhea.  She started 3 new meds yesterday.  She wants to know if she can take her anti-nausea meds with new meds.  Informed OK to take & may take tylenol for h/a.  Suggested she call back if symptoms not improved.

## 2017-11-28 ENCOUNTER — Other Ambulatory Visit: Payer: Self-pay | Admitting: Hematology and Oncology

## 2017-11-28 ENCOUNTER — Telehealth: Payer: Self-pay | Admitting: Medical Oncology

## 2017-11-28 DIAGNOSIS — M545 Low back pain, unspecified: Secondary | ICD-10-CM

## 2017-11-28 NOTE — Telephone Encounter (Signed)
Gabapentin reaction ? Took gabapentin wed night -sick all day Thursday w vomiting , bad headache. Did not take dose Thursday night -feels fine today- She continues taking her Valtrex . I told her to stop gabapentin and I would notify [place]

## 2017-12-01 ENCOUNTER — Encounter: Payer: Self-pay | Admitting: *Deleted

## 2017-12-03 ENCOUNTER — Telehealth: Payer: Self-pay

## 2017-12-03 NOTE — Telephone Encounter (Signed)
Returned pt call regarding request to have her valtrex refilled for shingles. Told pt that Dr.Gudena only wanted her to take for 1 week. Pt is experiencing some nerve pain in her back. Pt encouraged to take her gabapentin to help with pain control. She states that the first time she took gabapentin, she became nauseated and never took it again. Pt did take zofran and had relief afterwards. Suggested that pt try to take zofran 1 hr prior to taking her gabapentin, and preferably at night, to see if it helps her tolerate the medicine better. Pt verbalized understanding and will try these at home.   Pt also started letrozole po and currently tolerating with no issues. Pt will call back next week if she has any more issues with gabapentin and if her nerve pain does not improve. No further questions at this time.

## 2017-12-03 NOTE — Telephone Encounter (Signed)
Error entry

## 2018-01-06 ENCOUNTER — Telehealth: Payer: Self-pay

## 2018-01-06 NOTE — Telephone Encounter (Signed)
Pt would like to have her records sent to Dr.Oulaw's office for a colonoscopy in the next few weeks. Told pt that their office will need to send [place] a medical release of records to be sent to our medical records dept. Provided pt with fax number to our office.

## 2018-01-07 ENCOUNTER — Telehealth: Payer: Self-pay | Admitting: Hematology and Oncology

## 2018-01-07 ENCOUNTER — Other Ambulatory Visit: Payer: Self-pay | Admitting: Hematology and Oncology

## 2018-01-07 DIAGNOSIS — C50411 Malignant neoplasm of upper-outer quadrant of right female breast: Secondary | ICD-10-CM

## 2018-01-07 DIAGNOSIS — Z170 Estrogen receptor positive status [ER+]: Secondary | ICD-10-CM

## 2018-01-07 NOTE — Telephone Encounter (Signed)
FAXED RECORDS TO [facility] RELEASE ID [id number]

## 2018-01-08 ENCOUNTER — Other Ambulatory Visit: Payer: Self-pay

## 2018-01-08 DIAGNOSIS — Z170 Estrogen receptor positive status [ER+]: Secondary | ICD-10-CM

## 2018-01-08 DIAGNOSIS — C50411 Malignant neoplasm of upper-outer quadrant of right female breast: Secondary | ICD-10-CM

## 2018-01-08 MED ORDER — LORAZEPAM 0.5 MG PO TABS: 0.5000 mg | ORAL_TABLET | Freq: Every evening | ORAL | 1 refills | Status: DC | PRN

## 2018-01-13 ENCOUNTER — Other Ambulatory Visit: Payer: Self-pay

## 2018-01-13 MED ORDER — VALACYCLOVIR HCL 1 G PO TABS: 1000.0000 mg | ORAL_TABLET | Freq: Two times a day (BID) | ORAL | 0 refills | Status: DC

## 2018-01-28 ENCOUNTER — Inpatient Hospital Stay: Payer: BLUE CROSS/BLUE SHIELD | Attending: Hematology and Oncology | Admitting: Hematology and Oncology

## 2018-01-28 ENCOUNTER — Telehealth: Payer: Self-pay | Admitting: Hematology and Oncology

## 2018-01-28 DIAGNOSIS — Z170 Estrogen receptor positive status [ER+]: Secondary | ICD-10-CM | POA: Diagnosis not present

## 2018-01-28 DIAGNOSIS — C50411 Malignant neoplasm of upper-outer quadrant of right female breast: Secondary | ICD-10-CM | POA: Insufficient documentation

## 2018-01-28 DIAGNOSIS — Z79811 Long term (current) use of aromatase inhibitors: Secondary | ICD-10-CM | POA: Insufficient documentation

## 2018-01-28 NOTE — Progress Notes (Signed)
Patient Care Team: [name], MD as PCP - General (Internal Medicine) [name], MD (Internal Medicine) Arta Silence, MD as Consulting Physician (Gastroenterology) Arvella Nigh, MD as Consulting Physician (Obstetrics and Gynecology) Nicholas Lose, MD as Consulting Physician (Hematology and Oncology) Eppie Gibson, MD as Attending Physician (Radiation Oncology) Excell Seltzer, MD as Consulting Physician (General Surgery)  DIAGNOSIS:  Encounter Diagnosis  Name Primary?  . 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  The STAT Breast cancer panel offered by [facility] includes sequencing and rearrangement analysis for the following 9 genes:  ATM, BRCA1, BRCA2, CDH1, CHEK2, PALB2, PTEN, STK11 and TP53.   The report date is March 09, 2017.  Re-requitisioned to the common hereditary cancer panel and the melanoma panel.  The additional genes tested were negative.  The Hereditary Gene Panel offered by [facility] includes sequencing and/or deletion duplication testing of the following 46 genes: APC, ATM, AXIN2, BARD1, BMPR1A, BRCA1, BRCA2, BRIP1, CDH1, CDKN2A (p14ARF), CDKN2A (p16INK4a), CHEK2, CTNNA1, DICER1, EPCAM (Deletion/duplication testing only), GREM1 (promoter region deletion/duplication testing only), KIT,  MEN1, MLH1, MSH2, MSH3, MSH6, MUTYH, NBN, NF1, NHTL1, PALB2, PDGFRA, PMS2, POLD1, POLE, PTEN, RAD50, RAD51C, RAD51D, SDHB, SDHC, SDHD, SMAD4, SMARCA4. STK11, TP53, TSC1, TSC2, and VHL.  The following genes were evaluated for sequence changes only: SDHA and HOXB13 c.251G>A variant only.  The Melanoma panel offered by [facility] includes sequencing and/or deletion duplication testing of the following 12 genes: BAP1, BRCA1, BRCA2, BRIP1, CDK4, CDKN2A (p14ARF), CDKN2A (p16INK4a), MC1R, POT1, PTEN, RB1, TERT, and TP53.  The following gene was evaluated for sequence changes only: MITF (c.952G>A, p.GLU318Lys variant only). The updated report date is March 14, 2017.        03/19/2017 Surgery    Right lumpectomy: IDC with DCIS, 1.2 cm, grade 2, margins negative, 0/2 lymph nodes negative, ER 100%, PR 10%, HER-2 negative ratio 1.73, Ki-67 40% T1BN0 stage IA      04/01/2017 Oncotype testing    Oncotype Dx score: 41 (28% ROR with Tamoxifen alone)      04/16/2017 - 08/27/2017 Chemotherapy    Dose dense Adriamycin and Cytoxan 4 followed by Taxol weekly 12       09/24/2017 - 10/17/2017 Radiation Therapy    Adjuvant radiation      11/28/2017 -  Anti-estrogen oral therapy    Letrozole 2.5 mg daily       CHIEF COMPLIANT: Follow-up on letrozole therapy  INTERVAL HISTORY: Rebecca [name] is a 62 year old with above-mentioned history of right breast cancer is currently on letrozole therapy.  She has been tolerating letrozole moderately well.  She had shingles which was treated with Valtrex and that has resolved.  She no longer has pain related to that.  She feels depressed towards the end of the day and has  been crying at the end of the day as well.  This is been since she started letrozole.  But it also coincided with the time where she had a quarter with her boss and since then she has not been treated kindly at her workplace.  She was crying today because she has worked through her entire chemotherapy and she did not  get a raise while others in her office did get the raise .  REVIEW OF SYSTEMS:   Constitutional: Denies fevers, chills or abnormal weight loss Eyes: Denies blurriness of vision Ears, nose, mouth, throat, and face: Denies mucositis or sore throat Respiratory: Denies cough, dyspnea or wheezes Cardiovascular: Denies palpitation, chest discomfort Gastrointestinal:  Denies nausea, heartburn or change in bowel habits Skin: Denies abnormal skin rashes Lymphatics: Denies new lymphadenopathy or easy bruising Neurological:Denies numbness, tingling or new weaknesses Behavioral/Psych: Mood is stable, no new changes  Extremities: No lower extremity edema Breast: Tenderness in the right axilla All other systems were reviewed with the patient and are negative.  I have reviewed the past medical history, past surgical history, social history and family history with the patient and they are unchanged from previous note.  ALLERGIES:  is allergic to citalopram; citalopram; dexamethasone; dexamethasone; dilaudid [hydromorphone hcl]; propoxycaine; red yeast rice [cholestin]; rofecoxib; and sulfamethoxazole.  MEDICATIONS:  Current Outpatient Medications  Medication Sig Dispense Refill  . ibuprofen (ADVIL,MOTRIN) 200 MG tablet Take 200 mg by mouth every 6 (six) hours as needed for mild pain.    Marland Kitchen letrozole (FEMARA) 2.5 MG tablet Take 1 tablet (2.5 mg total) by mouth daily. 90 tablet 3  . LORazepam (ATIVAN) 0.5 MG tablet Take 1 tablet (0.5 mg total) by mouth at bedtime as needed. 30 tablet 1   No current facility-administered medications for this visit.     PHYSICAL EXAMINATION: ECOG PERFORMANCE STATUS: 1 - Symptomatic but completely ambulatory  Vitals:   01/28/18 1142  BP: 119/69  Pulse: 75  Resp: 18  Temp: 98.8 F (37.1 C)  SpO2: 99%   Filed Weights   01/28/18 1142  Weight: 162 lb 12.8 oz (73.8 kg)    GENERAL:alert, no distress and comfortable SKIN: skin color, texture, turgor are normal,  no rashes or significant lesions EYES: normal, Conjunctiva are pink and non-injected, sclera clear OROPHARYNX:no exudate, no erythema and lips, buccal mucosa, and tongue normal  NECK: supple, thyroid normal size, non-tender, without nodularity LYMPH:  no palpable lymphadenopathy in the cervical, axillary or inguinal LUNGS: clear to auscultation and percussion with normal breathing effort HEART: regular rate & rhythm and no murmurs and no lower extremity edema ABDOMEN:abdomen soft, non-tender and normal bowel sounds MUSCULOSKELETAL:no cyanosis of digits and no clubbing  NEURO: alert & oriented x 3 with fluent speech, no focal motor/sensory deficits EXTREMITIES: No lower extremity edema  LABORATORY DATA:  I have reviewed the data as listed CMP Latest Ref Rng & Units 10/22/2017 10/02/2017 08/27/2017  Glucose 70 - 140 mg/dL 101 96 80  BUN 7 - 26 mg/dL 12 16 14.3  Creatinine 0.60 - 1.10 mg/dL 0.73 0.67 0.7  Sodium 136 - 145 mmol/L 140 137 139  Potassium 3.5 - 5.1 mmol/L 4.1 4.1 4.0  Chloride 98 - 109 mmol/L 105 103 -  CO2 22 - 29 mmol/L '26 26 25  '$ Calcium 8.4 - 10.4 mg/dL 9.0 8.9 8.8  Total Protein 6.4 - 8.3 g/dL 6.8 6.7 6.4  Total Bilirubin 0.2 - 1.2 mg/dL 0.8 0.9 0.62  Alkaline Phos 40 - 150 U/L 66 70  56  AST 5 - 34 U/L 21 22 43(H)  ALT 0 - 55 U/L 25 31 71(H)    Lab Results  Component Value Date   WBC 4.2 10/22/2017   HGB 13.2 10/22/2017   HCT 39.5 10/22/2017   MCV 91.4 10/22/2017   PLT 184 10/22/2017   NEUTROABS 2.7 10/22/2017    ASSESSMENT & PLAN:  Carcinoma of upper-outer quadrant of right breast in female, estrogen receptor positive ([place]) 03/19/2017: Right lumpectomy: IDC with DCIS, 1.2 cm, grade 2, margins negative, 0/2 lymph nodes negative, ER 100%, PR 10%, HER-2 negative ratio 1.73, Ki-67 40% T1BN0 stage IA CHEK 2 Mutation Positive Oncotype Dx score: 41 (28% ROR with Tamoxifen alone) Treatment plan: 1. Adjuvant chemotherapy with dose dense Adriamycin Cytoxan 4 followed by  weekly Taxol 12 2. followed by adjuvant radiation1/31/19-2/20/19 3. Followed by adjuvant antiestrogen therapy ----------------------------------------------------------------------------  Current treatment: Letrozole 2.5 mg daily started 11/26/2017 Letrozole toxicities: 1.  Depression towards the end of the day: Patient also has a boss who has not been kind towards her 2.  Muscle cramps: Instructed her to take letrozole at bedtime  Patient has a grand child coming in July.  She is going to [place] to see the baby.  She is very excited about that.  Return to clinic in 1 year for follow-up.  Patient will call us if she needs antidepressants    No orders of the defined types were placed in this encounter.  The patient has a good understanding of the overall plan. she agrees with it. she will call with any problems that may develop before the next visit here.   Harriette Ohara, MD 01/28/18

## 2018-01-28 NOTE — Telephone Encounter (Signed)
Gave avs and calendar ° °

## 2018-01-28 NOTE — Assessment & Plan Note (Signed)
03/19/2017: Right lumpectomy: IDC with DCIS, 1.2 cm, grade 2, margins negative, 0/2 lymph nodes negative, ER 100%, PR 10%, HER-2 negative ratio 1.73, Ki-67 40% T1BN0 stage IA CHEK 2 Mutation Positive Oncotype Dx score: 41 (28% ROR with Tamoxifen alone) Treatment plan: 1. Adjuvant chemotherapy with dose dense Adriamycin Cytoxan 4 followed by weekly Taxol 12 2. followed by adjuvant radiation1/31/19-2/20/19 3. Followed by adjuvant antiestrogen therapy ----------------------------------------------------------------------------  Current treatment: Letrozole 2.5 mg daily started 11/26/2017 Letrozole toxicities: 1.  Depression towards the end of the day: Patient also has a boss who has not been kind towards her 2.  Muscle cramps: Instructed her to take letrozole at bedtime  Patient has a grand child coming in July.  She is going to [place] to see the baby.  She is very excited about that.  Return to clinic in 1 year for follow-up.  Patient will call us if she needs antidepressants

## 2018-03-10 LAB — HM COLONOSCOPY

## 2018-03-16 ENCOUNTER — Ambulatory Visit (INDEPENDENT_AMBULATORY_CARE_PROVIDER_SITE_OTHER): Payer: BLUE CROSS/BLUE SHIELD

## 2018-03-16 ENCOUNTER — Ambulatory Visit: Payer: Self-pay

## 2018-03-16 DIAGNOSIS — Z23 Encounter for immunization: Secondary | ICD-10-CM | POA: Diagnosis not present

## 2018-03-17 ENCOUNTER — Ambulatory Visit: Payer: Self-pay

## 2018-03-26 ENCOUNTER — Encounter: Payer: Self-pay | Admitting: *Deleted

## 2018-03-31 ENCOUNTER — Other Ambulatory Visit: Payer: Self-pay | Admitting: Obstetrics and Gynecology

## 2018-03-31 DIAGNOSIS — Z853 Personal history of malignant neoplasm of breast: Secondary | ICD-10-CM

## 2018-04-08 ENCOUNTER — Telehealth: Payer: Self-pay

## 2018-04-08 ENCOUNTER — Encounter: Payer: Self-pay | Admitting: Physician Assistant

## 2018-04-08 NOTE — Telephone Encounter (Signed)
Returned patient's call in regards to dry scalp.  Patient c/o dry scalp, small scabs on scalp.  Patient asking if she needs to see dermatologist.    Patient states irritation has been going on for a few months.  Patient has been scratching at areas.  Informed patient that Letrozole, can cause dryness of the skin including the scalp.  Recommendations included: use coconut oil to scalp at night, take OTC Biotin, and to drink plenty of fluids for hydration.   Patient voiced understanding and agreement.  Patient will call and follow up to let us know if she doesn't have any improvement.

## 2018-04-10 ENCOUNTER — Ambulatory Visit
Admission: RE | Admit: 2018-04-10 | Discharge: 2018-04-10 | Disposition: A | Discharge status: Home / Self Care | Payer: BLUE CROSS/BLUE SHIELD | Source: Ambulatory Visit | Attending: Obstetrics and Gynecology | Admitting: Obstetrics and Gynecology

## 2018-04-10 DIAGNOSIS — Z853 Personal history of malignant neoplasm of breast: Secondary | ICD-10-CM

## 2018-04-10 HISTORY — DX: Personal history of antineoplastic chemotherapy: Z92.21

## 2018-04-10 HISTORY — DX: Personal history of irradiation: Z92.3

## 2018-04-23 NOTE — Progress Notes (Signed)
Patient ID: Rebecca [name], female   DOB: July 17, 1956, 62 y.o.   MRN: [id number]  Complete Physical  Assessment and Plan:  Hyperlipidemia, unspecified hyperlipidemia type check lipids decrease fatty foods increase activity.  -     CBC with Differential/Platelet -     COMPLETE METABOLIC PANEL WITH GFR -     TSH -     Lipid panel  Other migraine without status migrainosus, not intractable Continue meds  Vitamin D deficiency -     VITAMIN D 25 Hydroxy (Vit-D Deficiency, Fractures)  CHEK2-related breast cancer (HCC)  Carcinoma of upper-outer quadrant of right breast in female, estrogen receptor positive (HCC)  Screening, anemia, deficiency, iron -     Cancel: Iron,Total/Total Iron Binding Cap -     Cancel: Vitamin B12  Screening for hematuria or proteinuria -     Urinalysis, Routine w reflex microscopic -     Microalbumin / creatinine urine ratio  Medication management -     Magnesium  Screening for cardiovascular condition -     EKG 12-Lead  Insomnia -     traZODone (DESYREL) 50 MG tablet; 1/2-1 tablet for sleep - try to take ativan AS NEEDED    Discussed med's effects and SE's. Screening labs and tests as requested with regular follow-up as recommended. Future Appointments  Date Time Provider [place]  01/29/2019  8:30 AM Nicholas Lose, MD [facility] None  05/04/2019  2:00 PM Liane Comber, NP [facility] None    HPI  62 y.o. female  presents for a complete physical.  Her blood pressure has been controlled at home, today their BP is BP: 120/74.  She does workout. She denies chest pain, shortness of breath, dizziness.   She had a Right lumpectomy on 03/19/2017 with Dr. Excell Seltzer: IDC with DCIS, 1.2 cm, grade 2, margins negative, 0/2 lymph nodes negative, ER 100%, PR 10%, HER-2 negative ratio 1.73, Ki-67 40% T1BN0 stage IA and has been getting systemic chemo, finished Jan 2019, follows with [facility], Dr. Lindi Adie.  She is on ativan PRN for sleep, does  not take nightly. Can not tolerate gabapentin.    She is on cholesterol medication, not fasting and denies myalgias. Her cholesterol is not at goal. The cholesterol last visit was:  Lab Results  Component Value Date   CHOL 255 (H) 04/07/2017   HDL 72 04/07/2017   LDLCALC 166 (H) 04/07/2017   TRIG 83 04/07/2017   CHOLHDL 3.5 04/07/2017  . She has been working on diet and exercise for prediabetes,  and denies foot ulcerations, hyperglycemia, hypoglycemia , increased appetite, nausea, paresthesia of the feet, polydipsia, polyuria, visual disturbances, vomiting and weight loss. Last A1C in the office was:  Lab Results  Component Value Date   HGBA1C 5.5 03/20/2016   Patient is on Vitamin D supplement, she can not tolerate Vitamin D supplementation, has tried all forms but they give her a headache.  Lab Results  Component Value Date   VD25OH 30 04/07/2017     BMI is Body mass index is 26.53 kg/m., she is working on diet and exercise. Wt Readings from Last 3 Encounters:  04/29/18 164 lb 6.4 oz (74.6 kg)  01/28/18 162 lb 12.8 oz (73.8 kg)  11/26/17 165 lb 12.8 oz (75.2 kg)     Current Medications:  Current Outpatient Medications on File Prior to Visit  Medication Sig Dispense Refill  . Biotin 1 MG CAPS Take by mouth.    Marland Kitchen ibuprofen (ADVIL,MOTRIN) 200 MG tablet Take 200  mg by mouth every 6 (six) hours as needed for mild pain.    Marland Kitchen letrozole (FEMARA) 2.5 MG tablet Take 1 tablet (2.5 mg total) by mouth daily. 90 tablet 3  . LORazepam (ATIVAN) 0.5 MG tablet Take 1 tablet (0.5 mg total) by mouth at bedtime as needed. 30 tablet 1   No current facility-administered medications on file prior to visit.     Health Maintenance:   Immunization History  Administered Date(s) Administered  . Influenza,inj,Quad PF,6+ Mos 06/25/2017  . Td 08/27/2007  . Tdap 03/16/2018   Tetanus: 2019 Flu vaccine:2018 Pap: July 2018 [facility]  01/2018  DEXA: Dr. Ophelia Charter 2019 AT [facility] Colonoscopy: 2019,  outlaw Last Eye Exam: Eye doctor Echo 2018  Patient Care Team: [name], MD as PCP - General (Internal Medicine) [name], MD (Internal Medicine) Arta Silence, MD as Consulting Physician (Gastroenterology) Arvella Nigh, MD as Consulting Physician (Obstetrics and Gynecology) Nicholas Lose, MD as Consulting Physician (Hematology and Oncology) Eppie Gibson, MD as Attending Physician (Radiation Oncology) Excell Seltzer, MD as Consulting Physician (General Surgery)  Medical History:  Past Medical History:  Diagnosis Date  . Depression    REMISSION/ SITUATIONAL  . Dyspnea   . Family history of brain cancer   . Family history of breast cancer   . Family history of colon cancer   . Family history of melanoma   . Hepatitis AGE 27   RESOLVED  . History of radiation therapy 09/24/17- 10/15/17   Right Breast treated to 42.56 Gy with 16 fx of 2.66 Gy  . Hyperlipidemia   . Insomnia   . Migraines    HORMONAL  . Personal history of chemotherapy   . Personal history of radiation therapy   . right breast ca dx' d 02/2017  . Shingles outbreak 2003, 2015   RIGHT HIP, and between breasts 3 years ago    Allergies Allergies  Allergen Reactions  . Citalopram Other (See Comments)    HYPERACTIVITY  . Citalopram     Hyper  . Dexamethasone Other (See Comments)    CHEST TIGHTNESS  . Dexamethasone     Chest tightness  . Dilaudid [Hydromorphone Hcl] Nausea And Vomiting  . Propoxycaine Nausea And Vomiting  . Red Yeast Rice [Cholestin] Other (See Comments)    headache  . Rofecoxib Nausea And Vomiting  . Sulfamethoxazole Swelling    SURGICAL HISTORY She  has a past surgical history that includes Knee arthroscopy (Right, 2002); Tubal ligation; uterine polyps (2016); Breast lumpectomy with radioactive seed and sentinel lymph node biopsy (Right, 03/19/2017); and Breast lumpectomy (Right, 02/2017). FAMILY HISTORY Her family history includes Breast cancer in her sister;  Cancer in her brother and paternal grandfather; Cancer (age of onset: 49) in her sister; Cancer (age of onset: 50) in her father; Cervical cancer in her maternal aunt; Dementia in her maternal aunt and mother; Depression in her brother; Heart disease in her maternal grandfather; Lung cancer in her maternal uncle; Lung cancer (age of onset: 75) in her brother; Other in her paternal grandmother; Stroke (age of onset: 15) in her maternal grandmother. SOCIAL HISTORY She  reports that she quit smoking about 9 years ago. She has never used smokeless tobacco. She reports that she does not drink alcohol or use drugs. 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    Review of Systems: Review of Systems  Constitutional: Negative for chills, fever and malaise/fatigue.  HENT: Negative for congestion, ear pain and sore throat.   Eyes: Negative.  Respiratory: Negative for cough, shortness of breath and wheezing.   Cardiovascular: Negative for chest pain, palpitations and leg swelling.  Gastrointestinal: Negative for abdominal pain, blood in stool, constipation, diarrhea, heartburn and melena.  Genitourinary: Negative.   Skin: Negative.   Neurological: Negative for dizziness, sensory change, loss of consciousness and headaches.  Psychiatric/Behavioral: Negative for depression, hallucinations, substance abuse and suicidal ideas. The patient is nervous/anxious. The patient does not have insomnia.     Physical Exam: Estimated body mass index is 26.53 kg/m as calculated from the following:   Height as of this encounter: '5\' 6"'$  (1.676 m).   Weight as of this encounter: 164 lb 6.4 oz (74.6 kg). BP 120/74   Pulse 71   Temp 98.7 F (37.1 C)   Resp 16   Ht '5\' 6"'$  (1.676 m)   Wt 164 lb 6.4 oz (74.6 kg)   LMP 08/03/2013   SpO2 97%   BMI 26.53 kg/m   General Appearance: Well nourished well developed, in no apparent distress.  Eyes: PERRLA, EOMs, conjunctiva no swelling or erythema ENT/Mouth: Ear  canals normal without obstruction, swelling, erythema, or discharge.  TMs normal bilaterally with no erythema, bulging, retraction, or loss of landmark.  Oropharynx moist and clear with no exudate, erythema, or swelling.   Neck: Supple, thyroid normal. No bruits.  No cervical adenopathy Respiratory: Respiratory effort normal, Breath sounds clear A&P without wheeze, rhonchi, rales.   Cardio: RRR without murmurs, rubs or gallops. Brisk peripheral pulses without edema.  Chest: symmetric, with normal excursions Abdomen: Soft, nontender, no guarding, rebound, hernias, masses, or organomegaly.  Lymphatics: Non tender without lymphadenopathy.  Musculoskeletal: Full ROM all peripheral extremities,5/5 strength, and normal gait.  Skin: Warm, dry without rashes, lesions, ecchymosis. Neuro: Awake and oriented X 3, Cranial nerves intact, reflexes equal bilaterally. Normal muscle tone, no cerebellar symptoms. Sensation intact.  Psych:  normal affect, Insight and Judgment appropriate.   Over 40 minutes of exam, counseling, chart review and critical decision making was performed  EKG WNL, no ST changes  Vicie Mutters 2:04 PM [facility]

## 2018-04-29 ENCOUNTER — Ambulatory Visit (INDEPENDENT_AMBULATORY_CARE_PROVIDER_SITE_OTHER): Payer: BLUE CROSS/BLUE SHIELD | Admitting: Physician Assistant

## 2018-04-29 ENCOUNTER — Encounter: Payer: Self-pay | Admitting: Physician Assistant

## 2018-04-29 VITALS — BP 120/74 | HR 71 | Temp 98.7°F | Resp 16 | Ht 66.0 in | Wt 164.4 lb

## 2018-04-29 DIAGNOSIS — Z1389 Encounter for screening for other disorder: Secondary | ICD-10-CM | POA: Diagnosis not present

## 2018-04-29 DIAGNOSIS — E559 Vitamin D deficiency, unspecified: Secondary | ICD-10-CM | POA: Diagnosis not present

## 2018-04-29 DIAGNOSIS — C50919 Malignant neoplasm of unspecified site of unspecified female breast: Secondary | ICD-10-CM

## 2018-04-29 DIAGNOSIS — Z0000 Encounter for general adult medical examination without abnormal findings: Secondary | ICD-10-CM | POA: Diagnosis not present

## 2018-04-29 DIAGNOSIS — Z1502 Genetic susceptibility to malignant neoplasm of ovary: Secondary | ICD-10-CM

## 2018-04-29 DIAGNOSIS — I10 Essential (primary) hypertension: Secondary | ICD-10-CM | POA: Diagnosis not present

## 2018-04-29 DIAGNOSIS — Z130 Encounter for screening for diseases of the blood and blood-forming organs and certain disorders involving the immune mechanism: Secondary | ICD-10-CM

## 2018-04-29 DIAGNOSIS — Z136 Encounter for screening for cardiovascular disorders: Secondary | ICD-10-CM | POA: Diagnosis not present

## 2018-04-29 DIAGNOSIS — G4700 Insomnia, unspecified: Secondary | ICD-10-CM

## 2018-04-29 DIAGNOSIS — C50411 Malignant neoplasm of upper-outer quadrant of right female breast: Secondary | ICD-10-CM

## 2018-04-29 DIAGNOSIS — Z79899 Other long term (current) drug therapy: Secondary | ICD-10-CM

## 2018-04-29 DIAGNOSIS — Z1329 Encounter for screening for other suspected endocrine disorder: Secondary | ICD-10-CM | POA: Diagnosis not present

## 2018-04-29 DIAGNOSIS — E785 Hyperlipidemia, unspecified: Secondary | ICD-10-CM

## 2018-04-29 DIAGNOSIS — Z1589 Genetic susceptibility to other disease: Secondary | ICD-10-CM

## 2018-04-29 DIAGNOSIS — Z13220 Encounter for screening for lipoid disorders: Secondary | ICD-10-CM

## 2018-04-29 DIAGNOSIS — G43809 Other migraine, not intractable, without status migrainosus: Secondary | ICD-10-CM

## 2018-04-29 DIAGNOSIS — Z170 Estrogen receptor positive status [ER+]: Secondary | ICD-10-CM

## 2018-04-29 DIAGNOSIS — Z1509 Genetic susceptibility to other malignant neoplasm: Secondary | ICD-10-CM

## 2018-04-29 MED ORDER — TRAZODONE HCL 50 MG PO TABS: ORAL_TABLET | ORAL | 2 refills | Status: DC

## 2018-04-29 NOTE — Patient Instructions (Addendum)
Try the trazodone This is not habit forming, you will not build a tolerance to it Please start out at 25mg  or 1/2 pill, can increase to 50mg  at night, and the max of this medication is 150mg  at night.   Side effects can be odd dreams, dry mouth, and drowsiness.   Benefiber or Citracel is good for constipation/diarrhea/irritable bowel syndrome, it helps with weight loss and can help lower your bad cholesterol. Please do 1 TBSP in the morning in water, coffee, or tea. It can take up to a month before you can see a difference with your bowel movements. It is cheapest from [employer]co, sam's, [employer].    10 Tips on Belching, Bloating, and Flatulence 1. Belching is caused by swallowed air from:  Eating or drinking too fast  Poorly fitting dentures; not chewing food completely  Carbonated beverages  Chewing gum or sucking on hard candies  Excessive swallowing due to nervous tension or postnasal drip  Forced belching to relieve abdominal discomfort 2. To prevent excessive belching, avoid:  Carbonated beverages  Chewing gum  Hard candies  Simethicone/GasX may be helpful  3. Abdominal bloating and discomfort may be due to intestinal sensitivity or symptoms of irritable bowel syndrome. To relieve symptoms, avoid:  Broccoli  Baked beans  Cabbage  Carbonated drinks  Cauliflower  Chewing gum  Hard candy 4. Abdominal distention resulting from weak abdominal muscles:  Is better in the morning  Gets worse as the day progresses  Is relieved by lying down 5. To prevent Abdominal distention:  Tighten abdominal muscles by pulling in your stomach several times during the day  Do sit-up exercises if possible  Wear an abdominal support garment if exercise is too difficult 6. Flatulence is gas created through bacterial action in the bowel and passed rectally. Keep in mind that:  10-18 passages per day are normal  Primary gases are harmless and odorless  Noticeable smells are trace gases related to food  intake 7. Foods that are likely to form gas include:  Milk, dairy products, and medications that contain lactose--If your body doesn't produce the enzyme (lactase) to break it down.  Certain vegetables--baked beans, cauliflower, broccoli, cabbage  Certain starches--wheat, oats, corn, potatoes. Rice is a good substitute. 8. Identify offending foods. Reduce or eliminate these gas-forming foods from your diet.   Consider keeping a food diary- common causes of diarrhea are dairy, certain carbs...  FODMAP stands for fermentable oligo-, di-, mono-saccharides and polyols (1). These are the scientific terms used to classify groups of carbs that are notorious for triggering digestive symptoms like bloating, gas and stomach pain.   FODMAPs are found in a wide range of foods in varying amounts. Some foods contain just one type, while others contain several.  The main dietary sources of the four groups of FODMAPs include:  Oligosaccharides: Wheat, rye, legumes and various fruits and vegetables, such as garlic and onions.  Disaccharides: Milk, yogurt and soft cheese. Lactose is the main carb.  Monosaccharides: Various fruit including figs and mangoes, and sweeteners such as honey and agave nectar. Fructose is the main carb.  Polyols: Certain fruits and vegetables including blackberries and lychee, as well as some low-calorie sweeteners like those in sugar-free gum.   Keep a food diary. This will help you identify foods that cause symptoms. Write down: ? What you eat and when. ? What symptoms you have. ? When symptoms occur in relation to your meals.  Avoid foods that cause symptoms. Talk with your dietitian about  other ways to get the same nutrients that are in these foods.  Eat your meals slowly, in a relaxed setting.  Aim to eat 5-6 small meals per day. Do not skip meals.  Drink enough fluids to keep your urine clear or pale yellow.  Ask your health care provider if you should take an  over-the-counter probiotic during flare-ups to help restore healthy gut bacteria.  If you have cramping or diarrhea, try making your meals low in fat and high in carbohydrates. Examples of carbohydrates are pasta, rice, whole grain breads and cereals, fruits, and vegetables.  If dairy products cause your symptoms to flare up, try eating less of them. You might be able to handle yogurt better than other dairy products because it contains bacteria that help with digestion.     VAGINAL DRYNESS OVERVIEW  Vaginal dryness, also known as atrophic vaginitis, is a common condition in postmenopausal women. This condition is also common in women who have had both ovaries removed at the time of hysterectomy.   Some women have uncomfortable symptoms of vaginal dryness, such as pain with sex, burning vaginal discomfort or itching, or abnormal vaginal discharge, while others have no symptoms at all.  VAGINAL DRYNESS CAUSES   Estrogen helps to keep the vagina moist and to maintain thickness of the vaginal lining. Vaginal dryness occurs when the ovaries produce a decreased amount of estrogen. This can occur at certain times in a woman's life, and may be permanent or temporary. Times when less estrogen is made include: ?At the time of menopause. ?After surgical removal of the ovaries, chemotherapy, or radiation therapy of the pelvis for cancer. ?After having a baby, particularly in women who breastfeed. ?While using certain medications, such as danazol, medroxyprogesterone (brand names: Provera or DepoProvera), leuprolide (brand name: Lupron), or nafarelin. When these medications are stopped, estrogen production resumes.  Women who smoke cigarettes have been shown to have an increased risk of an earlier menopause transition as compared to non-smokers. Therefore, atrophic vaginitis symptoms may appear at a younger age in this population.  VAGINAL DRYNESS TREATMENT   There are three treatment options for women  with vaginal dryness:  Vaginal lubricants and moisturizers - Vaginal lubricants and moisturizers can be purchased without a prescription. These products do not contain any hormones and have virtually no side effects. - Albolene is found in the facial cleanser section at [employer], [facility], or [employer]. It is a large jar with a blue top. This is the best lubricant for women because it is hypoallergenic. -Natural lubricants, such as olive, avocado or peanut oil, are easily available products that may be used as a lubricant with sex.  -Vaginal moisturizes (eg, Replens, Moist Again, Vagisil, K-Y Silk-E, and Feminease) are formulated to allow water to be retained in the vaginal tissues. Moisturizers are applied into the vagina three times weekly to allow a continued moisturizing effect. These should not be used just before having sex, as they can be irritating.

## 2018-04-30 LAB — MICROALBUMIN / CREATININE URINE RATIO
Creatinine, Urine: 46 mg/dL (ref 20–275)
Microalb Creat Ratio: 9 mcg/mg creat (ref <30)
Microalb, Ur: 0.4 mg/dL

## 2018-04-30 LAB — CBC WITH DIFFERENTIAL/PLATELET
Basophils Absolute: 47 cells/uL (ref 0–200)
Basophils Relative: 0.7 %
Eosinophils Absolute: 40 cells/uL (ref 15–500)
Eosinophils Relative: 0.6 %
HEMATOCRIT: 39.8 % (ref 35.0–45.0)
HEMOGLOBIN: 13.3 g/dL (ref 11.7–15.5)
LYMPHS ABS: 1554 {cells}/uL (ref 850–3900)
MCH: 30.7 pg (ref 27.0–33.0)
MCHC: 33.4 g/dL (ref 32.0–36.0)
MCV: 91.9 fL (ref 80.0–100.0)
MONOS PCT: 6.5 %
MPV: 11.3 fL (ref 7.5–12.5)
NEUTROS ABS: 4623 {cells}/uL (ref 1500–7800)
Neutrophils Relative %: 69 %
Platelets: 210 10*3/uL (ref 140–400)
RBC: 4.33 10*6/uL (ref 3.80–5.10)
RDW: 12.1 % (ref 11.0–15.0)
Total Lymphocyte: 23.2 %
WBC mixed population: 436 cells/uL (ref 200–950)
WBC: 6.7 10*3/uL (ref 3.8–10.8)

## 2018-04-30 LAB — LIPID PANEL
CHOLESTEROL: 243 mg/dL — AB (ref <200)
HDL: 67 mg/dL (ref >50)
LDL Cholesterol (Calc): 138 mg/dL (calc) — ABNORMAL HIGH
Non-HDL Cholesterol (Calc): 176 mg/dL (calc) — ABNORMAL HIGH (ref <130)
Total CHOL/HDL Ratio: 3.6 (calc) (ref <5.0)
Triglycerides: 236 mg/dL — ABNORMAL HIGH (ref <150)

## 2018-04-30 LAB — TSH: TSH: 1.69 mIU/L (ref 0.40–4.50)

## 2018-04-30 LAB — COMPLETE METABOLIC PANEL WITH GFR
AG Ratio: 1.8 (calc) (ref 1.0–2.5)
ALBUMIN MSPROF: 4.3 g/dL (ref 3.6–5.1)
ALT: 13 U/L (ref 6–29)
AST: 16 U/L (ref 10–35)
Alkaline phosphatase (APISO): 68 U/L (ref 33–130)
BUN: 12 mg/dL (ref 7–25)
CALCIUM: 9.2 mg/dL (ref 8.6–10.4)
CHLORIDE: 102 mmol/L (ref 98–110)
CO2: 29 mmol/L (ref 20–32)
CREATININE: 0.75 mg/dL (ref 0.50–0.99)
GFR, EST NON AFRICAN AMERICAN: 85 mL/min/{1.73_m2} (ref >=60)
GFR, Est African American: 99 mL/min/{1.73_m2} (ref >=60)
GLOBULIN: 2.4 g/dL (ref 1.9–3.7)
GLUCOSE: 73 mg/dL (ref 65–99)
Potassium: 3.9 mmol/L (ref 3.5–5.3)
Sodium: 139 mmol/L (ref 135–146)
Total Bilirubin: 0.8 mg/dL (ref 0.2–1.2)
Total Protein: 6.7 g/dL (ref 6.1–8.1)

## 2018-04-30 LAB — URINALYSIS, ROUTINE W REFLEX MICROSCOPIC
BILIRUBIN URINE: NEGATIVE
Glucose, UA: NEGATIVE
Hgb urine dipstick: NEGATIVE
Ketones, ur: NEGATIVE
Leukocytes, UA: NEGATIVE
Nitrite: NEGATIVE
Protein, ur: NEGATIVE
Specific Gravity, Urine: 1.013 (ref 1.001–1.03)
pH: 6.5 (ref 5.0–8.0)

## 2018-04-30 LAB — VITAMIN D 25 HYDROXY (VIT D DEFICIENCY, FRACTURES): Vit D, 25-Hydroxy: 27 ng/mL — ABNORMAL LOW (ref 30–100)

## 2018-04-30 LAB — MAGNESIUM: Magnesium: 1.9 mg/dL (ref 1.5–2.5)

## 2018-06-23 ENCOUNTER — Telehealth: Payer: Self-pay

## 2018-06-23 NOTE — Telephone Encounter (Signed)
Returned patient's call.  Patient inquiring if it's okay to take OTC supplement Tumeric with anti estrogen therapy.    Per Dr. Geralyn Flash recommendation, okay to OTC supplement at recommend dose on bottle.  Patient voiced understanding and agreement.  No further needs at this time.

## 2018-06-26 ENCOUNTER — Ambulatory Visit (INDEPENDENT_AMBULATORY_CARE_PROVIDER_SITE_OTHER): Payer: BLUE CROSS/BLUE SHIELD

## 2018-06-26 VITALS — Temp 97.7°F

## 2018-06-26 DIAGNOSIS — Z23 Encounter for immunization: Secondary | ICD-10-CM | POA: Diagnosis not present

## 2018-07-20 ENCOUNTER — Other Ambulatory Visit: Payer: Self-pay

## 2018-07-20 MED ORDER — VENLAFAXINE HCL ER 37.5 MG PO CP24: 37.5000 mg | ORAL_CAPSULE | Freq: Every day | ORAL | 0 refills | Status: DC

## 2018-07-20 MED ORDER — VENLAFAXINE HCL ER 75 MG PO CP24: 75.0000 mg | ORAL_CAPSULE | Freq: Every day | ORAL | 0 refills | Status: DC

## 2018-07-20 NOTE — Progress Notes (Signed)
Pt calling to see if Dr.Gudena would order the depression medication that they have discussed from her previous appt with him. Pt states that she is just having a hard time with work and dealing with her co workers. She is also not able to seek counseling d/t lack of funds. Pt states that she is not able to afford it at this time. Will seek advice from social workers to see if pt is able to have some counseling resources that are available.   Per Dr.Gudena, okay to start pt on venlafaxine daily. Will fill for 30 days and have pt call to update with how she is doing prior to more refills. Pt verbalized understanding and will call soon for updates. No further needs at this time.

## 2018-07-21 ENCOUNTER — Encounter: Payer: Self-pay | Admitting: *Deleted

## 2018-07-21 NOTE — Progress Notes (Signed)
[place] [employer]  Hol[employer] representative received referral from medical oncology for emotional support and counseling.  CSW contacted patient at home to offer support and assess for needs.  Patient stated she is experiencing increased stress at work and financially.  CSW and patient discussed counseling resources and patient was agreeable to a referral to the [facility] counseling intern.  CSW encouraged patient to call with any additional needs or concerns.    Johnnye Lana, MSW, LCSW, OSW-C Clinical Social Worker [facility] [phone]

## 2018-07-21 NOTE — Progress Notes (Signed)
Called pt to follow-up regarding interest in no cost counseling support at [facility]. The pt reported that she would like to schedule a counseling intake appt. The pt and counselor plan to meet on December 6 at 3 pm.  Doris Cheadle, Counseling Intern [phone]

## 2018-07-27 ENCOUNTER — Telehealth: Payer: Self-pay

## 2018-07-27 NOTE — Telephone Encounter (Signed)
Patient calling regarding changes in insurance.  Per patient, in January her insurance will require her to utilize [facility].  Patient inquiring about cost of paying out of pocket to continue care at [facility] vs referral.  Nurse provided patient with billing contact number.  Patient will call back with decision and inform if we need to place referral.  No further needs at this time.

## 2018-07-28 ENCOUNTER — Other Ambulatory Visit: Payer: Self-pay

## 2018-07-28 DIAGNOSIS — Z170 Estrogen receptor positive status [ER+]: Secondary | ICD-10-CM

## 2018-07-28 DIAGNOSIS — C50411 Malignant neoplasm of upper-outer quadrant of right female breast: Secondary | ICD-10-CM

## 2018-07-28 NOTE — Progress Notes (Signed)
Pt called to request referral to [facility] [place] (oncology/hematology). Due to insurance changes, pt will need to transfer care to [facility]. Dr.Gudena is aware and approved for referral.

## 2018-07-29 NOTE — Progress Notes (Signed)
Called the pt to confirm [facility] [name] counseling appt on December 6. Pt needed to reschedule and requested a call from the counselor at the beginning of January to set up another appt. The counselor invited the pt to call the Patient and [name] if any needs arise before January, and the pt expressed that she would reach out if needed.  Rebecca [name], Counseling Intern [phone]

## 2018-08-14 ENCOUNTER — Encounter: Payer: Self-pay | Admitting: Physician Assistant

## 2018-08-14 ENCOUNTER — Ambulatory Visit (INDEPENDENT_AMBULATORY_CARE_PROVIDER_SITE_OTHER): Payer: BLUE CROSS/BLUE SHIELD | Admitting: Physician Assistant

## 2018-08-14 VITALS — BP 122/74 | HR 77 | Temp 98.7°F | Ht 66.0 in | Wt 160.6 lb

## 2018-08-14 DIAGNOSIS — J392 Other diseases of pharynx: Secondary | ICD-10-CM | POA: Diagnosis not present

## 2018-08-14 NOTE — Patient Instructions (Signed)
Get humidifier at home Do capful of peroxide and water and gargle Get on allergy pill  ALLERGY MEDICATIONS OVER THE COUNTER  Please pick one of the over the counter allergy medications below and take it once daily for allergies.  Claritin or loratadine cheapest but likely the weakest  Zyrtec or certizine at night because it can make you sleepy The strongest is allegra or fexafinadine  Cheapest at walmart, sam's, [employer]co   What Causes Tonsil Stones? Your tonsils are filled with nooks and crannies where bacteria and other materials, including dead cells and mucous, can become trapped. When this happens, the debris can become concentrated in white formations that occur in the pockets. Tonsil stones, or tonsilloliths, are formed when this trapped debris hardens, or calcifies. This tends to happen most often in people who have chronic inflammation in their tonsils or repeated bouts of tonsillitis. While many people have small tonsilloliths that develop in their tonsils, it is quite rare to have a large and solidified tonsil stone.  How Are Tonsil Stones Treated? The appropriate treatment for a tonsil stone depends on the size of the tonsillolith and its potential to cause discomfort or harm. Options include: No treatment. Many tonsil stones, especially ones that have no symptoms, require no special treatment. At-home removal. Some people choose to dislodge tonsil stones at home with the use of picks or swabs. Salt water gargles. Gargling with warm, salty water may help ease the discomfort of tonsillitis, which often accompanies tonsil stones. Allergy pills- Taking allergy pill every day can decrease the inflammation and decrease the formation of tonsil stones Continue reading below...  Antibiotics. Various antibiotics can be used to treat tonsil stones. While they may be helpful for some people, they cannot correct the basic problem that is causing tonsilloliths. Also, antibiotics can have side  effects. Surgical removal. When tonsil stones are exceedingly large and symptomatic, it may be necessary for a surgeon to remove them. In certain instances, a doctor will be able to perform this relatively simple procedure using a local numbing agent. Then the patient will not need general anesthesia.  Can Tonsil Stones Be Prevented? Take an antihistamine daily can prevent inflammation and formation.  Since tonsil stones are more common in people who have chronic tonsillitis, the only surefire way to prevent them is with surgical removal of the tonsils. This procedure, known as a tonsillectomy, removes the tissues of the tonsils entirely, thereby eliminating the possibility of tonsillolith formation, however, adults are more likely to have complications from the surgery than children so it is considered a last resort.

## 2018-08-14 NOTE — Progress Notes (Signed)
Subjective:    Patient ID: Rebecca [name], female    DOB: 11-Dec-1955, 62 y.o.   MRN: [id number]  HPI 62 y.o. WF with history of breast cancer presents with dry scratchy throat x 2 weeks. She states last week has had rhinorrhea, did netipot that helped. Has been on claritin, advil allergy helped some. No trouble swallowing, no pain with swallowing, fever, chills, night sweats.   Blood pressure 122/74, pulse 77, temperature 98.7 F (37.1 C), height _0  (1.676 m), weight 160 lb 9.6 oz (72.8 kg), last menstrual period 08/03/2013, SpO2 97 %.  Medications Current Outpatient Medications on File Prior to Visit  Medication Sig  . Biotin 1 MG CAPS Take by mouth.  Marland Kitchen ibuprofen (ADVIL,MOTRIN) 200 MG tablet Take 200 mg by mouth every 6 (six) hours as needed for mild pain.  Marland Kitchen letrozole (FEMARA) 2.5 MG tablet Take 1 tablet (2.5 mg total) by mouth daily.  Marland Kitchen LORazepam (ATIVAN) 0.5 MG tablet Take 1 tablet (0.5 mg total) by mouth at bedtime as needed.  . traZODone (DESYREL) 50 MG tablet 1/2-1 tablet for sleep   No current facility-administered medications on file prior to visit.     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    Review of Systems See HPI    Objective:   Physical Exam Constitutional:      Appearance: She is well-developed.  HENT:     Right Ear: Hearing and external ear normal. A middle ear effusion is present. No mastoid tenderness. Tympanic membrane is injected. Tympanic membrane is not perforated, erythematous, retracted or bulging.     Left Ear: Hearing and external ear normal. A middle ear effusion is present. No mastoid tenderness.  Tympanic membrane is injected. Tympanic membrane is not perforated, erythematous, retracted or bulging.     Nose:     Right Sinus: Maxillary sinus tenderness present.     Left Sinus: Maxillary sinus tenderness present.     Mouth/Throat:     Mouth: Mucous membranes are moist.     Dentition: Normal dentition.     Pharynx: Oropharynx is clear. Uvula midline. Posterior oropharyngeal erythema present. No pharyngeal swelling, oropharyngeal exudate or uvula swelling.     Tonsils: No tonsillar exudate or tonsillar abscesses.  Eyes:     Conjunctiva/sclera: Conjunctivae normal.     Pupils: Pupils are equal, round, and reactive to light.  Neck:     Musculoskeletal: Neck supple.  Cardiovascular:     Rate and Rhythm: Normal rate and regular rhythm.  Pulmonary:     Effort: Pulmonary effort is normal. No respiratory distress.     Breath sounds: Normal breath sounds. No wheezing.  Abdominal:     General: Bowel sounds are normal.     Palpations: Abdomen is soft.  Musculoskeletal: Normal range of motion.  Lymphadenopathy:     Cervical: No cervical adenopathy.     Right cervical: No superficial, deep or posterior cervical adenopathy.    Left cervical: No superficial, deep or posterior cervical adenopathy.  Skin:    General: Skin is warm and dry.  Neurological:     Mental Status: She is alert and oriented to person, place, and time.  Assessment & Plan:  [place] was seen today for acute visit.  Diagnoses and all orders for this visit:  Throat dry Do mouth wash get on allergy pill, get humidifier If not better or any worsening symptoms call the office

## 2018-08-20 ENCOUNTER — Other Ambulatory Visit: Payer: Self-pay | Admitting: Hematology and Oncology

## 2018-08-20 DIAGNOSIS — C50411 Malignant neoplasm of upper-outer quadrant of right female breast: Secondary | ICD-10-CM

## 2018-08-20 DIAGNOSIS — Z170 Estrogen receptor positive status [ER+]: Secondary | ICD-10-CM

## 2018-08-27 NOTE — Progress Notes (Signed)
[place] the pt for follow-up regarding interest in counseling resources at [facility]. The pt expressed "I am doing better" and that she does not feel that she needs counseling at this time. The pt reported that she will call the counselor at [facility] if any needs arise.  Doris Cheadle, Counseling Intern [phone]

## 2018-09-19 ENCOUNTER — Encounter: Payer: Self-pay | Admitting: Hematology and Oncology

## 2018-09-19 ENCOUNTER — Other Ambulatory Visit: Payer: Self-pay | Admitting: Nurse Practitioner

## 2018-09-23 IMAGING — CR DG HUMERUS 2V *R*
2 series · 2 of 2 positions shown · non-contrast
Comparison: None.

CLINICAL DATA: 61-year-old female with right arm/hand numbness,
pain and tingling for several hours.

EXAM:
RIGHT HUMERUS - 2+ VIEW

[w humerus ap right]
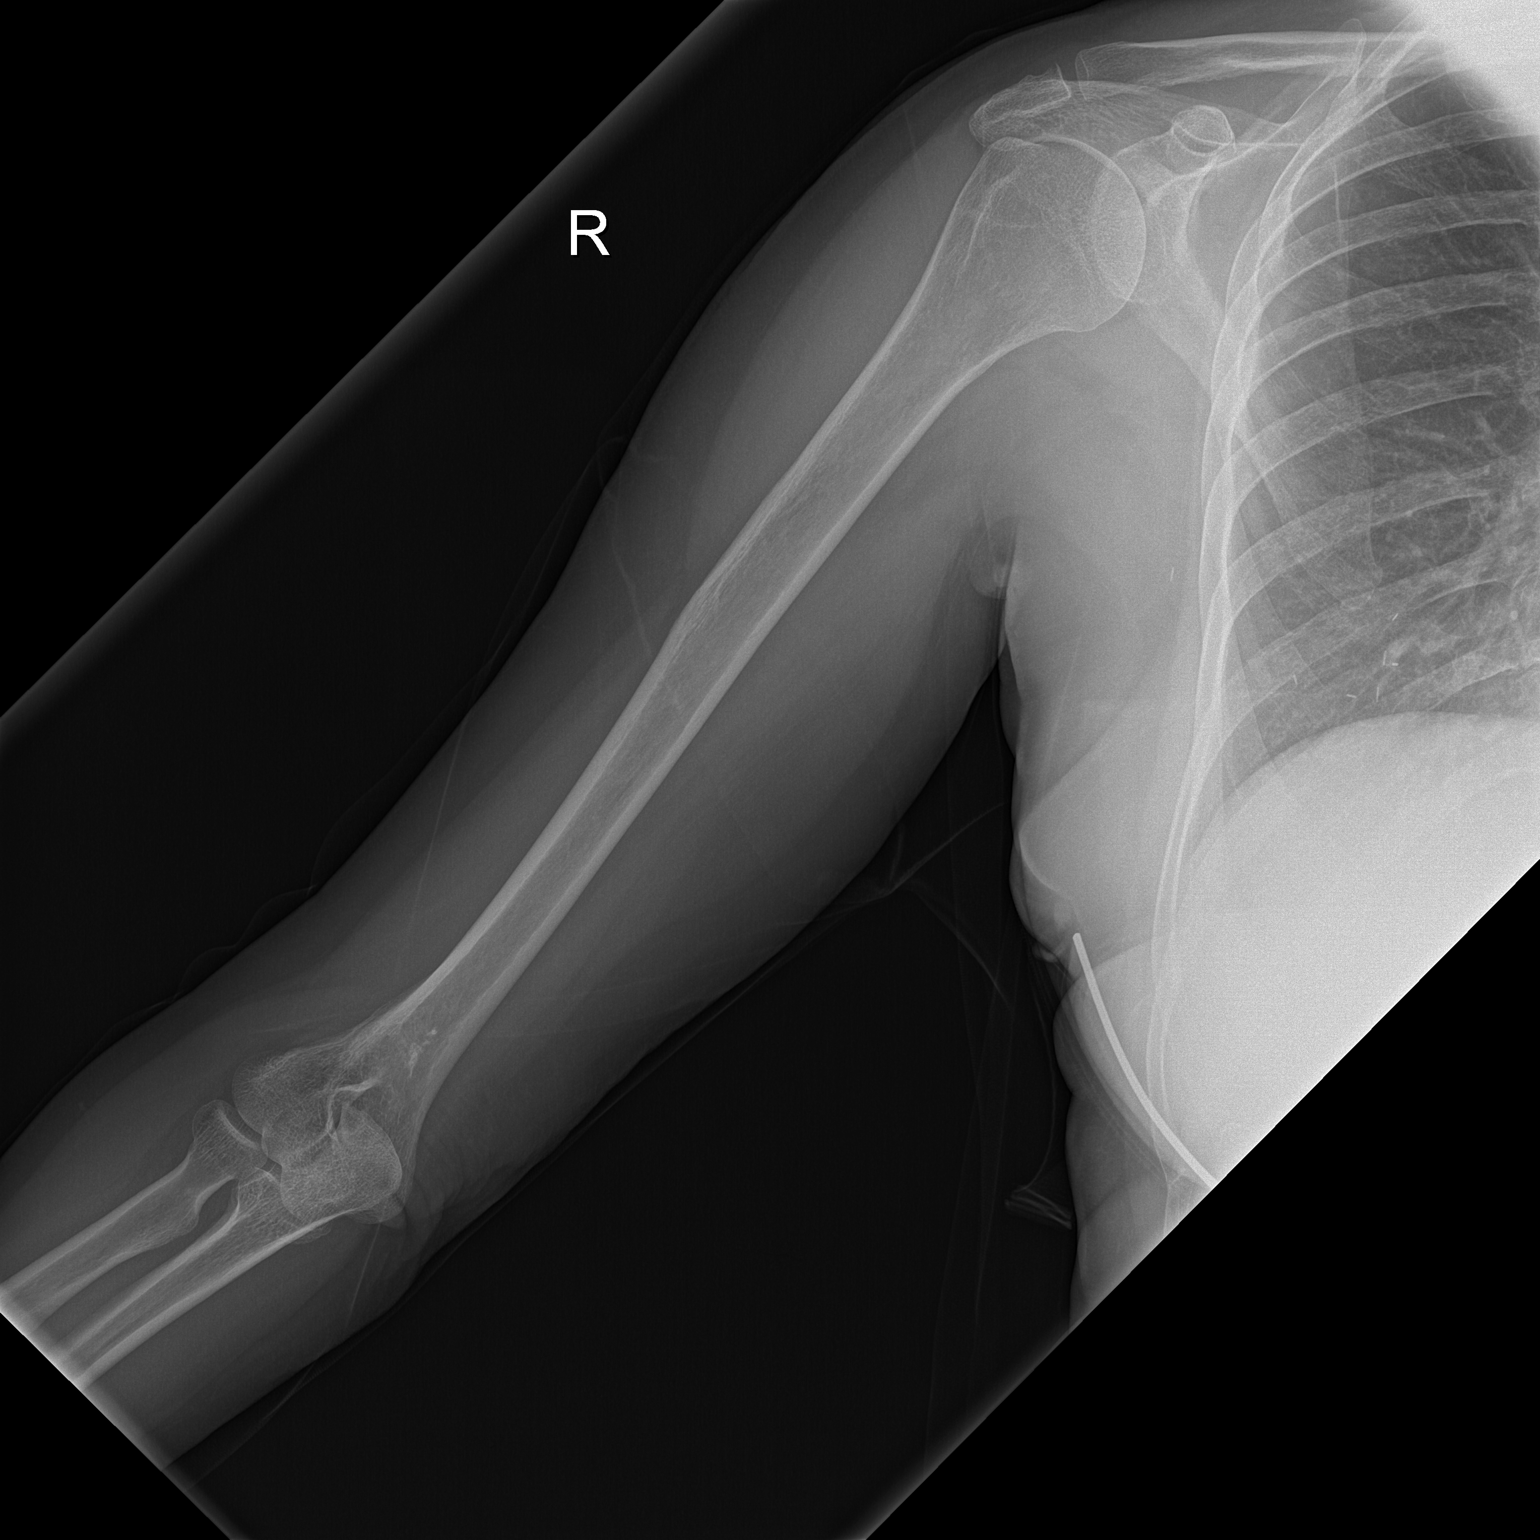

[w humerus lat right]
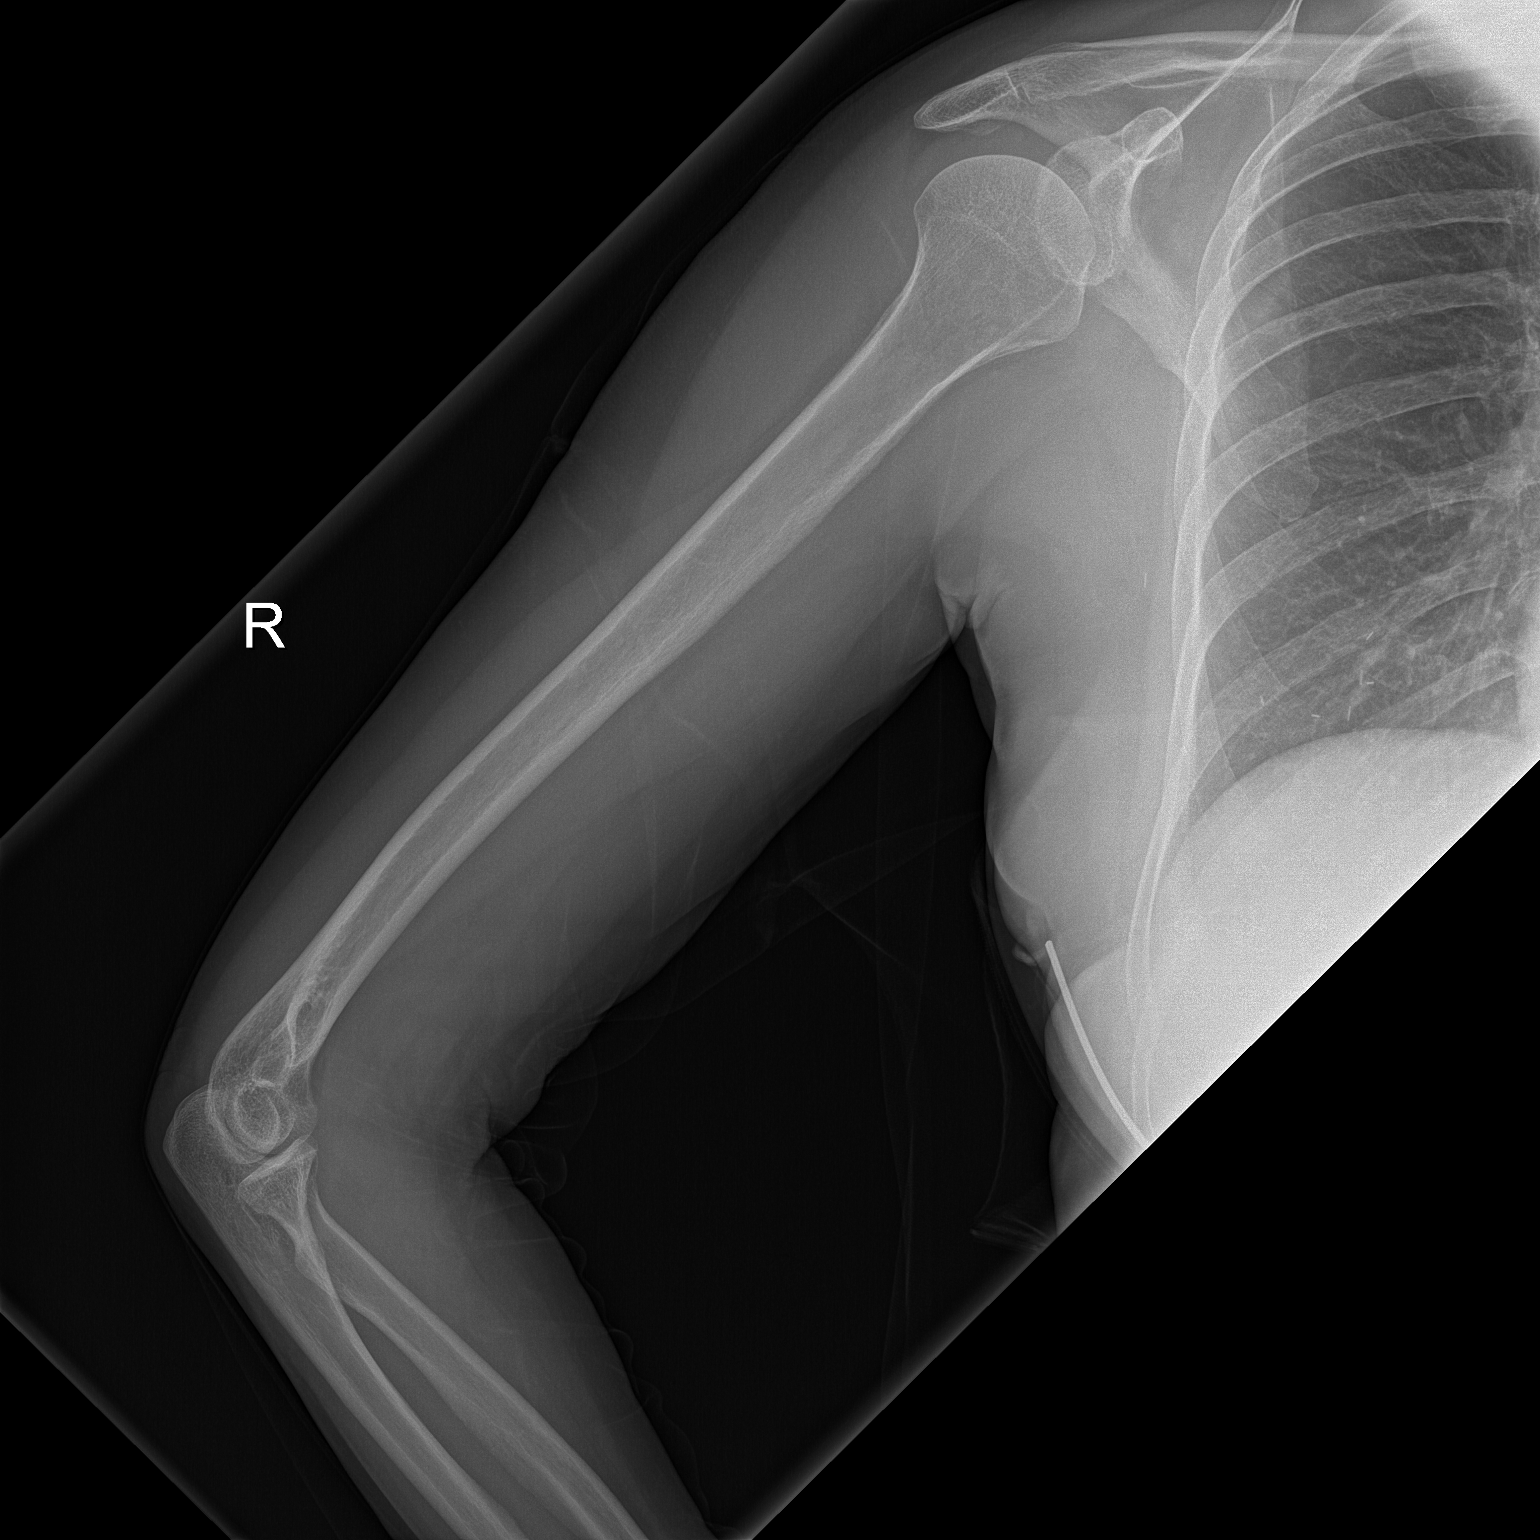

[2 of 2 positions shown; findings below may reference images not displayed]

FINDINGS: There is no evidence of fracture or other focal bone lesions. Soft
tissues are unremarkable.
IMPRESSION: Negative.

## 2018-11-12 ENCOUNTER — Other Ambulatory Visit: Payer: Self-pay | Admitting: Hematology and Oncology

## 2018-11-19 ENCOUNTER — Other Ambulatory Visit: Payer: Self-pay | Admitting: *Deleted

## 2018-11-19 MED ORDER — LETROZOLE 2.5 MG PO TABS: 2.5000 mg | ORAL_TABLET | Freq: Every day | ORAL | 3 refills | Status: DC

## 2018-11-27 ENCOUNTER — Telehealth: Payer: BLUE CROSS/BLUE SHIELD | Admitting: Physician Assistant

## 2018-11-27 DIAGNOSIS — Z20822 Contact with and (suspected) exposure to covid-19: Secondary | ICD-10-CM

## 2018-11-27 DIAGNOSIS — Z20828 Contact with and (suspected) exposure to other viral communicable diseases: Secondary | ICD-10-CM

## 2018-11-27 NOTE — Progress Notes (Signed)
E-Visit for Corona Virus Screening   Called patient to clarify.  She is not having any symptoms.  She is not having a cough but just answered yes so she could get some answers regarding exposure.  She works at a [employer] and they had a patient come in a few days ago who ended up in the hospital and tested positive for corona virus.  She was not within 6 feet of patient at any time.  Patient did not cough or sneeze while in the office.  She was also wearing a mask.  She is not currently having fever, shortness of breath, cough, or difficulty breathing.  The lower leg swelling that she reported is from chronic knee issues and not related to this.  I have provided her with information regarding coronavirus exposure.  Educated on quarantine and what to do if she develops any symptoms.  Patient thanked me for the call.  Based on your current symptoms, it seems unlikely that your symptoms are related to the Coronavirus.   Coronavirus disease 2019 (COVID-19) is a respiratory illness that can spread from person to person. The virus that causes COVID-19 is a new virus that was first identified in the country of [place] but is now found in multiple other countries and has spread to the [place].  Symptoms associated with the virus are mild to severe fever, cough, and shortness of breath. There is currently no vaccine to protect against COVID-19, and there is no specific antiviral treatment for the virus.   To be considered HIGH RISK for Coronavirus (COVID-19), you have to meet the following criteria:  . Traveled to [place], [place], [place], [place] or [place]; or in the [place] to [place], [place], [place], or [place]; and have fever, cough, and shortness of breath within the last 2 weeks of travel OR  . Been in close contact with a person diagnosed with COVID-19 within the last 2 weeks and have fever, cough, and shortness of breath  . IF YOU DO NOT MEET THESE CRITERIA, YOU ARE  CONSIDERED LOW RISK FOR COVID-19.   It is vitally important that if you feel that you have an infection such as this virus or any other virus that you stay home and away from places where you may spread it to others.  You should self-quarantine for 14 days if you have symptoms that could potentially be coronavirus and avoid contact with people age 63 and older.    Reduce your risk of any infection by using the same precautions used for avoiding the common cold or flu:  Marland Kitchen Wash your hands often with soap and warm water for at least 20 seconds.  If soap and water are not readily available, use an alcohol-based hand sanitizer with at least 60% alcohol.  . If coughing or sneezing, cover your mouth and nose by coughing or sneezing into the elbow areas of your shirt or coat, into a tissue or into your sleeve (not your hands). . Avoid shaking hands with others and consider head nods or verbal greetings only. . Avoid touching your eyes, nose, or mouth with unwashed hands.  . Avoid close contact with people who are sick. . Avoid places or events with large numbers of people in one location, like concerts or sporting events. . Carefully consider travel plans you have or are making. . If you are planning any travel outside or inside the [place], visit the CDC's Travelers' Health webpage for the latest health  notices. . If you have some symptoms but not all symptoms, continue to monitor at home and seek medical attention if your symptoms worsen. . If you are having a medical emergency, call 911.  HOME CARE . Only take medications as instructed by your medical team. . Drink plenty of fluids and get plenty of rest. . A steam or ultrasonic humidifier can help if you have congestion.   GET HELP RIGHT AWAY IF: . You develop worsening fever. . You become short of breath . You cough up blood. . Your symptoms become more severe MAKE SURE YOU   Understand these instructions.  Will watch your condition.  Will  get help right away if you are not doing well or get worse.  Your e-visit answers were reviewed by a board certified advanced clinical practitioner to complete your personal care plan.  Depending on the condition, your plan could have included both over the counter or prescription medications.  If there is a problem please reply once you have received a response from your provider. Your safety is important to [place].  If you have drug allergies check your prescription carefully.    You can use MyChart to ask questions about today's visit, request a non-urgent call back, or ask for a work or school excuse for 24 hours related to this e-Visit. If it has been greater than 24 hours you will need to follow up with your provider, or enter a new e-Visit to address those concerns. You will get an e-mail in the next two days asking about your experience.  I hope that your e-visit has been valuable and will speed your recovery. Thank you for using e-visits.

## 2018-11-27 NOTE — Progress Notes (Signed)
Based on what you shared with me, I feel your condition warrants further evaluation and I recommend that you be seen for a face to face office visit.     NOTE: If you entered your credit card information for this eVisit, you will not be charged. You may see a "hold" on your card for the $35 but that hold will drop off and you will not have a charge processed.  If you are having a true medical emergency please call 911.   PLEASE NOTE: THE [facility] LOCATIONS AND URGENT CARE CLINICS DO NOT HAVE THE TESTING FOR CORONAVIRUS COVID19 AVAILABLE.  IF YOU FEEL YOU NEED THIS TEST YOU MUST GO TO A TRIAGE LOCATION AT [place]   . [facility] [place] a Provider at this Location  [address] [place], [place] [address] . 10 am to 8 pm Monday-Friday . 12 pm to 8 pm Saturday-Sunday   . [facility] at [place] a Provider at this Location  [address] [place], [place] [address] . 8 am to 8 pm Monday-Friday . 9 am to 6 pm Saturday . 11 am to 6 pm Sunday   . [facility] at [place] Get Driving Directions  [address] [place], [place] [address] . 8 am to 8 pm Monday-Friday . 8 am to 4 pm Saturday-Sunday   Your e-visit answers were reviewed by a board certified advanced clinical practitioner to complete your personal care plan.  Thank you for using e-Visits.

## 2018-12-06 IMAGING — DX DG LUMBAR SPINE 2-3V
3 series · 3 of 3 positions shown · non-contrast
Comparison: None.

CLINICAL DATA: Back pain for 3 week

EXAM:
LUMBAR SPINE - 2-3 VIEW

[l-spine ap]
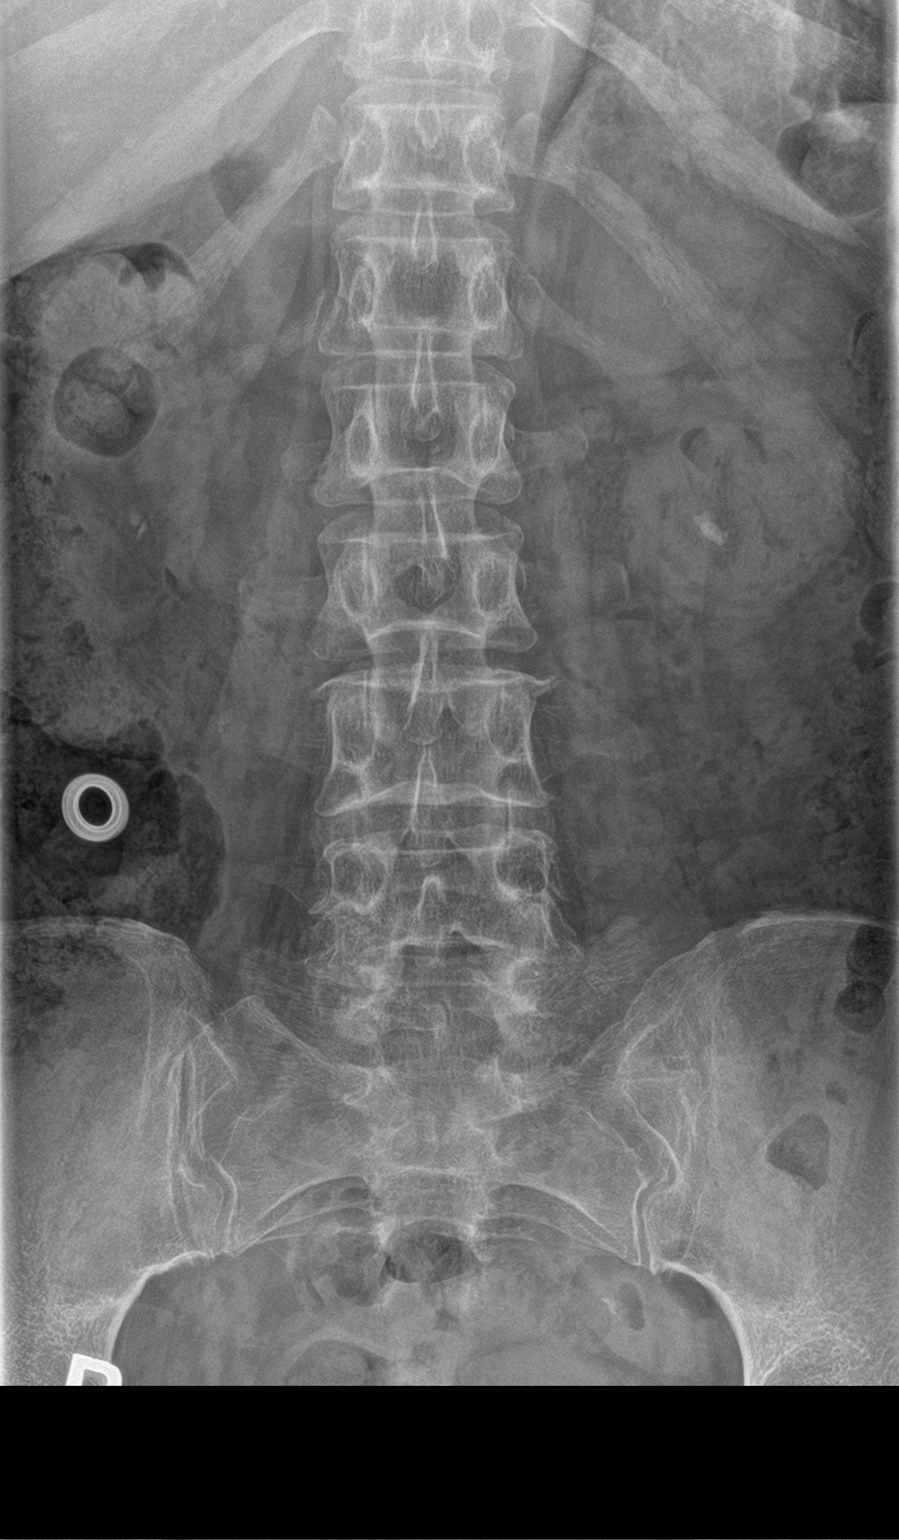

[l-spine lat]
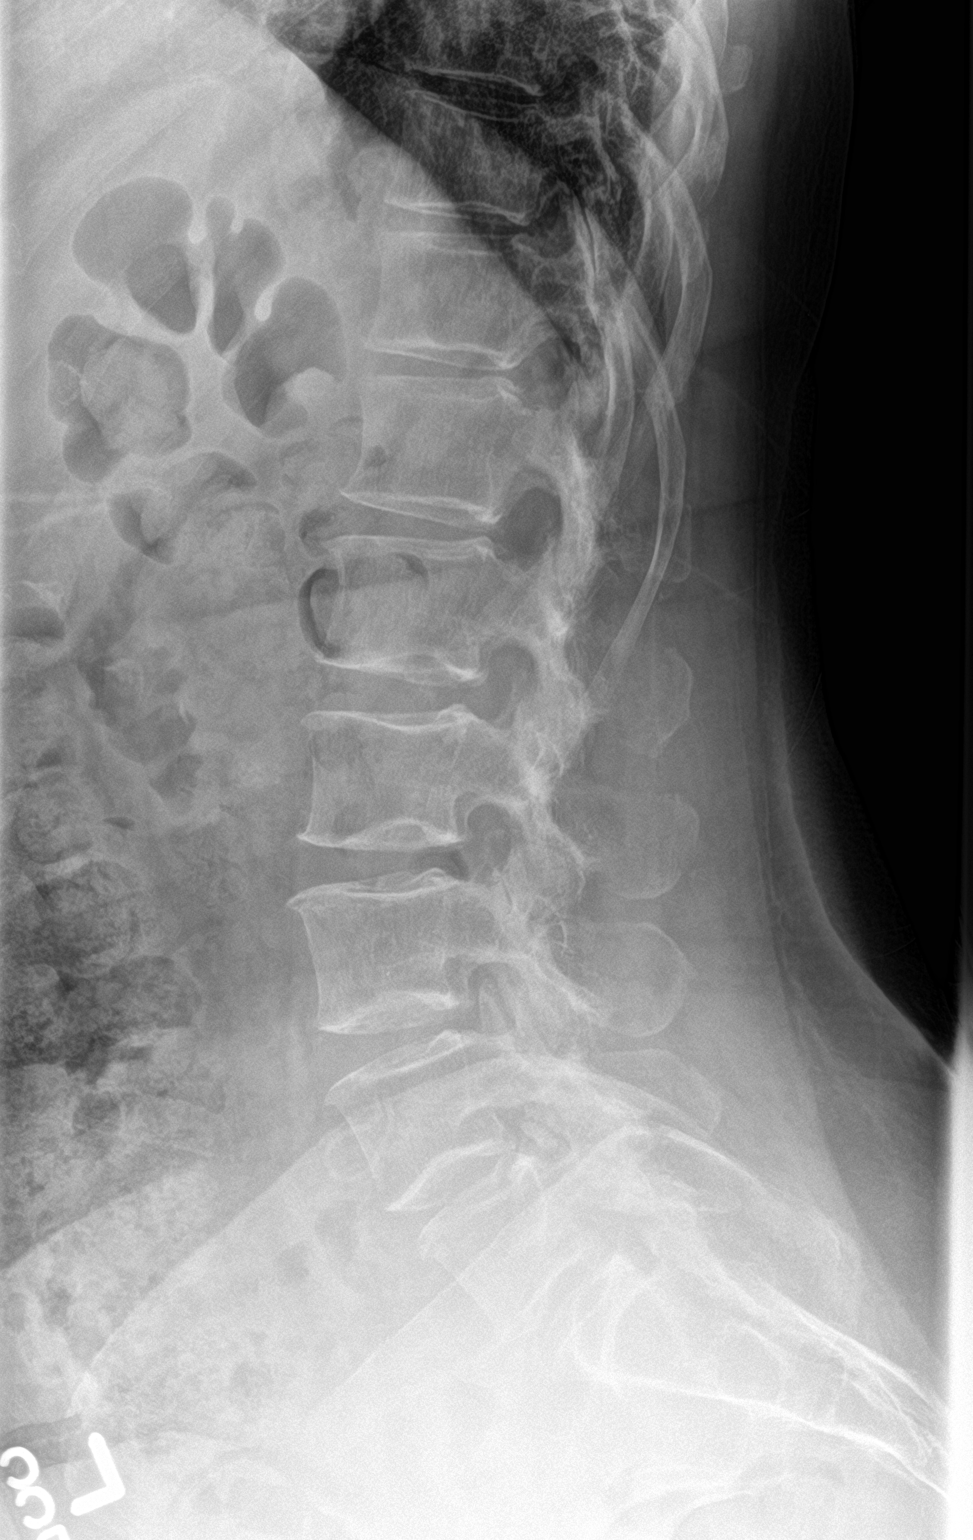

[l-spine spot]
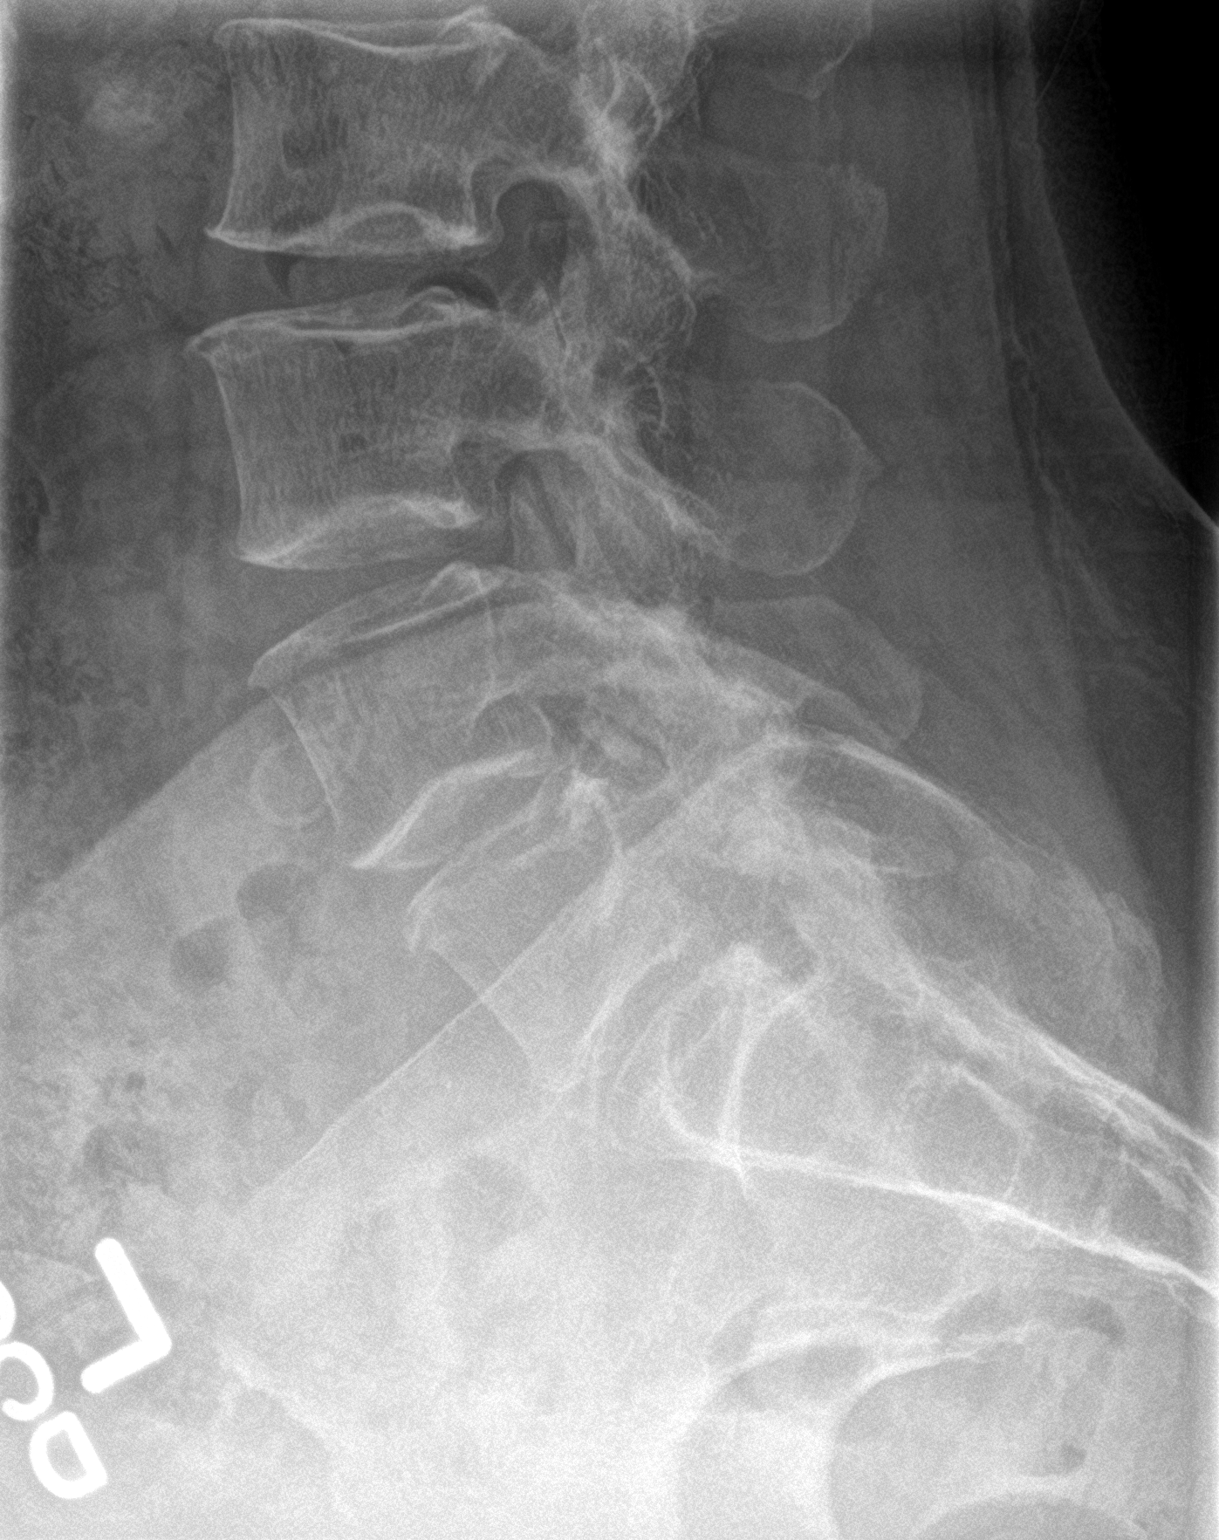

[3 of 3 positions shown; findings below may reference images not displayed]

FINDINGS: There is no evidence of lumbar spine fracture. Alignment is normal.
There are minimal degenerative joint changes with minimal decreased
intervertebral space and anterior osteophytosis in the lower lumbar
spine. Extensive bowel content is identified throughout visualized
colon. There are probably nonobstructing stone in lower pole of
bilateral kidneys.
IMPRESSION: Minimal degenerative joint changes of spine. No acute fracture or
dislocation is identified.

Marked bowel content identified in the colon.

Nonobstructing stones in lower pole of both kidneys.

## 2018-12-06 IMAGING — DX DG THORACIC SPINE 2V
3 series · 3 of 3 positions shown · non-contrast
Comparison: None.

CLINICAL DATA: Back pain for 3 weeks.

EXAM:
THORACIC SPINE 2 VIEWS

[t-spine ap]
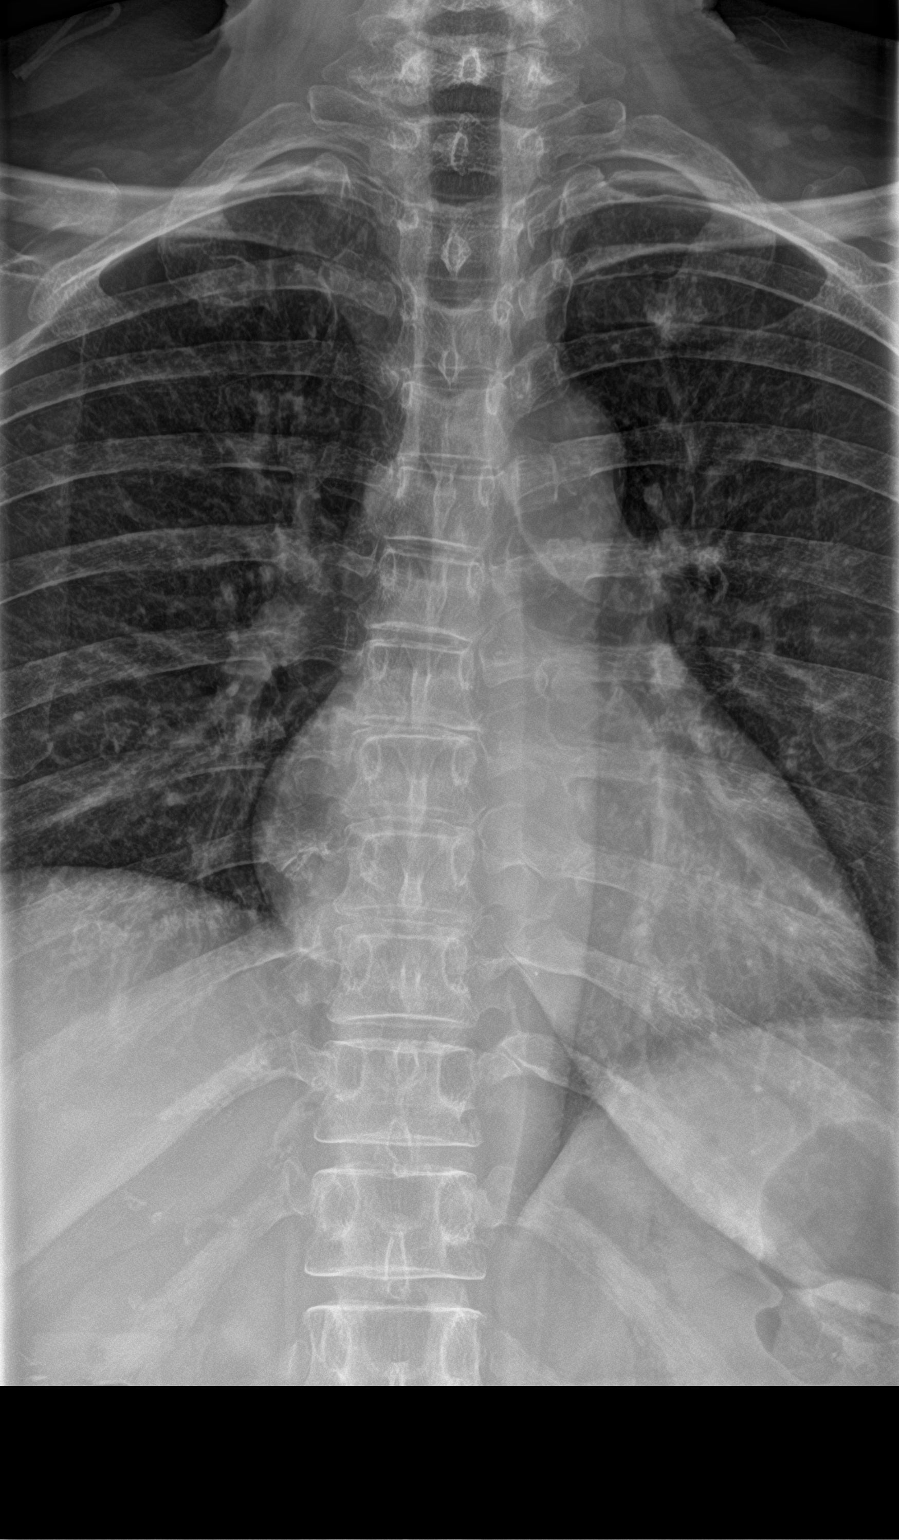

[t-spine lat]
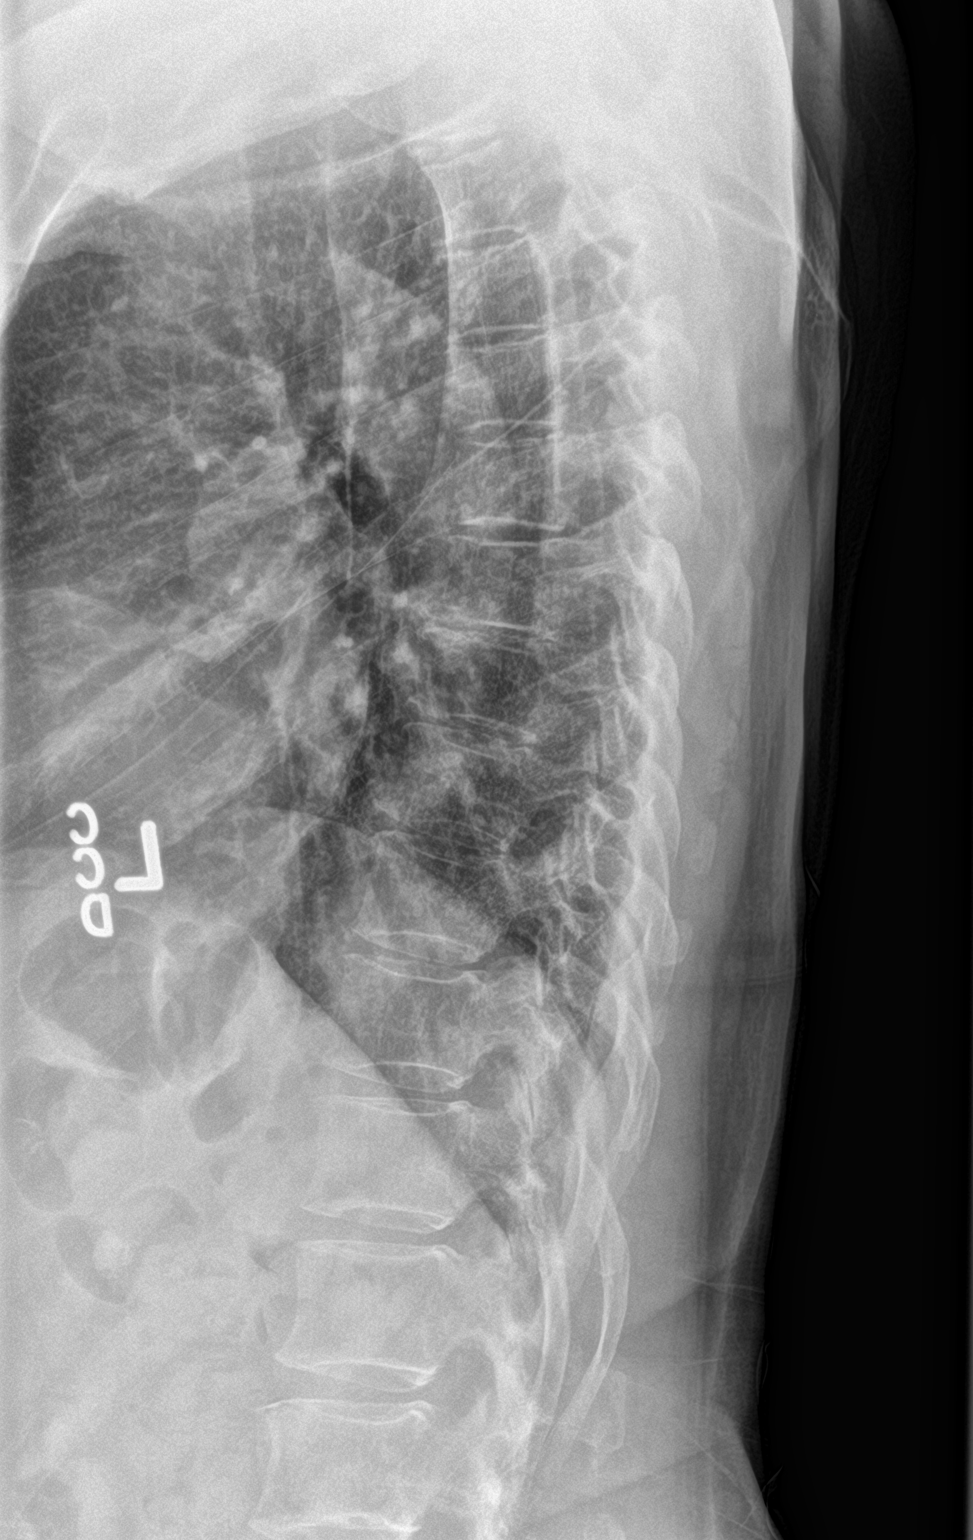

[t-spine swimmers]
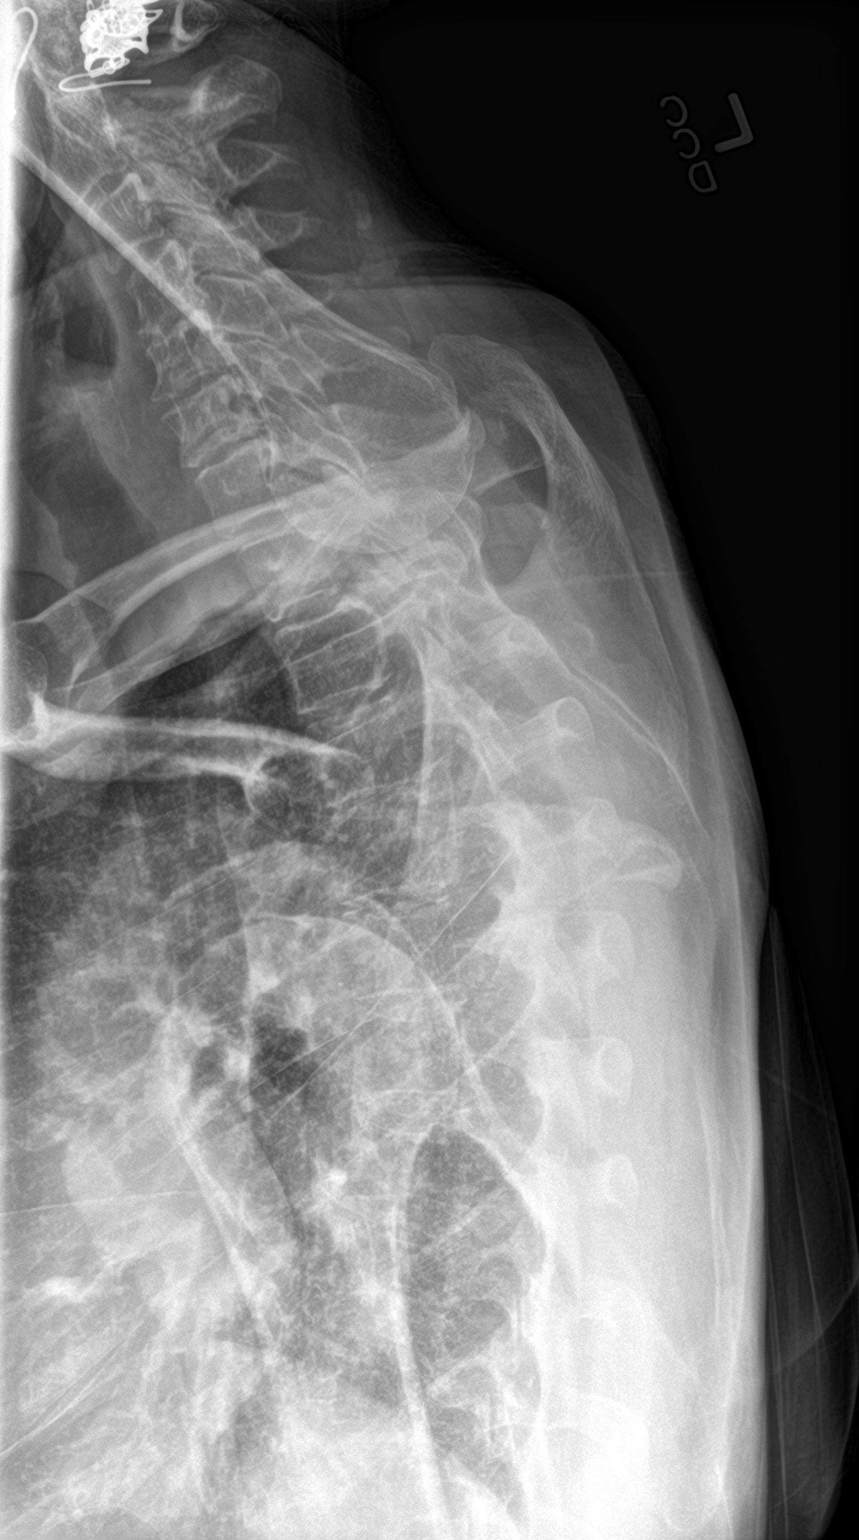

[3 of 3 positions shown; findings below may reference images not displayed]

FINDINGS: There is no evidence of thoracic spine fracture. There is mild
scoliosis of spine. No other significant bone abnormalities are
identified.
IMPRESSION: No acute fracture or dislocation identified. Mild scoliosis of
spine.

## 2019-01-29 ENCOUNTER — Ambulatory Visit: Payer: BLUE CROSS/BLUE SHIELD | Admitting: Hematology and Oncology

## 2019-04-09 ENCOUNTER — Encounter: Payer: Self-pay | Admitting: Physician Assistant

## 2019-05-04 ENCOUNTER — Encounter: Payer: Self-pay | Admitting: Adult Health

## 2019-05-31 ENCOUNTER — Encounter: Payer: Self-pay | Admitting: Adult Health Nurse Practitioner

## 2019-05-31 NOTE — Progress Notes (Signed)
NO SHOW

## 2019-06-01 ENCOUNTER — Ambulatory Visit (INDEPENDENT_AMBULATORY_CARE_PROVIDER_SITE_OTHER): Payer: BLUE CROSS/BLUE SHIELD | Admitting: Adult Health Nurse Practitioner

## 2019-06-01 ENCOUNTER — Encounter: Payer: Self-pay | Admitting: Physician Assistant

## 2019-06-01 DIAGNOSIS — Z5329 Procedure and treatment not carried out because of patient's decision for other reasons: Secondary | ICD-10-CM

## 2019-09-20 ENCOUNTER — Telehealth: Payer: Self-pay | Admitting: Hematology and Oncology

## 2019-09-20 NOTE — Telephone Encounter (Signed)
Scheduled per 1/22 sch msg. Called and left msg. Mailing printout

## 2019-09-28 ENCOUNTER — Telehealth: Payer: Self-pay | Admitting: Hematology and Oncology

## 2019-09-28 NOTE — Telephone Encounter (Signed)
Returned patient's phone call regarding rescheduling April appointment, per patient's request appointment has moved to July.

## 2019-09-30 ENCOUNTER — Other Ambulatory Visit: Payer: Self-pay

## 2019-09-30 ENCOUNTER — Encounter: Payer: Self-pay | Admitting: Physician Assistant

## 2019-09-30 ENCOUNTER — Ambulatory Visit (INDEPENDENT_AMBULATORY_CARE_PROVIDER_SITE_OTHER): Payer: No Typology Code available for payment source | Admitting: Physician Assistant

## 2019-09-30 ENCOUNTER — Ambulatory Visit
Admission: RE | Admit: 2019-09-30 | Discharge: 2019-09-30 | Disposition: A | Discharge status: Home / Self Care | Payer: BLUE CROSS/BLUE SHIELD | Source: Ambulatory Visit | Attending: Physician Assistant | Admitting: Physician Assistant

## 2019-09-30 VITALS — BP 118/66 | HR 67 | Temp 97.2°F | Ht 66.5 in | Wt 170.0 lb

## 2019-09-30 DIAGNOSIS — R0609 Other forms of dyspnea: Secondary | ICD-10-CM

## 2019-09-30 DIAGNOSIS — Z136 Encounter for screening for cardiovascular disorders: Secondary | ICD-10-CM | POA: Diagnosis not present

## 2019-09-30 DIAGNOSIS — Z170 Estrogen receptor positive status [ER+]: Secondary | ICD-10-CM

## 2019-09-30 DIAGNOSIS — R0600 Dyspnea, unspecified: Secondary | ICD-10-CM

## 2019-09-30 DIAGNOSIS — Z0000 Encounter for general adult medical examination without abnormal findings: Secondary | ICD-10-CM | POA: Diagnosis not present

## 2019-09-30 DIAGNOSIS — C50919 Malignant neoplasm of unspecified site of unspecified female breast: Secondary | ICD-10-CM

## 2019-09-30 DIAGNOSIS — G4700 Insomnia, unspecified: Secondary | ICD-10-CM

## 2019-09-30 DIAGNOSIS — E785 Hyperlipidemia, unspecified: Secondary | ICD-10-CM

## 2019-09-30 DIAGNOSIS — C50411 Malignant neoplasm of upper-outer quadrant of right female breast: Secondary | ICD-10-CM

## 2019-09-30 DIAGNOSIS — Z803 Family history of malignant neoplasm of breast: Secondary | ICD-10-CM

## 2019-09-30 DIAGNOSIS — Z808 Family history of malignant neoplasm of other organs or systems: Secondary | ICD-10-CM

## 2019-09-30 DIAGNOSIS — Z79899 Other long term (current) drug therapy: Secondary | ICD-10-CM

## 2019-09-30 DIAGNOSIS — Z1379 Encounter for other screening for genetic and chromosomal anomalies: Secondary | ICD-10-CM

## 2019-09-30 DIAGNOSIS — Z1502 Genetic susceptibility to malignant neoplasm of ovary: Secondary | ICD-10-CM

## 2019-09-30 DIAGNOSIS — E559 Vitamin D deficiency, unspecified: Secondary | ICD-10-CM

## 2019-09-30 DIAGNOSIS — Z1389 Encounter for screening for other disorder: Secondary | ICD-10-CM

## 2019-09-30 DIAGNOSIS — Z130 Encounter for screening for diseases of the blood and blood-forming organs and certain disorders involving the immune mechanism: Secondary | ICD-10-CM

## 2019-09-30 DIAGNOSIS — Z0001 Encounter for general adult medical examination with abnormal findings: Secondary | ICD-10-CM

## 2019-09-30 DIAGNOSIS — R7309 Other abnormal glucose: Secondary | ICD-10-CM

## 2019-09-30 DIAGNOSIS — G43809 Other migraine, not intractable, without status migrainosus: Secondary | ICD-10-CM

## 2019-09-30 MED ORDER — TRAZODONE HCL 50 MG PO TABS: ORAL_TABLET | ORAL | 2 refills | Status: DC

## 2019-09-30 MED ORDER — ALBUTEROL SULFATE HFA 108 (90 BASE) MCG/ACT IN AERS: 2.0000 | INHALATION_SPRAY | RESPIRATORY_TRACT | 1 refills | Status: DC | PRN

## 2019-09-30 MED ORDER — ESZOPICLONE 3 MG PO TABS: 3.0000 mg | ORAL_TABLET | Freq: Every evening | ORAL | 0 refills | Status: DC | PRN

## 2019-09-30 NOTE — Patient Instructions (Addendum)
Get shingrix vaccine from pharamcy in 9-12 months from shingles outbreak.   Your LDL could improve, ideally we want it under a 100.  Your LDL is the bad cholesterol that can lead to heart attack and stroke. To lower your number you can decrease your fatty foods, red meat, cheese, milk and increase fiber like whole grains and veggies. You can also add a fiber supplement like Citracel or Benefiber, these do not cause gas and bloating and are safe to use. Especially if you have a strong family history of heart disease or stroke or you have evidence of plaque on any imaging like a chest xray, we may discuss at your next office visit putting you on a medication to get your number below 100.   PAPA and BARKLEY CBD OIL  Will send in trazadone to try And will send in lunesta to try rather than the lorazepam  TRAZODONE  Try the trazodone This is not habit forming, you will not build a tolerance to it Please start out at 25mg  or 1/2 pill, can increase to 50mg  at night, and the max of this medication is 150mg  at night.   Side effects can be odd dreams, dry mouth, and drowsiness.   INFORMATION ABOUT YOUR XRAY  Can walk into [address] building for an Insurance account manager. They will have the order and take you back. You do not any paper work, I should get the result back today or tomorrow. This order is good for a year.  Can call [phone] to schedule an appointment if you wish.   Check out  [email or web address]    Shortness of Breath, Adult Shortness of breath is when a person has trouble breathing enough air or when a person feels like she or he is having trouble breathing in enough air. Shortness of breath could be a sign of a medical problem. Follow these instructions at home:   Pay attention to any changes in your symptoms.  Do not use any products that contain nicotine or tobacco, such as cigarettes, e-cigarettes, and chewing tobacco.  Do not smoke. Smoking  is a common cause of shortness of breath. If you need help quitting, ask your health care provider.  Avoid things that can irritate your airways, such as: ? Mold. ? Dust. ? Air pollution. ? Chemical fumes. ? Things that can cause allergy symptoms (allergens), if you have allergies.  Keep your living space clean and free of mold and dust.  Rest as needed. Slowly return to your usual activities.  Take over-the-counter and prescription medicines only as told by your health care provider. This includes oxygen therapy and inhaled medicines.  Keep all follow-up visits as told by your health care provider. This is important. Contact a health care provider if:  Your condition does not improve as soon as expected.  You have a hard time doing your normal activities, even after you rest.  You have new symptoms. Get help right away if:  Your shortness of breath gets worse.  You have shortness of breath when you are resting.  You feel light-headed or you faint.  You have a cough that is not controlled with medicines.  You cough up blood.  You have pain with breathing.  You have pain in your chest, arms, shoulders, or abdomen.  You have a fever.  You cannot walk up stairs or exercise the way that you normally do. These symptoms may represent a serious problem that is an emergency. Do not wait to see if  the symptoms will go away. Get medical help right away. Call your local emergency services (911 in the [place] Do not drive yourself to the hospital. Summary  Shortness of breath is when a person has trouble breathing enough air. It can be a sign of a medical problem.  Avoid things that irritate your lungs, such as smoking, pollution, mold, and dust.  Pay attention to changes in your symptoms and contact your health care provider if you have a hard time completing daily activities because of shortness of breath. This information is not intended to replace advice given to you by your  health care provider. Make sure you discuss any questions you have with your health care provider. Document Revised: 01/12/2018 Document Reviewed: 01/12/2018 Elsevier Patient Education  [employer]  Being a woman you may not have the typical symptoms of a heart attack.  You may not have any pain OR you may have atypical pain such as jaw pain, upper back pain, arm pain, "my bra feels to tight" and you will often have symptoms with it like below.  Symptoms for a heart attack will likely occur when you exert your self or exercise and include: Shortness of breath Sweating Nausea Dizziness Fast or irregular heart beats Fatigue   It makes me feel better if my patients get their heart rate up with exercise once or twice a week and pay close attention to your body. If there is ANY change in your exercise capacity or if you have symptoms above, please STOP and call 911 or call to come to the office.   Here is some information to help you keep your heart healthy: Move it! - Aim for 30 mins of activity every day. Take it slowly at first. Talk to [place] before starting any new exercise program.   Lose it.  -Body Mass Index (BMI) can indicate if you need to lose weight. A healthy range is 18.5-24.9. For a BMI calculator, go to [employer].com  Waist Management -Excess abdominal fat is a risk factor for heart disease, diabetes, asthma, stroke and more. Ideal waist circumference is less than 35" for women and less than 40" for men.   Eat Right -focus on fruits, vegetables, whole grains, and meals you make yourself. Avoid foods with trans fat and high sugar/sodium content.   Snooze or Snore? - Loud snoring can be a sign of sleep apnea, a significant risk factor for high blood pressure, heart attach, stroke, and heart arrhythmias.  Kick the habit -Quit Smoking! Avoid second hand smoke. A single cigarette raises your blood pressure for 20 mins and increases the risk of  heart attack and stroke for the next 24 hours.   Are Aspirin and Supplements right for you? -Add ENTERIC COATED low dose 81 mg Aspirin daily OR can do every other day if you have easy bruising to protect your heart and head. As well as to reduce risk of Colon Cancer by 20 %, Skin Cancer by 26 % , Melanoma by 46% and Pancreatic cancer by 60%  Say "No to Stress -There may be little you can do about problems that cause stress. However, techniques such as long walks, meditation, and exercise can help you manage it.   Start Now! - Make changes one at a time and set reasonable goals to increase your likelihood of success.

## 2019-09-30 NOTE — Progress Notes (Signed)
Patient ID: Rebecca [name], female   DOB: [birth year], 64 y.o.   MRN: [id number]  Complete Physical  Assessment and Plan:  Encounter for general adult medical examination with abnormal findings 1 year  Hyperlipidemia, unspecified hyperlipidemia type -     Lipid panel check lipids decrease fatty foods increase activity.   Vitamin D deficiency -     VITAMIN D 25 Hydroxy (Vit-D Deficiency, Fractures)  Family history of melanoma Monitor skin, no issues at this time.   Other migraine without status migrainosus, not intractable Monitor  Genetic testing Follow up as needed  CHEK2-related breast cancer (HCC) Monitor  Screening, anemia, deficiency, iron -     Iron,Total/Total Iron Binding Cap -     Vitamin B12  Carcinoma of upper-outer quadrant of right breast in female, estrogen receptor positive ([place]) S/p chemo radiation, going to start following with Dr. Lindi Adie again  Medication management -     Magnesium  Insomnia, unspecified type -     traZODone (DESYREL) 50 MG tablet; 1/2-1 tablet for sleep -     Eszopiclone (ESZOPICLONE) 3 MG TABS; Take 1 tablet (3 mg total) by mouth at bedtime as needed. Take immediately before bedtime -     TSH - rarely takes lorazepam, will try trazodone and lunesta to replace it, patient knows to not take together  DOE (dyspnea on exertion) Patient is up 10 lbs in weight, discussed that his can be from deconditioning/weight gain however with wheezing and radiation/cancer history will get CXR, EKG WNL no ST changes.  Discussed cardio referral, without CP, other symptoms, will monitor for now Go to the ER if any chest pain, shortness of breath, nausea, dizziness, severe HA, changes vision/speech -     EKG 12-Lead -     DG Chest 2 View; Future -     albuterol (VENTOLIN HFA) 108 (90 Base) MCG/ACT inhaler; Inhale 2 puffs into the lungs every 4 (four) hours as needed for wheezing or shortness of breath.  Abnormal glucose -     Hemoglobin  A1c 'Discussed disease progression and risks Discussed diet/exercise, weight management and risk modification  Screening for hematuria or proteinuria -     Urinalysis, Routine w reflex microscopic -     Microalbumin / creatinine urine ratio     Discussed med's effects and SE's. Screening labs and tests as requested with regular follow-up as recommended. Future Appointments  Date Time Provider [place]  03/03/2020 10:30 AM Nicholas Lose, MD [facility] None  10/02/2020  2:00 PM Vicie Mutters, PA-C [facility] None    HPI  64 y.o. female  presents for a complete physical.  Her blood pressure has been controlled at home, today their BP is BP: 118/66.  She does workout. She denies chest pain,  dizziness.   She had a Right lumpectomy on 03/19/2017 with Dr. Excell Seltzer: IDC with DCIS, 1.2 cm, grade 2, margins negative, 0/2 lymph nodes negative, ER 100%, PR 10%, HER-2 negative ratio 1.73, Ki-67 40% T1BN0 stage IA and has been getting systemic chemo, finished Jan 2019, going to follow back up with [facility], Dr. Lindi Adie.  She states she has DOE with hills or with stairs for 5-6 months. No coughing or wheezing. She denies chest pain, leg swelling, dizziness, sweating. No CXR, echo 2018 EF 65%.  History of smoking 15 years ago, smoked 25 years just on weekends.   Had shingles again, this is the 4th time, has not had shingles vaccine.  BMI is Body mass index is 27.03  kg/m., she is working on diet and exercise. Wt Readings from Last 3 Encounters:  09/30/19 170 lb (77.1 kg)  08/14/18 160 lb 9.6 oz (72.8 kg)  04/29/18 164 lb 6.4 oz (74.6 kg)   She is on ativan PRN for sleep, takes every 2 weeks but does not help. Can not tolerate gabapentin. States she needs 3-4 hours of sleep a night and feels rested with that.   She is on cholesterol medication, not fasting and denies myalgias. Her cholesterol is not at goal. The cholesterol last visit was:  Lab Results  Component Value Date    CHOL 243 (H) 04/29/2018   HDL 67 04/29/2018   LDLCALC 138 (H) 04/29/2018   TRIG 236 (H) 04/29/2018   CHOLHDL 3.6 04/29/2018  . She has been working on diet and exercise for prediabetes,  and denies foot ulcerations, hyperglycemia, hypoglycemia , increased appetite, nausea, paresthesia of the feet, polydipsia, polyuria, visual disturbances, vomiting and weight loss. Last A1C in the office was:  Lab Results  Component Value Date   HGBA1C 5.5 03/20/2016   Patient is on Vitamin D supplement, she can not tolerate Vitamin D supplementation, has tried all forms but they give her a headache.  Lab Results  Component Value Date   VD25OH 27 (L) 04/29/2018       Current Medications:  Current Outpatient Medications on File Prior to Visit  Medication Sig Dispense Refill  . ibuprofen (ADVIL,MOTRIN) 200 MG tablet Take 200 mg by mouth every 6 (six) hours as needed for mild pain.    Marland Kitchen letrozole (FEMARA) 2.5 MG tablet Take 1 tablet (2.5 mg total) by mouth daily. 90 tablet 3  . LORazepam (ATIVAN) 0.5 MG tablet Take 1 tablet (0.5 mg total) by mouth at bedtime as needed. 30 tablet 1  . Biotin 1 MG CAPS Take by mouth.     No current facility-administered medications on file prior to visit.    Health Maintenance:   Immunization History  Administered Date(s) Administered  . Influenza Inj Mdck Quad With Preservative 06/26/2018  . Influenza,inj,Quad PF,6+ Mos 06/25/2017  . Td 08/27/2007  . Tdap 03/16/2018   Tetanus: 2019 Flu vaccine:2018 Pap: July 2018 [facility]  01/2018  DEXA: Dr. Ophelia Charter 2019 AT [facility] Colonoscopy: 2019, [place] Last Eye Exam: Eye doctor Echo 2018  Patient Care Team: [name], MD as PCP - General (Internal Medicine) [name], MD (Internal Medicine) Arta Silence, MD as Consulting Physician (Gastroenterology) Arvella Nigh, MD as Consulting Physician (Obstetrics and Gynecology) Nicholas Lose, MD as Consulting Physician (Hematology and Oncology) Eppie Gibson, MD  as Attending Physician (Radiation Oncology) Excell Seltzer, MD (Inactive) as Consulting Physician (General Surgery)  Medical History:  Past Medical History:  Diagnosis Date  . Depression    REMISSION/ SITUATIONAL  . Dyspnea   . Family history of brain cancer   . Family history of breast cancer   . Family history of colon cancer   . Family history of melanoma   . Hepatitis AGE 75   RESOLVED  . History of radiation therapy 09/24/17- 10/15/17   Right Breast treated to 42.56 Gy with 16 fx of 2.66 Gy  . Hyperlipidemia   . Insomnia   . Migraines    HORMONAL  . Personal history of chemotherapy   . Personal history of radiation therapy   . right breast ca dx' d 02/2017  . Shingles outbreak 2003, 2015   RIGHT HIP, and between breasts 3 years ago    Allergies Allergies  Allergen Reactions  .  Cholecalciferol Other (See Comments)    Headache  . Citalopram Other (See Comments)    HYPERACTIVITY  . Citalopram     Hyper  . Dexamethasone Other (See Comments)    CHEST TIGHTNESS  . Dexamethasone     Chest tightness  . Dilaudid [Hydromorphone Hcl] Nausea And Vomiting  . Propoxycaine Nausea And Vomiting  . Red Yeast Rice [Cholestin] Other (See Comments)    headache  . Rofecoxib Nausea And Vomiting  . Sulfamethoxazole Swelling    SURGICAL HISTORY She  has a past surgical history that includes Knee arthroscopy (Right, 2002); Tubal ligation; uterine polyps (2016); Breast lumpectomy with radioactive seed and sentinel lymph node biopsy (Right, 03/19/2017); and Breast lumpectomy (Right, 02/2017). FAMILY HISTORY Her family history includes Breast cancer in her sister; Cancer in her brother and paternal grandfather; Cancer (age of onset: 57) in her sister; Cancer (age of onset: 47) in her father; Cervical cancer in her maternal aunt; Dementia in her maternal aunt and mother; Depression in her brother; Heart disease in her maternal grandfather; Lung cancer in her maternal uncle; Lung cancer  (age of onset: 43) in her brother; Other in her paternal grandmother; Stroke (age of onset: 62) in her maternal grandmother. SOCIAL HISTORY She  reports that she quit smoking about 11 years ago. She has never used smokeless tobacco. She reports that she does not drink alcohol or use drugs. 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    Review of Systems: Review of Systems  Constitutional: Negative for chills, fever and malaise/fatigue.  HENT: Negative for congestion, ear pain and sore throat.   Eyes: Negative.   Respiratory: Positive for shortness of breath. Negative for cough, hemoptysis, sputum production and wheezing.   Cardiovascular: Negative for chest pain, palpitations, orthopnea, claudication, leg swelling and PND.  Gastrointestinal: Negative for abdominal pain, blood in stool, constipation, diarrhea, heartburn and melena.  Genitourinary: Negative.   Skin: Negative.   Neurological: Negative for dizziness, sensory change, loss of consciousness and headaches.  Psychiatric/Behavioral: Negative for depression, hallucinations, substance abuse and suicidal ideas. The patient is nervous/anxious. The patient does not have insomnia.     Physical Exam: Estimated body mass index is 27.03 kg/m as calculated from the following:   Height as of this encounter: 5' 6.5" (1.689 m).   Weight as of this encounter: 170 lb (77.1 kg). BP 118/66   Pulse 67   Temp (!) 97.2 F (36.2 C)   Ht 5' 6.5" (1.689 m)   Wt 170 lb (77.1 kg)   LMP 08/03/2013   SpO2 97%   BMI 27.03 kg/m   General Appearance: Well nourished well developed, in no apparent distress.  Eyes: PERRLA, EOMs, conjunctiva no swelling or erythema ENT/Mouth: Ear canals normal without obstruction, swelling, erythema, or discharge.  TMs normal bilaterally with no erythema, bulging, retraction, or loss of landmark.  Oropharynx moist and clear with no exudate, erythema, or swelling.   Neck: Supple, thyroid normal. No bruits.  No  cervical adenopathy Respiratory: Respiratory effort normal, + wheezing RML without rhonchi, rales.   Cardio: RRR without murmurs, rubs or gallops. Brisk peripheral pulses without edema.  Chest: symmetric, with normal excursions Abdomen: Soft, nontender, no guarding, rebound, hernias, masses, or organomegaly.  Lymphatics: Non tender without lymphadenopathy.  Musculoskeletal: Full ROM all peripheral extremities,5/5 strength, and normal gait.  Skin: Warm, dry without rashes, lesions, ecchymosis. Neuro: Awake and oriented X 3, Cranial nerves intact, reflexes equal bilaterally. Normal muscle tone, no cerebellar symptoms. Sensation intact.  Psych:  normal affect, Insight and Judgment appropriate.   Over 40 minutes of exam, counseling, chart review and critical decision making was performed  EKG WNL, no ST changes  Vicie Mutters 8:18 PM [facility]

## 2019-10-01 LAB — URINALYSIS, ROUTINE W REFLEX MICROSCOPIC
Bacteria, UA: NONE SEEN /HPF
Bilirubin Urine: NEGATIVE
Glucose, UA: NEGATIVE
Hyaline Cast: NONE SEEN /LPF
Ketones, ur: NEGATIVE
Leukocytes,Ua: NEGATIVE
Nitrite: NEGATIVE
Protein, ur: NEGATIVE
Specific Gravity, Urine: 1.019 (ref 1.001–1.03)
Squamous Epithelial / HPF: NONE SEEN /HPF (ref <=5)
WBC, UA: NONE SEEN /HPF (ref 0–5)
pH: 6.5 (ref 5.0–8.0)

## 2019-10-01 LAB — MICROALBUMIN / CREATININE URINE RATIO
Creatinine, Urine: 98 mg/dL (ref 20–275)
Microalb Creat Ratio: 22 mcg/mg creat (ref <30)
Microalb, Ur: 2.2 mg/dL

## 2019-10-01 LAB — LIPID PANEL
Cholesterol: 266 mg/dL — ABNORMAL HIGH (ref <200)
HDL: 73 mg/dL (ref >=50)
LDL Cholesterol (Calc): 172 mg/dL (calc) — ABNORMAL HIGH
Non-HDL Cholesterol (Calc): 193 mg/dL (calc) — ABNORMAL HIGH (ref <130)
Total CHOL/HDL Ratio: 3.6 (calc) (ref <5.0)
Triglycerides: 94 mg/dL (ref <150)

## 2019-10-01 LAB — MAGNESIUM: Magnesium: 2 mg/dL (ref 1.5–2.5)

## 2019-10-01 LAB — IRON, TOTAL/TOTAL IRON BINDING CAP
%SAT: 31 % (calc) (ref 16–45)
Iron: 94 ug/dL (ref 45–160)
TIBC: 300 mcg/dL (calc) (ref 250–450)

## 2019-10-01 LAB — HEMOGLOBIN A1C
Hgb A1c MFr Bld: 5.4 % of total Hgb (ref <5.7)
Mean Plasma Glucose: 108 (calc)
eAG (mmol/L): 6 (calc)

## 2019-10-01 LAB — TSH: TSH: 1.73 mIU/L (ref 0.40–4.50)

## 2019-10-01 LAB — VITAMIN D 25 HYDROXY (VIT D DEFICIENCY, FRACTURES): Vit D, 25-Hydroxy: 19 ng/mL — ABNORMAL LOW (ref 30–100)

## 2019-10-01 LAB — VITAMIN B12: Vitamin B-12: 365 pg/mL (ref 200–1100)

## 2019-10-05 ENCOUNTER — Telehealth: Payer: Self-pay

## 2019-10-05 NOTE — Telephone Encounter (Signed)
Patient states that you were supposed to let her know what her blood type is. Please advise.

## 2019-10-06 NOTE — Telephone Encounter (Signed)
We can not check a blood type here. Suggest donating blood, they will give her the blood type.

## 2019-10-06 NOTE — Telephone Encounter (Signed)
Patient notified

## 2019-10-22 ENCOUNTER — Other Ambulatory Visit: Payer: Self-pay | Admitting: Physician Assistant

## 2019-10-22 DIAGNOSIS — G4700 Insomnia, unspecified: Secondary | ICD-10-CM

## 2019-10-29 ENCOUNTER — Telehealth: Payer: Self-pay | Admitting: Physician Assistant

## 2019-10-29 DIAGNOSIS — R0600 Dyspnea, unspecified: Secondary | ICD-10-CM

## 2019-10-29 DIAGNOSIS — R0609 Other forms of dyspnea: Secondary | ICD-10-CM

## 2019-10-29 NOTE — Telephone Encounter (Signed)
patient called to request cardiology referral to Dr Percival Spanish. Please advise.

## 2019-10-29 NOTE — Addendum Note (Signed)
Addended by: Vicie Mutters R on: 10/29/2019 12:35 PM   Modules accepted: Orders

## 2019-11-30 ENCOUNTER — Other Ambulatory Visit: Payer: Self-pay | Admitting: Hematology and Oncology

## 2019-12-02 ENCOUNTER — Encounter: Payer: Self-pay | Admitting: Cardiology

## 2019-12-02 DIAGNOSIS — Z7189 Other specified counseling: Secondary | ICD-10-CM | POA: Insufficient documentation

## 2019-12-02 DIAGNOSIS — R0609 Other forms of dyspnea: Secondary | ICD-10-CM | POA: Insufficient documentation

## 2019-12-02 DIAGNOSIS — R0600 Dyspnea, unspecified: Secondary | ICD-10-CM | POA: Insufficient documentation

## 2019-12-02 NOTE — Progress Notes (Signed)
Cardiology Office Note   Date:  12/03/2019   ID:  Rebecca [name], DOB October 02, 1955, MRN [id number]  PCP:  [name], MD  Cardiologist:   Minus Breeding, MD Referring:  [name], MD  Chief Complaint  Patient presents with  . Shortness of Breath      History of Present Illness: Rebecca [name] is a 64 y.o. female who is referred by [name], MD for evaluaiton of DOE.    She had an EF of 60 - 65% on echo in 2018.  This was at the time of treatment for breast cancer.  She had 4 cycles of AC.  She never had any issues with her heart.  She has noticed however that she has been getting short of breath walking up an incline for the past 4 months.  She walks a mile every day.  She might get a little chest tightness when she does this but she keeps on going at the top of the hill and things get better.  She is not describing any neck or arm discomfort.  She does not have any radiation elsewhere.  She has no associated nausea vomiting or diaphoresis.  She does not have any resting symptoms and is not describing PND or orthopnea.  She is otherwise never had any cardiac history or work-up.   Past Medical History:  Diagnosis Date  . Depression    REMISSION/ SITUATIONAL  . Dyspnea   . Hepatitis AGE 41   RESOLVED  . History of radiation therapy 09/24/17- 10/15/17   Right Breast treated to 42.56 Gy with 16 fx of 2.66 Gy  . Hyperlipidemia   . Insomnia   . Migraines    HORMONAL  . Personal history of chemotherapy   . Personal history of radiation therapy   . right breast ca dx' d 02/2017  . Shingles outbreak 2003, 2015   RIGHT HIP, and between breasts 3 years ago    Past Surgical History:  Procedure Laterality Date  . BREAST LUMPECTOMY Right 02/2017  . BREAST LUMPECTOMY WITH RADIOACTIVE SEED AND SENTINEL LYMPH NODE BIOPSY Right 03/19/2017   Procedure: RIGHT BREAST LUMPECTOMY WITH RADIOACTIVE SEED AND SENTINEL LYMPH NODE BIOPSY;  Surgeon: Excell Seltzer, MD;  Location:  [place];  Service: General;  Laterality: Right;  . KNEE ARTHROSCOPY Right 2002  . TUBAL LIGATION    . uterine polyps  2016     Current Outpatient Medications  Medication Sig Dispense Refill  . albuterol (VENTOLIN HFA) 108 (90 Base) MCG/ACT inhaler Inhale 2 puffs into the lungs every 4 (four) hours as needed for wheezing or shortness of breath. 18 g 1  . Eszopiclone (ESZOPICLONE) 3 MG TABS Take 1 tablet (3 mg total) by mouth at bedtime as needed. Take immediately before bedtime 30 tablet 0  . ibuprofen (ADVIL,MOTRIN) 200 MG tablet Take 200 mg by mouth every 6 (six) hours as needed for mild pain.    Marland Kitchen letrozole (FEMARA) 2.5 MG tablet TAKE 1 TABLET BY MOUTH EVERY DAY 90 tablet 3  . LORazepam (ATIVAN) 0.5 MG tablet Take 1 tablet (0.5 mg total) by mouth at bedtime as needed. 30 tablet 1  . traZODone (DESYREL) 50 MG tablet Take 1/2 to 1 tablet 1 hour before Bedtime if needed for Sleep 90 tablet 1   No current facility-administered medications for this visit.    Allergies:   Cholecalciferol, Citalopram, Citalopram, Dexamethasone, Dexamethasone, Dilaudid [hydromorphone hcl], Propoxycaine, Red yeast rice [cholestin], Rofecoxib, and Sulfamethoxazole  Social History:  The patient  reports that she quit smoking about 11 years ago. She has never used smokeless tobacco. She reports that she does not drink alcohol or use drugs.   Family History:  The patient's family history includes Breast cancer in her sister; Cancer in her brother and paternal grandfather; Cancer (age of onset: 64) in her sister; Cancer (age of onset: 74) in her father; Cervical cancer in her maternal aunt; Dementia in her maternal aunt and mother; Depression in her brother; Heart disease in her maternal grandfather; Lung cancer in her maternal uncle; Lung cancer (age of onset: 75) in her brother; Other in her paternal grandmother; Stroke (age of onset: 69) in her maternal grandmother.    ROS:  Please see the  history of present illness.   Otherwise, review of systems are positive for none.   All other systems are reviewed and negative.    PHYSICAL EXAM: VS:  BP 120/82   Pulse 73   Temp (!) 96.9 F (36.1 C)   Ht 5' 6.5" (1.689 m)   Wt 173 lb 9.6 oz (78.7 kg)   LMP 08/03/2013   SpO2 97%   BMI 27.60 kg/m  , BMI Body mass index is 27.6 kg/m. GENERAL:  Well appearing HEENT:  Pupils equal round and reactive, fundi not visualized, oral mucosa unremarkable NECK:  No jugular venous distention, waveform within normal limits, carotid upstroke brisk and symmetric, no bruits, no thyromegaly LYMPHATICS:  No cervical, inguinal adenopathy LUNGS:  Clear to auscultation bilaterally BACK:  No CVA tenderness CHEST:  Unremarkable HEART:  PMI not displaced or sustained,S1 and S2 within normal limits, no S3, no S4, no clicks, no rubs, no murmurs ABD:  Flat, positive bowel sounds normal in frequency in pitch, no bruits, no rebound, no guarding, no midline pulsatile mass, no hepatomegaly, no splenomegaly EXT:  2 plus pulses throughout, no edema, no cyanosis no clubbing SKIN:  No rashes no nodules NEURO:  Cranial nerves II through XII grossly intact, motor grossly intact throughout PSYCH:  Cognitively intact, oriented to person place and time    EKG:  EKG is ordered today. The ekg ordered today demonstrates sinus rhythm, rate 73, axis within normal limits, intervals within normal limits, no acute hemic changes.   Recent Labs: 09/30/2019: Magnesium 2.0; TSH 1.73    Lipid Panel    Component Value Date/Time   CHOL 266 (H) 09/30/2019 1506   TRIG 94 09/30/2019 1506   HDL 73 09/30/2019 1506   CHOLHDL 3.6 09/30/2019 1506   VLDL 17 04/07/2017 0931   LDLCALC 172 (H) 09/30/2019 1506      Wt Readings from Last 3 Encounters:  12/03/19 173 lb 9.6 oz (78.7 kg)  09/30/19 170 lb (77.1 kg)  08/14/18 160 lb 9.6 oz (72.8 kg)      Other studies Reviewed: Additional studies/ records that were reviewed today  include: Labs. Review of the above records demonstrates:  Please see elsewhere in the note.     ASSESSMENT AND PLAN:  DOE:    I do not strongly suspect a cardiac etiology.  However, I will check a BNP level.  I will start with a coronary calcium score and further testing will be based on the results of this.  DYSLIPIDEMIA: LDL was 172, HDL 73.  Goals of therapy will be based on the results of the calcium score.  COVID EDUCATION:  She will sign up for the vaccine.    Current medicines are reviewed at length with the  patient today.  The patient does not have concerns regarding medicines.  The following changes have been made:  no change  Labs/ tests ordered today include:   Orders Placed This Encounter  Procedures  . CT CARDIAC SCORING  . Brain natriuretic peptide  . EKG 12-Lead     Disposition:   FU with me as needed.      Signed, Minus Breeding, MD  12/03/2019 1:58 PM    [facility]

## 2019-12-03 ENCOUNTER — Other Ambulatory Visit: Payer: Self-pay

## 2019-12-03 ENCOUNTER — Encounter: Payer: Self-pay | Admitting: Cardiology

## 2019-12-03 ENCOUNTER — Ambulatory Visit (INDEPENDENT_AMBULATORY_CARE_PROVIDER_SITE_OTHER): Payer: 59 | Admitting: Cardiology

## 2019-12-03 VITALS — BP 120/82 | HR 73 | Temp 96.9°F | Ht 66.5 in | Wt 173.6 lb

## 2019-12-03 DIAGNOSIS — R0602 Shortness of breath: Secondary | ICD-10-CM

## 2019-12-03 DIAGNOSIS — Z7189 Other specified counseling: Secondary | ICD-10-CM

## 2019-12-03 DIAGNOSIS — R0600 Dyspnea, unspecified: Secondary | ICD-10-CM | POA: Diagnosis not present

## 2019-12-03 DIAGNOSIS — R0609 Other forms of dyspnea: Secondary | ICD-10-CM

## 2019-12-03 NOTE — Patient Instructions (Signed)
Medication Instructions:  No changes *If you need a refill on your cardiac medications before your next appointment, please call your pharmacy*  Lab Work: Your physician recommends that you return for lab work today (BNP)  If you have labs (blood work) drawn today and your tests are completely normal, you will receive your results only by: Marland Kitchen MyChart Message (if you have MyChart) OR . A paper copy in the mail If you have any lab test that is abnormal or we need to change your treatment, we will call you to review the results.  Testing/Procedures: Dr. Percival Spanish has ordered a CT coronary calcium score. This test is done at [address]. This is $150 out of pocket. Coronary CalciumScan A coronary calcium scan is an imaging test used to look for deposits of calcium and other fatty materials (plaques) in the inner lining of the blood vessels of the heart (coronary arteries). These deposits of calcium and plaques can partly clog and narrow the coronary arteries without producing any symptoms or warning signs. This puts a person at risk for a heart attack. This test can detect these deposits before symptoms develop. Tell a health care provider about:  Any allergies you have.  All medicines you are taking, including vitamins, herbs, eye drops, creams, and over-the-counter medicines.  Any problems you or family members have had with anesthetic medicines.  Any blood disorders you have.  Any surgeries you have had.  Any medical conditions you have.  Whether you are pregnant or may be pregnant. What are the risks? Generally, this is a safe procedure. However, problems may occur, including:  Harm to a pregnant woman and her unborn baby. This test involves the use of radiation. Radiation exposure can be dangerous to a pregnant woman and her unborn baby. If you are pregnant, you generally should not have this procedure done.  Slight increase in the risk of cancer. This is  because of the radiation involved in the test. What happens before the procedure? No preparation is needed for this procedure. What happens during the procedure?  You will undress and remove any jewelry around your neck or chest.  You will put on a hospital gown.  Sticky electrodes will be placed on your chest. The electrodes will be connected to an electrocardiogram (ECG) machine to record a tracing of the electrical activity of your heart.  A CT scanner will take pictures of your heart. During this time, you will be asked to lie still and hold your breath for 2-3 seconds while a picture of your heart is being taken. The procedure may vary among health care providers and hospitals. What happens after the procedure?  You can get dressed.  You can return to your normal activities.  It is up to you to get the results of your test. Ask your health care provider, or the department that is doing the test, when your results will be ready. Summary  A coronary calcium scan is an imaging test used to look for deposits of calcium and other fatty materials (plaques) in the inner lining of the blood vessels of the heart (coronary arteries).  Generally, this is a safe procedure. Tell your health care provider if you are pregnant or may be pregnant.  No preparation is needed for this procedure.  A CT scanner will take pictures of your heart.  You can return to your normal activities after the scan is done. This information is not intended to replace advice given  to you by your health care provider. Make sure you discuss any questions you have with your health care provider. Document Released: 02/08/2008 Document Revised: 07/01/2016 Document Reviewed: 07/01/2016 Elsevier Interactive Patient Education  2017 [place]: At [facility], you and your health needs are our priority.  As part of our continuing mission to provide you with exceptional heart care, we have created  designated Provider Care Teams.  These Care Teams include your primary Cardiologist (physician) and Advanced Practice Providers (APPs -  Physician Assistants and Nurse Practitioners) who all work together to provide you with the care you need, when you need it.   Your next appointment:   FOLLOW UP AS NEEDED

## 2019-12-04 LAB — BRAIN NATRIURETIC PEPTIDE: BNP: 25.7 pg/mL (ref 0.0–100.0)

## 2019-12-06 ENCOUNTER — Ambulatory Visit: Payer: BLUE CROSS/BLUE SHIELD | Admitting: Hematology and Oncology

## 2019-12-13 ENCOUNTER — Ambulatory Visit (INDEPENDENT_AMBULATORY_CARE_PROVIDER_SITE_OTHER)
Admission: RE | Admit: 2019-12-13 | Discharge: 2019-12-13 | Disposition: A | Discharge status: Home / Self Care | Payer: Self-pay | Source: Ambulatory Visit | Attending: Cardiology | Admitting: Cardiology

## 2019-12-13 ENCOUNTER — Other Ambulatory Visit: Payer: Self-pay

## 2019-12-13 DIAGNOSIS — R0602 Shortness of breath: Secondary | ICD-10-CM

## 2019-12-22 ENCOUNTER — Telehealth: Payer: Self-pay | Admitting: Cardiology

## 2019-12-22 NOTE — Telephone Encounter (Signed)
See result note.  

## 2019-12-22 NOTE — Telephone Encounter (Signed)
° °  Pt is returning call from [facility] regarding Ct results  Please call

## 2020-03-02 NOTE — Progress Notes (Signed)
Patient Care Team: Vicie Mutters, PA-C as PCP - General (Physician Assistant) Minus Breeding, MD as PCP - Cardiology (Cardiology) [name], MD (Internal Medicine) Arta Silence, MD as Consulting Physician (Gastroenterology) Arvella Nigh, MD as Consulting Physician (Obstetrics and Gynecology) Nicholas Lose, MD as Consulting Physician (Hematology and Oncology) Eppie Gibson, MD as Attending Physician (Radiation Oncology) Excell Seltzer, MD (Inactive) as Consulting Physician (General Surgery)  DIAGNOSIS:    ICD-10-CM   1. 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  The STAT Breast cancer panel offered by [facility] includes sequencing and rearrangement analysis for the following 9 genes:  ATM, BRCA1, BRCA2, CDH1, CHEK2, PALB2, PTEN, STK11 and TP53.   The report date is March 09, 2017.  Re-requitisioned to the common hereditary cancer panel and the melanoma panel.  The additional genes tested were negative.  The Hereditary Gene Panel offered by [facility] includes sequencing and/or deletion duplication testing of the following 46 genes: APC, ATM, AXIN2, BARD1, BMPR1A, BRCA1, BRCA2, BRIP1, CDH1, CDKN2A (p14ARF), CDKN2A (p16INK4a), CHEK2, CTNNA1, DICER1, EPCAM (Deletion/duplication testing only),  GREM1 (promoter region deletion/duplication testing only), KIT, MEN1, MLH1, MSH2, MSH3, MSH6, MUTYH, NBN, NF1, NHTL1, PALB2, PDGFRA, PMS2, POLD1, POLE, PTEN, RAD50, RAD51C, RAD51D, SDHB, SDHC, SDHD, SMAD4, SMARCA4. STK11, TP53, TSC1, TSC2, and VHL.  The following genes were evaluated for sequence changes only: SDHA and HOXB13 c.251G>A variant only.  The Melanoma panel offered by [facility] includes sequencing and/or deletion duplication testing of the following 12 genes: BAP1, BRCA1, BRCA2, BRIP1, CDK4, CDKN2A (p14ARF), CDKN2A (p16INK4a), MC1R, POT1, PTEN, RB1, TERT, and TP53.  The following gene was evaluated for sequence changes only: MITF (c.952G>A, p.GLU318Lys variant only). The updated report date is March 14, 2017.     03/19/2017 Surgery   Right lumpectomy: IDC with DCIS, 1.2 cm, grade 2, margins negative, 0/2 lymph nodes negative, ER 100%, PR 10%, HER-2 negative ratio 1.73, Ki-67 40% T1BN0 stage IA   04/01/2017 Oncotype testing   Oncotype Dx score: 41 (28% ROR with Tamoxifen alone)   04/16/2017 - 08/27/2017 Chemotherapy   Dose dense Adriamycin and Cytoxan 4 followed by Taxol weekly 12    09/24/2017 - 10/17/2017 Radiation Therapy   Adjuvant radiation   11/28/2017 -  Anti-estrogen oral therapy   Letrozole 2.5 mg daily     CHIEF COMPLIANT: Follow-up of right breast cancer on letrozole therapy  INTERVAL HISTORY: Rebecca [name] is a 64 y.o. with above-mentioned history of right breast cancer currently on letrozole therapy. Mammogram on 05/05/19 showed no evidence of malignancy bilaterally. I last saw her 2 years ago, and in the interim she saw Dr. Humphrey Rolls at [facility] due to insurance changes. She presents to the clinic today for follow-up.   ALLERGIES:  is allergic to cholecalciferol, citalopram, citalopram, dexamethasone, dexamethasone, dilaudid [hydromorphone hcl], propoxycaine, red yeast  rice [cholestin], rofecoxib, and sulfamethoxazole.  MEDICATIONS:  Current Outpatient Medications  Medication  Sig Dispense Refill  . albuterol (VENTOLIN HFA) 108 (90 Base) MCG/ACT inhaler Inhale 2 puffs into the lungs every 4 (four) hours as needed for wheezing or shortness of breath. 18 g 1  . Eszopiclone (ESZOPICLONE) 3 MG TABS Take 1 tablet (3 mg total) by mouth at bedtime as needed. Take immediately before bedtime 30 tablet 0  . ibuprofen (ADVIL,MOTRIN) 200 MG tablet Take 200 mg by mouth every 6 (six) hours as needed for mild pain.    Marland Kitchen letrozole (FEMARA) 2.5 MG tablet TAKE 1 TABLET BY MOUTH EVERY DAY 90 tablet 3  . LORazepam (ATIVAN) 0.5 MG tablet Take 1 tablet (0.5 mg total) by mouth at bedtime as needed. 30 tablet 1  . traZODone (DESYREL) 50 MG tablet Take 1/2 to 1 tablet 1 hour before Bedtime if needed for Sleep 90 tablet 1   No current facility-administered medications for this visit.    PHYSICAL EXAMINATION: ECOG PERFORMANCE STATUS: 1 - Symptomatic but completely ambulatory  Vitals:   03/03/20 1026  BP: 128/71  Pulse: 72  Resp: 17  Temp: 98.9 F (37.2 C)  SpO2: 98%   Filed Weights   03/03/20 1026  Weight: 173 lb 4.8 oz (78.6 kg)    BREAST: No palpable masses or nodules in either right or left breasts. No palpable axillary supraclavicular or infraclavicular adenopathy no breast tenderness or nipple discharge. (exam performed in the presence of a chaperone)  LABORATORY DATA:  I have reviewed the data as listed CMP Latest Ref Rng & Units 04/29/2018 10/22/2017 10/02/2017  Glucose 65 - 99 mg/dL 73 101 96  BUN 7 - 25 mg/dL '12 12 16  '$ Creatinine 0.50 - 0.99 mg/dL 0.75 0.73 0.67  Sodium 135 - 146 mmol/L 139 140 137  Potassium 3.5 - 5.3 mmol/L 3.9 4.1 4.1  Chloride 98 - 110 mmol/L 102 105 103  CO2 20 - 32 mmol/L '29 26 26  '$ Calcium 8.6 - 10.4 mg/dL 9.2 9.0 8.9  Total Protein 6.1 - 8.1 g/dL 6.7 6.8 6.7  Total Bilirubin 0.2 - 1.2 mg/dL 0.8 0.8 0.9  Alkaline Phos 40 - 150 U/L - 66 70  AST 10 - 35 U/L '16 21 22  '$ ALT 6 - 29 U/L '13 25 31    '$ Lab Results  Component Value Date   WBC 6.7  04/29/2018   HGB 13.3 04/29/2018   HCT 39.8 04/29/2018   MCV 91.9 04/29/2018   PLT 210 04/29/2018   NEUTROABS 4,623 04/29/2018    ASSESSMENT & PLAN:  Carcinoma of upper-outer quadrant of right breast in female, estrogen receptor positive ([place]) 03/19/2017: Right lumpectomy: IDC with DCIS, 1.2 cm, grade 2, margins negative, 0/2 lymph nodes negative, ER 100%, PR 10%, HER-2 negative ratio 1.73, Ki-67 40% T1BN0 stage IA CHEK 2 Mutation Positive Oncotype Dx score: 41 (28% ROR with Tamoxifen alone) Treatment plan: 1. Adjuvant chemotherapy with dose dense Adriamycin Cytoxan 4 followed by weekly Taxol 12 2. followed by adjuvant radiation1/31/19-2/20/19 3. Followed by adjuvant antiestrogen therapy ----------------------------------------------------------------------------  Current treatment: Letrozole 2.5 mg daily started 11/26/2017 Letrozole toxicities: 1.  Depression towards the end of the day: Patient also has a boss who has not been kind towards her 2.  Muscle cramps: Instructed her to take letrozole at bedtime  Patient has a grand child coming in July.  She is going to [place] to see the baby.  She is very excited about that.  Breast cancer  surveillance: 1.  Breast exam 03/03/2020: Benign 2. Mammogram: This will be scheduled for September 2021.  Last year she had a mammogram at [facility].  Return to clinic in 1 year for follow-up.     No orders of the defined types were placed in this encounter.  The patient has a good understanding of the overall plan. she agrees with it. she will call with any problems that may develop before the next visit here.  Total time spent: 20 mins including face to face time and time spent for planning, charting and coordination of care  Nicholas Lose, MD 03/03/2020  I, Cloyde Reams Dorshimer, am acting as scribe for Dr. Nicholas Lose.  I have reviewed the above documentation for accuracy and completeness, and I agree with the above.

## 2020-03-03 ENCOUNTER — Other Ambulatory Visit: Payer: Self-pay

## 2020-03-03 ENCOUNTER — Telehealth: Payer: Self-pay | Admitting: Hematology and Oncology

## 2020-03-03 ENCOUNTER — Inpatient Hospital Stay
Payer: No Typology Code available for payment source | Attending: Hematology and Oncology | Admitting: Hematology and Oncology

## 2020-03-03 ENCOUNTER — Encounter: Payer: Self-pay | Admitting: Genetic Counselor

## 2020-03-03 DIAGNOSIS — Z923 Personal history of irradiation: Secondary | ICD-10-CM | POA: Insufficient documentation

## 2020-03-03 DIAGNOSIS — Z170 Estrogen receptor positive status [ER+]: Secondary | ICD-10-CM | POA: Diagnosis not present

## 2020-03-03 DIAGNOSIS — Z79811 Long term (current) use of aromatase inhibitors: Secondary | ICD-10-CM | POA: Insufficient documentation

## 2020-03-03 DIAGNOSIS — Z9221 Personal history of antineoplastic chemotherapy: Secondary | ICD-10-CM | POA: Diagnosis not present

## 2020-03-03 DIAGNOSIS — C50411 Malignant neoplasm of upper-outer quadrant of right female breast: Secondary | ICD-10-CM | POA: Diagnosis present

## 2020-03-03 DIAGNOSIS — Z79899 Other long term (current) drug therapy: Secondary | ICD-10-CM | POA: Insufficient documentation

## 2020-03-03 NOTE — Telephone Encounter (Signed)
Scheduled per 7/9 los, gave pt calendar printout, pt declined AVS

## 2020-03-03 NOTE — Assessment & Plan Note (Signed)
03/19/2017: Right lumpectomy: IDC with DCIS, 1.2 cm, grade 2, margins negative, 0/2 lymph nodes negative, ER 100%, PR 10%, HER-2 negative ratio 1.73, Ki-67 40% T1BN0 stage IA CHEK 2 Mutation Positive Oncotype Dx score: 41 (28% ROR with Tamoxifen alone) Treatment plan: 1. Adjuvant chemotherapy with dose dense Adriamycin Cytoxan 4 followed by weekly Taxol 12 2. followed by adjuvant radiation1/31/19-2/20/19 3. Followed by adjuvant antiestrogen therapy ----------------------------------------------------------------------------  Current treatment: Letrozole 2.5 mg daily started 11/26/2017 Letrozole toxicities: 1.  Depression towards the end of the day: Patient also has a boss who has not been kind towards her 2.  Muscle cramps: Instructed her to take letrozole at bedtime  Patient has a grand child coming in July.  She is going to [place] to see the baby.  She is very excited about that.  Breast cancer surveillance: 1.  Breast exam 03/03/2020: Benign 2. Mammogram:  Return to clinic in 1 year for follow-up.

## 2020-05-11 ENCOUNTER — Other Ambulatory Visit: Payer: Self-pay

## 2020-05-11 ENCOUNTER — Ambulatory Visit
Admission: RE | Admit: 2020-05-11 | Discharge: 2020-05-11 | Disposition: A | Discharge status: Home / Self Care | Payer: No Typology Code available for payment source | Source: Ambulatory Visit | Attending: Hematology and Oncology | Admitting: Hematology and Oncology

## 2020-05-11 DIAGNOSIS — Z170 Estrogen receptor positive status [ER+]: Secondary | ICD-10-CM

## 2020-06-01 ENCOUNTER — Encounter: Payer: BLUE CROSS/BLUE SHIELD | Admitting: Adult Health Nurse Practitioner

## 2020-06-08 ENCOUNTER — Other Ambulatory Visit: Payer: Self-pay

## 2020-06-08 ENCOUNTER — Ambulatory Visit (INDEPENDENT_AMBULATORY_CARE_PROVIDER_SITE_OTHER): Payer: No Typology Code available for payment source | Admitting: Adult Health Nurse Practitioner

## 2020-06-08 ENCOUNTER — Encounter: Payer: Self-pay | Admitting: Adult Health Nurse Practitioner

## 2020-06-08 VITALS — BP 128/80 | Temp 97.5°F | Wt 173.8 lb

## 2020-06-08 DIAGNOSIS — Z1152 Encounter for screening for COVID-19: Secondary | ICD-10-CM | POA: Diagnosis not present

## 2020-06-08 DIAGNOSIS — J0140 Acute pansinusitis, unspecified: Secondary | ICD-10-CM

## 2020-06-08 DIAGNOSIS — J029 Acute pharyngitis, unspecified: Secondary | ICD-10-CM

## 2020-06-08 LAB — POC COVID19 BINAXNOW: SARS Coronavirus 2 Ag: NEGATIVE

## 2020-06-08 LAB — POCT RAPID STREP A (OFFICE): Rapid Strep A Screen: NEGATIVE

## 2020-06-08 MED ORDER — AZITHROMYCIN 250 MG PO TABS: ORAL_TABLET | ORAL | 1 refills | Status: AC

## 2020-06-08 NOTE — Progress Notes (Signed)
Assessment and Plan:  Rebecca [name] was seen today for acute visit and sore throat.  Diagnoses and all orders for this visit:  Acute non-recurrent pansinusitis -     azithromycin (ZITHROMAX) 250 MG tablet; Take 2 tablets (500 mg) on  Day 1,  followed by 1 tablet (250 mg) once daily on Days 2 through 5. Discussed continue to gargle with salt water and spit out, ibuprofen, cholraseptic spray  Sore throat -     POCT rapid strep A - Negative  Encounter for screening for COVID-19 -     POC COVID-19 BinaxNow - Negative       Further disposition pending results of labs. Discussed med's effects and SE's.   Over 30 minutes of exam, counseling, chart review, and critical decision making was performed.   Future Appointments  Date Time Provider [place]  06/08/2020  2:30 PM Garnet Sierras, NP [facility] None  10/02/2020  2:00 PM Garnet Sierras, NP [facility] None  03/02/2021 11:00 AM Nicholas Lose, MD [facility] None    ------------------------------------------------------------------------------------------------------------------   HPI 64 y.o.female presents for evaluation for sore throat that started three days ago.  She reports she has gargled with salt water.  She has also used hot, cold liquids that is soothing.  She did try tylenol which was not effective for her.  She reports sinus tenderness that is constant.  She denies any headache, vision change, shortness of breath, chest pains numbness or tingling.   Past Medical History:  Diagnosis Date   Depression    REMISSION/ SITUATIONAL   Dyspnea    Hepatitis AGE 73   RESOLVED   History of radiation therapy 09/24/17- 10/15/17   Right Breast treated to 42.56 Gy with 16 fx of 2.66 Gy   Hyperlipidemia    Insomnia    Migraines    HORMONAL   Personal history of chemotherapy    Personal history of radiation therapy    right breast ca dx' d 02/2017   Shingles outbreak 2003, 2015   RIGHT HIP, and between breasts 3  years ago     Allergies  Allergen Reactions   Cholecalciferol Other (See Comments)    Headache   Citalopram Other (See Comments)    HYPERACTIVITY   Citalopram     Hyper   Dexamethasone Other (See Comments)    CHEST TIGHTNESS   Dexamethasone     Chest tightness   Dilaudid [Hydromorphone Hcl] Nausea And Vomiting   Propoxycaine Nausea And Vomiting   Red Yeast Rice [Cholestin] Other (See Comments)    headache   Rofecoxib Nausea And Vomiting   Sulfamethoxazole Swelling   Patient Care Team: Delfina Redwood as PCP - General (Physician Assistant) Minus Breeding, MD as PCP - Cardiology (Cardiology) [name], MD (Internal Medicine) Arta Silence, MD as Consulting Physician (Gastroenterology) Arvella Nigh, MD as Consulting Physician (Obstetrics and Gynecology) Nicholas Lose, MD as Consulting Physician (Hematology and Oncology) Eppie Gibson, MD as Attending Physician (Radiation Oncology) Excell Seltzer, MD (Inactive) as Consulting Physician (General Surgery)    Screening Tests: Immunization History  Administered Date(s) Administered   Influenza Inj Mdck Quad With Preservative 06/26/2018   Influenza,inj,Quad PF,6+ Mos 06/25/2017   Td 08/27/2007   Tdap 03/16/2018    SARS-COV2-Pfizer-Complete 12/2019  Current Outpatient Medications on File Prior to Visit  Medication Sig   ibuprofen (ADVIL,MOTRIN) 200 MG tablet Take 200 mg by mouth every 6 (six) hours as needed for mild pain.   letrozole (FEMARA) 2.5 MG tablet TAKE 1 TABLET BY MOUTH  EVERY DAY   LORazepam (ATIVAN) 0.5 MG tablet Take 1 tablet (0.5 mg total) by mouth at bedtime as needed.   No current facility-administered medications on file prior to visit.    ROS: all negative except above.   Physical Exam:  BP 128/80    Temp (!) 97.5 F (36.4 C)    Wt 173 lb 12.8 oz (78.8 kg)    LMP 08/03/2013    BMI 27.63 kg/m   General Appearance: Well nourished, in no apparent distress. Eyes:  PERRLA, EOMs, conjunctiva no swelling or erythema Sinuses: Frontal/maxillary tenderness ENT/Mouth: Ext aud canals clear, TMs without erythema, bulging. No erythema, swelling, or exudate on post pharynx.  Tonsils not swollen or erythematous. Hearing normal.  Neck: Supple, thyroid normal.  Respiratory: Respiratory effort normal, BS equal bilaterally without rales, rhonchi, wheezing or stridor.  Cardio: RRR with no MRGs. Brisk peripheral pulses without edema.  Abdomen: Soft, + BS.  Non tender, no guarding, rebound, hernias, masses. Lymphatics: Non tender without lymphadenopathy.  Musculoskeletal: Full ROM, 5/5 strength, normal gait.  Skin: Warm, dry without rashes, lesions, ecchymosis.  Neuro: Cranial nerves intact. Normal muscle tone, no cerebellar symptoms. Sensation intact.  Psych: Awake and oriented X 3, normal affect, Insight and Judgment appropriate.     Garnet Sierras, NP 2:24 PM [facility]

## 2020-06-08 NOTE — Patient Instructions (Signed)
   You can use ibuprofen 600mg  every 6hours OR 800mg  every 8hours for inflammation/pain for your throat.  You can continue to gargle warm salt water, spit out.  You can also use chloraseptic spray or throat lozenges to help with the discomfort.   We will send in azithromycin for you to take.  Take two tablets today and one tablet daily until gone.  Pease send [place] a message if you have any new or worsening symptoms.   You COVID-19 test today was negative.  If you continue to have symptoms in 4 days you should have a repeat COVID test, rapid antigen test OR PCR.

## 2020-09-22 ENCOUNTER — Other Ambulatory Visit: Payer: Self-pay | Admitting: Hematology and Oncology

## 2020-10-02 ENCOUNTER — Encounter: Payer: No Typology Code available for payment source | Admitting: Adult Health Nurse Practitioner

## 2020-10-05 ENCOUNTER — Other Ambulatory Visit: Payer: Self-pay

## 2020-10-05 ENCOUNTER — Ambulatory Visit: Payer: No Typology Code available for payment source | Admitting: Adult Health Nurse Practitioner

## 2020-10-05 ENCOUNTER — Encounter: Payer: Self-pay | Admitting: Adult Health Nurse Practitioner

## 2020-10-05 VITALS — BP 118/70 | HR 76 | Temp 97.6°F | Ht 67.0 in | Wt 174.0 lb

## 2020-10-05 DIAGNOSIS — G43809 Other migraine, not intractable, without status migrainosus: Secondary | ICD-10-CM

## 2020-10-05 DIAGNOSIS — R7309 Other abnormal glucose: Secondary | ICD-10-CM | POA: Diagnosis not present

## 2020-10-05 DIAGNOSIS — I10 Essential (primary) hypertension: Secondary | ICD-10-CM | POA: Diagnosis not present

## 2020-10-05 DIAGNOSIS — Z808 Family history of malignant neoplasm of other organs or systems: Secondary | ICD-10-CM

## 2020-10-05 DIAGNOSIS — M542 Cervicalgia: Secondary | ICD-10-CM

## 2020-10-05 DIAGNOSIS — Z170 Estrogen receptor positive status [ER+]: Secondary | ICD-10-CM

## 2020-10-05 DIAGNOSIS — Z1329 Encounter for screening for other suspected endocrine disorder: Secondary | ICD-10-CM

## 2020-10-05 DIAGNOSIS — Z0001 Encounter for general adult medical examination with abnormal findings: Secondary | ICD-10-CM

## 2020-10-05 DIAGNOSIS — C50919 Malignant neoplasm of unspecified site of unspecified female breast: Secondary | ICD-10-CM

## 2020-10-05 DIAGNOSIS — R202 Paresthesia of skin: Secondary | ICD-10-CM

## 2020-10-05 DIAGNOSIS — E785 Hyperlipidemia, unspecified: Secondary | ICD-10-CM | POA: Diagnosis not present

## 2020-10-05 DIAGNOSIS — Z1321 Encounter for screening for nutritional disorder: Secondary | ICD-10-CM

## 2020-10-05 DIAGNOSIS — E559 Vitamin D deficiency, unspecified: Secondary | ICD-10-CM

## 2020-10-05 DIAGNOSIS — Z1389 Encounter for screening for other disorder: Secondary | ICD-10-CM

## 2020-10-05 DIAGNOSIS — C50411 Malignant neoplasm of upper-outer quadrant of right female breast: Secondary | ICD-10-CM

## 2020-10-05 DIAGNOSIS — Z136 Encounter for screening for cardiovascular disorders: Secondary | ICD-10-CM

## 2020-10-05 DIAGNOSIS — Z130 Encounter for screening for diseases of the blood and blood-forming organs and certain disorders involving the immune mechanism: Secondary | ICD-10-CM

## 2020-10-05 DIAGNOSIS — Z1509 Genetic susceptibility to other malignant neoplasm: Secondary | ICD-10-CM

## 2020-10-05 MED ORDER — GABAPENTIN 100 MG PO CAPS: 100.0000 mg | ORAL_CAPSULE | Freq: Three times a day (TID) | ORAL | 0 refills | Status: DC

## 2020-10-05 MED ORDER — MELOXICAM 15 MG PO TABS: 15.0000 mg | ORAL_TABLET | Freq: Every day | ORAL | 0 refills | Status: DC

## 2020-10-05 NOTE — Progress Notes (Signed)
COMPLETE PHYSICAL  Assessment and Plan:  Encounter for general adult medical examination with abnormal findings 1 year  Hyperlipidemia, unspecified hyperlipidemia type -     Lipid panel check lipids decrease fatty foods increase activity.   Abnormal glucose -     Hemoglobin A1c 'Discussed disease progression and risks Discussed diet/exercise, weight management and risk modification  Vitamin D deficiency -     VITAMIN D 25 Hydroxy (Vit-D Deficiency, Fractures)  Family history of melanoma Monitor skin, no issues at this time.   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 Lindi Adie again    Screening, anemia, deficiency, iron -     Iron,Total/Total Iron Binding Cap  Screening, ischemic heart disease -     EKG 12-Lead  Screening for blood or protein in urine -     Urinalysis w microscopic + reflex cultur  Encounter for vitamin deficiency screening -     Vitamin B12  Screening for thyroid disorder -     TSH  Neck pain -     meloxicam (MOBIC) 15 MG tablet; Take 1 tablet (15 mg total) by mouth daily. -     gabapentin (NEURONTIN) 100 MG capsule; Take 1 capsule (100 mg total) by mouth 3 (three) times daily.  Paresthesia of right arm -     gabapentin (NEURONTIN) 100 MG capsule; Take 1 capsule (100 mg total) by mouth 3 (three) times daily.   Insomnia, unspecified type -     traZODone (DESYREL) 50 MG tablet; 1/2-1 tablet for sleep -     Eszopiclone (ESZOPICLONE) 3 MG TABS; Take 1 tablet (3 mg total) by mouth at bedtime as needed. Take immediately before bedtime -     TSH - rarely takes lorazepam, will try trazodone and lunesta to replace it, patient knows to not take together  Discussed med's effects and SE's. Screening labs and tests as requested with  regular follow-up as recommended.   Further disposition pending results if labs check today. Discussed med's effects and SE's.   Over 30 minutes of face to face interview, exam, counseling, chart review, and critical decision making was performed.   Future Appointments  Date Time Provider [place]  03/02/2021 11:00 AM Nicholas Lose, MD [facility] None  10/08/2021  2:00 PM Timoth Schara, Danton Sewer, NP [facility] None    HPI  65 y.o. female  presents for a complete physical.  Her blood pressure has been controlled at home, today their BP is BP: 118/70.  She does workout. She denies chest pain,  dizziness.   Reports she is having DOE, chronic.  She reports seh is very short of breath after going up steps.  Denies any wheezing, PND She does not exercise. She is having pain between her shoulder blades.  It is constant, worse with typing or working.  Nothing makes the pain better.  She does take ibuprofen one tablet something twice a day at times she takes two tables.   She works for a Restaurant manager, fast food, she did ultrasound therapy on this that helped some but flared back up once she started working again.   She had a Right lumpectomy on 03/19/2017 with Dr. Excell Seltzer: IDC with DCIS, 1.2 cm, grade 2, margins negative, 0/2 lymph nodes negative, ER 100%, PR 10%, HER-2 negative ratio 1.73, Ki-67 40% T1BN0 stage IA and has been getting systemic chemo,  finished Jan 2019, going to follow back up with [facility], Dr. Lindi Adie. NEXT OV is 02/2021.  From previous OV: She states she has DOE with hills or with stairs for 5-6 months. No coughing or wheezing. She denies chest pain, leg swelling, dizziness, sweating. No CXR, echo 2018 EF 65%.  History of smoking 15 years ago, smoked 25 years just on weekends.   Had shingles again, this is the 4th time, has not had shingles vaccine.  Today: BMI is Body mass index is 27.25 kg/m., she is working on diet and exercise. Wt Readings from Last 3 Encounters:   10/05/20 174 lb (78.9 kg)  06/08/20 173 lb 12.8 oz (78.8 kg)  03/03/20 173 lb 4.8 oz (78.6 kg)   She is on ativan PRN for sleep, takes every 2 weeks but does not help.  States she needs 3-4 hours of sleep a night and feels rested with that.   She is on cholesterol medication, not fasting and denies myalgias. Her cholesterol is not at goal. The cholesterol last visit was:  Lab Results  Component Value Date   CHOL 266 (H) 09/30/2019   HDL 73 09/30/2019   LDLCALC 172 (H) 09/30/2019   TRIG 94 09/30/2019   CHOLHDL 3.6 09/30/2019  . She has been working on diet and exercise for prediabetes,  and denies foot ulcerations, hyperglycemia, hypoglycemia , increased appetite, nausea, paresthesia of the feet, polydipsia, polyuria, visual disturbances, vomiting and weight loss. Last A1C in the office was:  Lab Results  Component Value Date   HGBA1C 5.4 09/30/2019   Patient is on Vitamin D supplement, she can not tolerate Vitamin D supplementation, has tried all forms but they give her a headache.  Lab Results  Component Value Date   VD25OH 19 (L) 09/30/2019       Current Medications:  Current Outpatient Medications on File Prior to Visit  Medication Sig Dispense Refill  . ibuprofen (ADVIL,MOTRIN) 200 MG tablet Take 200 mg by mouth every 6 (six) hours as needed for mild pain.    Marland Kitchen letrozole (FEMARA) 2.5 MG tablet TAKE 1 TABLET BY MOUTH EVERY DAY 90 tablet 3  . LORazepam (ATIVAN) 0.5 MG tablet Take 1 tablet (0.5 mg total) by mouth at bedtime as needed. 30 tablet 1   No current facility-administered medications on file prior to visit.    Health Maintenance:   Immunization History  Administered Date(s) Administered  . Influenza Inj Mdck Quad With Preservative 06/26/2018  . Influenza,inj,Quad PF,6+ Mos 06/25/2017  . Td 08/27/2007  . Tdap 03/16/2018   Tetanus: 2019 Flu vaccine:2018 Pap: July 2018 [facility]  04/2020  DEXA: Dr. Ophelia Charter 2019 AT [facility] Colonoscopy: 2019, [place] Last Eye Exam:  DUE Echo 2018 Dental Exam: DUE   Patient Care Team: [name], MD as PCP - General (Internal Medicine) Minus Breeding, MD as PCP - Cardiology (Cardiology) [name], MD (Internal Medicine) Arta Silence, MD as Consulting Physician (Gastroenterology) Arvella Nigh, MD as Consulting Physician (Obstetrics and Gynecology) Nicholas Lose, MD as Consulting Physician (Hematology and Oncology) Eppie Gibson, MD as Attending Physician (Radiation Oncology) Excell Seltzer, MD (Inactive) as Consulting Physician (General Surgery)  Medical History:  Past Medical History:  Diagnosis Date  . Depression    REMISSION/ SITUATIONAL  . Dyspnea   . Hepatitis AGE 16   RESOLVED  . History of radiation therapy 09/24/17- 10/15/17   Right Breast treated to 42.56 Gy with 16 fx of 2.66 Gy  . Hyperlipidemia   . Insomnia   .  Migraines    HORMONAL  . Personal history of chemotherapy   . Personal history of radiation therapy   . right breast ca dx' d 02/2017  . Shingles outbreak 2003, 2015   RIGHT HIP, and between breasts 3 years ago    Allergies Allergies  Allergen Reactions  . Cholecalciferol Other (See Comments)    Headache  . Citalopram Other (See Comments)    HYPERACTIVITY  . Citalopram     Hyper  . Dexamethasone Other (See Comments)    CHEST TIGHTNESS  . Dexamethasone     Chest tightness  . Dilaudid [Hydromorphone Hcl] Nausea And Vomiting  . Propoxycaine Nausea And Vomiting  . Red Yeast Rice [Cholestin] Other (See Comments)    headache  . Rofecoxib Nausea And Vomiting  . Sulfamethoxazole Swelling    SURGICAL HISTORY She  has a past surgical history that includes Knee arthroscopy (Right, 2002); Tubal ligation; uterine polyps (2016); Breast lumpectomy with radioactive seed and sentinel lymph node biopsy (Right, 03/19/2017); and Breast lumpectomy (Right, 02/2017). FAMILY HISTORY Her family history includes Breast cancer in her sister; Cancer in her brother and paternal  grandfather; Cancer (age of onset: 86) in her sister; Cancer (age of onset: 31) in her father; Cervical cancer in her maternal aunt; Dementia in her maternal aunt and mother; Depression in her brother; Heart disease in her maternal grandfather; Lung cancer in her maternal uncle; Lung cancer (age of onset: 81) in her brother; Other in her paternal grandmother; Stroke (age of onset: 52) in her maternal grandmother. SOCIAL HISTORY She  reports that she quit smoking about 12 years ago. She has never used smokeless tobacco. She reports that she does not drink alcohol and does not use drugs. 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    Review of Systems: Review of Systems  Constitutional: Negative for chills, fever and malaise/fatigue.  HENT: Negative for congestion, ear pain and sore throat.   Eyes: Negative.   Respiratory: Positive for shortness of breath. Negative for cough, hemoptysis, sputum production and wheezing.   Cardiovascular: Negative for chest pain, palpitations, orthopnea, claudication, leg swelling and PND.  Gastrointestinal: Negative for abdominal pain, blood in stool, constipation, diarrhea, heartburn and melena.  Genitourinary: Negative.   Skin: Negative.   Neurological: Negative for dizziness, sensory change, loss of consciousness and headaches.  Psychiatric/Behavioral: Negative for depression, hallucinations, substance abuse and suicidal ideas. The patient is nervous/anxious. The patient does not have insomnia.     Physical Exam: Estimated body mass index is 27.25 kg/m as calculated from the following:   Height as of this encounter: $RemoveBeforeD'5\' 7"'NeZreyrLchAfcb$  (1.702 m).   Weight as of this encounter: 174 lb (78.9 kg). BP 118/70   Pulse 76   Temp 97.6 F (36.4 C)   Ht $R'5\' 7"'XA$  (1.702 m)   Wt 174 lb (78.9 kg)   LMP 08/03/2013   SpO2 99%   BMI 27.25 kg/m   General Appearance: Well nourished well developed, in no apparent distress.  Eyes: PERRLA, EOMs, conjunctiva no swelling or  erythema ENT/Mouth: Ear canals normal without obstruction, swelling, erythema, or discharge.  TMs normal bilaterally with no erythema, bulging, retraction, or loss of landmark.  Oropharynx moist and clear with no exudate, erythema, or swelling.   Neck: Supple, thyroid normal. No bruits.  No cervical adenopathy Respiratory: Respiratory effort normal, + wheezing RML without rhonchi, rales.   Cardio: RRR without murmurs, rubs or gallops. Brisk peripheral pulses without edema.  Chest: symmetric, with normal excursions Abdomen:  Soft, nontender, no guarding, rebound, hernias, masses, or organomegaly.  Lymphatics: Non tender without lymphadenopathy.  Musculoskeletal: Full ROM all peripheral extremities,5/5 strength, and normal gait.  Skin: Warm, dry without rashes, lesions, ecchymosis. Neuro: Awake and oriented X 3, Cranial nerves intact, reflexes equal bilaterally. Normal muscle tone, no cerebellar symptoms. Sensation intact.  Psych:  normal affect, Insight and Judgment appropriate.   EKG: WNL, no ST changes   Garnet Sierras, NP 2:21 PM [facility]

## 2020-10-05 NOTE — Patient Instructions (Signed)
  We are going to send in Meloxicam 15mg  to take once a day to help reduce inflammation.  Take this with food to avoid stomach upset.  Take this for two weeks then stop.   We also send in gabapentin 100mg  to help with nerve pain.  Take one dose of this at bedtime.  It can cause sleepiness.  You can also take this up to three times a day.  Contact office with any new or worsening symptoms.   Next step would be steroid taper and or imaging.   GENERAL HEALTH GOALS  Know what a healthy weight is for you (roughly BMI <25) and aim to maintain this  Aim for 7+ servings of fruits and vegetables daily  70-80+ fluid ounces of water or unsweet tea for healthy kidneys  Limit to max 1 drink of alcohol per day; avoid smoking/tobacco  Limit animal fats in diet for cholesterol and heart health - choose grass fed whenever available  Avoid highly processed foods, and foods high in saturated/trans fats  Aim for low stress - take time to unwind and care for your mental health  Aim for 150 min of moderate intensity exercise weekly for heart health, and weights twice weekly for bone health  Aim for 7-9 hours of sleep daily

## 2020-10-06 LAB — COMPLETE METABOLIC PANEL WITH GFR
AG Ratio: 1.8 (calc) (ref 1.0–2.5)
ALT: 12 U/L (ref 6–29)
AST: 14 U/L (ref 10–35)
Albumin: 4.5 g/dL (ref 3.6–5.1)
Alkaline phosphatase (APISO): 70 U/L (ref 37–153)
BUN: 17 mg/dL (ref 7–25)
CO2: 31 mmol/L (ref 20–32)
Calcium: 9.5 mg/dL (ref 8.6–10.4)
Chloride: 102 mmol/L (ref 98–110)
Creat: 0.88 mg/dL (ref 0.50–0.99)
GFR, Est African American: 80 mL/min/{1.73_m2} (ref >=60)
GFR, Est Non African American: 69 mL/min/{1.73_m2} (ref >=60)
Globulin: 2.5 g/dL (calc) (ref 1.9–3.7)
Glucose, Bld: 81 mg/dL (ref 65–99)
Potassium: 4.4 mmol/L (ref 3.5–5.3)
Sodium: 139 mmol/L (ref 135–146)
Total Bilirubin: 0.9 mg/dL (ref 0.2–1.2)
Total Protein: 7 g/dL (ref 6.1–8.1)

## 2020-10-06 LAB — CBC WITH DIFFERENTIAL/PLATELET
Absolute Monocytes: 526 cells/uL (ref 200–950)
Basophils Absolute: 58 cells/uL (ref 0–200)
Basophils Relative: 0.8 %
Eosinophils Absolute: 72 cells/uL (ref 15–500)
Eosinophils Relative: 1 %
HCT: 39.7 % (ref 35.0–45.0)
Hemoglobin: 13.5 g/dL (ref 11.7–15.5)
Lymphs Abs: 1937 cells/uL (ref 850–3900)
MCH: 31 pg (ref 27.0–33.0)
MCHC: 34 g/dL (ref 32.0–36.0)
MCV: 91.1 fL (ref 80.0–100.0)
MPV: 11.2 fL (ref 7.5–12.5)
Monocytes Relative: 7.3 %
Neutro Abs: 4608 cells/uL (ref 1500–7800)
Neutrophils Relative %: 64 %
Platelets: 231 10*3/uL (ref 140–400)
RBC: 4.36 10*6/uL (ref 3.80–5.10)
RDW: 11.9 % (ref 11.0–15.0)
Total Lymphocyte: 26.9 %
WBC: 7.2 10*3/uL (ref 3.8–10.8)

## 2020-10-06 LAB — URINALYSIS W MICROSCOPIC + REFLEX CULTURE

## 2020-10-06 LAB — LIPID PANEL
Cholesterol: 246 mg/dL — ABNORMAL HIGH (ref <200)
HDL: 67 mg/dL (ref >=50)
LDL Cholesterol (Calc): 160 mg/dL (calc) — ABNORMAL HIGH
Non-HDL Cholesterol (Calc): 179 mg/dL (calc) — ABNORMAL HIGH (ref <130)
Total CHOL/HDL Ratio: 3.7 (calc) (ref <5.0)
Triglycerides: 84 mg/dL (ref <150)

## 2020-10-06 LAB — VITAMIN B12: Vitamin B-12: 379 pg/mL (ref 200–1100)

## 2020-10-06 LAB — HEMOGLOBIN A1C
Hgb A1c MFr Bld: 5.5 % of total Hgb (ref <5.7)
Mean Plasma Glucose: 111 mg/dL
eAG (mmol/L): 6.2 mmol/L

## 2020-10-06 LAB — IRON, TOTAL/TOTAL IRON BINDING CAP
%SAT: 32 % (calc) (ref 16–45)
Iron: 94 ug/dL (ref 45–160)
TIBC: 290 mcg/dL (calc) (ref 250–450)

## 2020-10-06 LAB — VITAMIN D 25 HYDROXY (VIT D DEFICIENCY, FRACTURES): Vit D, 25-Hydroxy: 18 ng/mL — ABNORMAL LOW (ref 30–100)

## 2020-10-06 LAB — NO CULTURE INDICATED

## 2020-10-06 LAB — TSH: TSH: 1.66 mIU/L (ref 0.40–4.50)

## 2020-10-29 ENCOUNTER — Other Ambulatory Visit: Payer: Self-pay | Admitting: Adult Health Nurse Practitioner

## 2020-10-29 DIAGNOSIS — M542 Cervicalgia: Secondary | ICD-10-CM

## 2021-01-24 ENCOUNTER — Other Ambulatory Visit: Payer: Self-pay | Admitting: Adult Health

## 2021-01-24 ENCOUNTER — Telehealth: Payer: Self-pay

## 2021-01-24 NOTE — Telephone Encounter (Signed)
Patient states that she has a possible bug bite or cut from working in the yard on Monday. Very itchy, wanting to know if an  Antibiotic ointment can be sent in?

## 2021-01-24 NOTE — Telephone Encounter (Signed)
Patient is planning on taking a picture and sending it by MyChart.

## 2021-01-25 ENCOUNTER — Ambulatory Visit (INDEPENDENT_AMBULATORY_CARE_PROVIDER_SITE_OTHER): Payer: Medicare Other | Admitting: Adult Health

## 2021-01-25 ENCOUNTER — Encounter: Payer: Self-pay | Admitting: Adult Health

## 2021-01-25 ENCOUNTER — Other Ambulatory Visit: Payer: Self-pay

## 2021-01-25 VITALS — BP 122/78 | HR 71 | Temp 97.3°F | Wt 171.0 lb

## 2021-01-25 DIAGNOSIS — L255 Unspecified contact dermatitis due to plants, except food: Secondary | ICD-10-CM

## 2021-01-25 DIAGNOSIS — L03113 Cellulitis of right upper limb: Secondary | ICD-10-CM

## 2021-01-25 MED ORDER — DOXYCYCLINE HYCLATE 100 MG PO CAPS: ORAL_CAPSULE | ORAL | 0 refills | Status: DC

## 2021-01-25 MED ORDER — PREDNISONE 20 MG PO TABS: ORAL_TABLET | ORAL | 0 refills | Status: DC

## 2021-01-25 NOTE — Progress Notes (Signed)
Assessment and Plan:  Diagnoses and all orders for this visit:  Right arm cellulitis Small laceration with cellulitis, + possible poison sumac superimposed complicating Will start on doxycycline, borders lined, monitor closely, if not improving in 48 hours follow up in UC due to weekend, if any red streaking, fever/chills, joint involvement, rigors, distal neuromuscular involvement present to ED Otherwise follow up in office in not fully resolving with treatment as discussed -     doxycycline (VIBRAMYCIN) 100 MG capsule; Take 1 capsule 2 x/day with food for 10 days.  Dermatitis due to plants, including poison ivy, sumac, and oak  Can use benadryl, calamine lotion, contact precautions discussed.  -     predniSONE (DELTASONE) 20 MG tablet; 2 tablets daily for 3 days, 1 tablet daily for 4 days.  Further disposition pending results of labs. Discussed med's effects and SE's.   Over 15 minutes of exam, counseling, chart review, and critical decision making was performed.   Future Appointments  Date Time Provider [address]  03/02/2021 11:00 AM Nicholas Lose, MD [facility] None  10/08/2021  2:00 PM [place], Danton Sewer, NP [facility] None    ------------------------------------------------------------------------------------------------------------------   HPI 65 y.o.female presents for e valuation of arm wound and suspected poison ivy.   She reports 2 days ago was working in garden, had pain to R forearm and noted 1 cm laceration. Did note note any spider or snake. Cleaned off area and was monitoring, yesterday did try topical antibiotic but work up this AM much worse, redness and swelling extending. She also notes pruritic blistered rash to left leg, possible poison ivy.   She had tetanus boosted in 2019  Past Medical History:  Diagnosis Date  . Depression    REMISSION/ SITUATIONAL  . Dyspnea   . Hepatitis AGE 76   RESOLVED  . History of radiation therapy 09/24/17- 10/15/17   Right  Breast treated to 42.56 Gy with 16 fx of 2.66 Gy  . Hyperlipidemia   . Insomnia   . Migraines    HORMONAL  . Personal history of chemotherapy   . Personal history of radiation therapy   . right breast ca dx' d 02/2017  . Shingles outbreak 2003, 2015   RIGHT HIP, and between breasts 3 years ago     Allergies  Allergen Reactions  . Cholecalciferol Other (See Comments)    Headache  . Citalopram Other (See Comments)    HYPERACTIVITY  . Citalopram     Hyper  . Dexamethasone Other (See Comments)    CHEST TIGHTNESS  . Dexamethasone     Chest tightness  . Dilaudid [Hydromorphone Hcl] Nausea And Vomiting  . Propoxycaine Nausea And Vomiting  . Red Yeast Rice [Cholestin] Other (See Comments)    headache  . Rofecoxib Nausea And Vomiting  . Sulfamethoxazole Swelling    Current Outpatient Medications on File Prior to Visit  Medication Sig  . gabapentin (NEURONTIN) 100 MG capsule Take 1 capsule (100 mg total) by mouth 3 (three) times daily.  Marland Kitchen ibuprofen (ADVIL,MOTRIN) 200 MG tablet Take 200 mg by mouth every 6 (six) hours as needed for mild pain.  Marland Kitchen letrozole (FEMARA) 2.5 MG tablet TAKE 1 TABLET BY MOUTH EVERY DAY  . LORazepam (ATIVAN) 0.5 MG tablet Take 1 tablet (0.5 mg total) by mouth at bedtime as needed.  . meloxicam (MOBIC) 15 MG tablet Take 0.5-1 tablets (7.5-15 mg total) by mouth daily as needed for pain.   No current facility-administered medications on file prior to visit.  ROS: all negative except above.   Physical Exam:  BP 122/78   Pulse 71   Temp (!) 97.3 F (36.3 C)   Wt 171 lb (77.6 kg)   LMP 08/03/2013   SpO2 97%   BMI 26.78 kg/m   General Appearance: Well nourished, in no apparent distress. Eyes: PERRLA, conjunctiva no swelling or erythema ENT/Mouth: Mask in place; Hearing normal.  Neck: Supple Respiratory: Respiratory effort normal Cardio: Appears well perfused. Brisk peripheral pulses without edema.  Musculoskeletal: No apparent deformity, Full ROM,  5/5 strength, normal gait. No joint involvement/effusions.  Skin: See photo for right arm incision and edema/erythema; with blister with viscous serous discharge; she also has left lower leg with scattered blistered lesions with erythematous base.  Neuro:  Normal muscle tone, distact Sensation intact.  Psych: Awake and oriented X 3, normal affect, Insight and Judgment appropriate.         Izora Ribas, NP 8:37 AM [facility]

## 2021-02-09 DIAGNOSIS — H40033 Anatomical narrow angle, bilateral: Secondary | ICD-10-CM | POA: Diagnosis not present

## 2021-02-09 DIAGNOSIS — H43813 Vitreous degeneration, bilateral: Secondary | ICD-10-CM | POA: Diagnosis not present

## 2021-02-09 DIAGNOSIS — H2513 Age-related nuclear cataract, bilateral: Secondary | ICD-10-CM | POA: Diagnosis not present

## 2021-02-16 ENCOUNTER — Telehealth: Payer: Self-pay

## 2021-02-16 ENCOUNTER — Other Ambulatory Visit: Payer: Self-pay | Admitting: Adult Health

## 2021-02-16 DIAGNOSIS — C50411 Malignant neoplasm of upper-outer quadrant of right female breast: Secondary | ICD-10-CM

## 2021-02-16 DIAGNOSIS — Z170 Estrogen receptor positive status [ER+]: Secondary | ICD-10-CM

## 2021-02-16 NOTE — Telephone Encounter (Signed)
Ativan refill request

## 2021-02-16 NOTE — Progress Notes (Signed)
Future Appointments  Date Time Provider [place]  03/02/2021 11:00 AM Nicholas Lose, MD [facility] None  10/08/2021  2:00 PM Garnet Sierras, NP [facility] None   Patient contacted office for ativan refill request. Follow up and PDMP reviewed; appears she has been getting this filled at another office. Will have staff contact her to advise need to request refills from 1 provider only.

## 2021-02-21 ENCOUNTER — Telehealth: Payer: Self-pay | Admitting: Hematology and Oncology

## 2021-02-21 NOTE — Telephone Encounter (Signed)
R/s appt per 6/29 sch msg. Pt aware.  

## 2021-02-22 NOTE — Assessment & Plan Note (Signed)
03/19/2017: Right lumpectomy: IDC with DCIS, 1.2 cm, grade 2, margins negative, 0/2 lymph nodes negative, ER 100%, PR 10%, HER-2 negative ratio 1.73, Ki-67 40% T1BN0 stage IA CHEK 2 Mutation Positive Oncotype Dx score: 41 (28% ROR with Tamoxifen alone) Treatment plan: 1. Adjuvant chemotherapy with dose dense Adriamycin Cytoxan 4 followed by weekly Taxol 12 2. followed by adjuvant radiation1/31/19-2/20/19 3. Followed by adjuvant antiestrogen therapy ----------------------------------------------------------------------------  Current treatment: Letrozole 2.5 mg daily started 11/26/2017 Letrozole toxicities: 1.Depression towards the end of the day: Patient also has a boss who has not been kind towards her 2.Muscle cramps: Instructed her to take letrozole at bedtime  Patient has a grand child coming in July. She is going to [place] to see the baby. She is very excited about that.  Breast cancer surveillance: 1.  Breast exam 02/23/2021: Benign 2. Mammogram: 05/11/2020: Benign breast density category C  Return to clinic in 1 year for follow-up.

## 2021-02-22 NOTE — Progress Notes (Signed)
Patient Care Team: Lucky Cowboy, MD as PCP - General (Internal Medicine) Rollene Rotunda, MD as PCP - Cardiology (Cardiology) Lucky Cowboy, MD (Internal Medicine) Willis Modena, MD as Consulting Physician (Gastroenterology) Richardean Chimera, MD as Consulting Physician (Obstetrics and Gynecology) Serena Croissant, MD as Consulting Physician (Hematology and Oncology) Lonie Peak, MD as Attending Physician (Radiation Oncology) Glenna Fellows, MD (Inactive) as Consulting Physician (General Surgery)  DIAGNOSIS:    ICD-10-CM   1. Carcinoma of upper-outer quadrant of right breast in female, estrogen receptor positive (HCC)  C50.411    Z17.0       SUMMARY OF ONCOLOGIC HISTORY: Oncology History  CHEK2-related breast cancer (HCC)  03/03/2017 Initial Diagnosis   CHEK2-related breast cancer (HCC)    Carcinoma of upper-outer quadrant of right breast in female, estrogen receptor positive (HCC)  02/18/2017 Initial Diagnosis   Right breast 1 cm mass 8 cm from nipple: biopsy 11:30 position: Grade 2 IDC with DCIS ER 100%, PR 10%, Ki-67 40%, HER-2 negative ratio 1.73; right breast 11:30 position 6 cm from nipple 0.7 cm biopsy: Fibrocystic change; T1b N0 stage IA clinical stage    03/09/2017 Genetic Testing   CHEK2 c.349A>G 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  The STAT Breast cancer panel offered by [facility] includes sequencing and rearrangement analysis for the following 9 genes:  ATM, BRCA1, BRCA2, CDH1, CHEK2, PALB2, PTEN, STK11 and TP53.   The report date is March 09, 2017.  Re-requitisioned to the common hereditary cancer panel and the melanoma panel.  The additional genes tested were negative.  The Hereditary Gene Panel offered by [facility] includes sequencing and/or deletion duplication testing of the following 46 genes: APC, ATM, AXIN2, BARD1, BMPR1A, BRCA1, BRCA2, BRIP1, CDH1, CDKN2A (p14ARF), CDKN2A (p16INK4a), CHEK2, CTNNA1, DICER1, EPCAM (Deletion/duplication testing  only), GREM1 (promoter region deletion/duplication testing only), KIT, MEN1, MLH1, MSH2, MSH3, MSH6, MUTYH, NBN, NF1, NHTL1, PALB2, PDGFRA, PMS2, POLD1, POLE, PTEN, RAD50, RAD51C, RAD51D, SDHB, SDHC, SDHD, SMAD4, SMARCA4. STK11, TP53, TSC1, TSC2, and VHL.  The following genes were evaluated for sequence changes only: SDHA and HOXB13 c.251G>A variant only.  The Melanoma panel offered by [facility] includes sequencing and/or deletion duplication testing of the following 12 genes: BAP1, BRCA1, BRCA2, BRIP1, CDK4, CDKN2A (p14ARF), CDKN2A (p16INK4a), MC1R, POT1, PTEN, RB1, TERT, and TP53.  The following gene was evaluated for sequence changes only: MITF (c.952G>A, p.GLU318Lys variant only). The updated report date is March 14, 2017.   UPDATE:CHEK2 c.349A>G has been upgraded from a Likely Pathogenic variant to a Pathogenic variant.  The change in variant classification was made as a result of re-review of the evidence in light of new variant interpretation guidelines and/or new information. The updated report date is March 03, 2020.     03/19/2017 Surgery   Right lumpectomy: IDC with DCIS, 1.2 cm, grade 2, margins negative, 0/2 lymph nodes negative, ER 100%, PR 10%, HER-2 negative ratio 1.73, Ki-67 40% T1BN0 stage IA    04/01/2017 Oncotype testing   Oncotype Dx score: 41 (28% ROR with Tamoxifen alone)    04/16/2017 - 08/27/2017 Chemotherapy   Dose dense Adriamycin and Cytoxan 4 followed by Taxol weekly 12     09/24/2017 - 10/17/2017 Radiation Therapy   Adjuvant radiation    11/28/2017 -  Anti-estrogen oral therapy   Letrozole 2.5 mg daily      CHIEF COMPLIANT: Follow-up of right breast cancer on letrozole therapy  INTERVAL HISTORY: Rebecca [name] is a 65 y.o. with above-mentioned history of  right breast cancer currently  on letrozole therapy. Mammogram on 05/11/20 showed no evidence of malignancy bilaterally. She reports to the clinic today for follow-up.  She complains of leg pains which keep her up at  night.  It is difficult for her to sleep because of the pains.  It is unclear if it is related to letrozole therapy.  She does have chronic mild discomfort in the breast at the site of surgery but otherwise doing quite well.  Hot flashes are not unbearable.  ALLERGIES:  is allergic to cholecalciferol, citalopram, citalopram, dexamethasone, dexamethasone, dilaudid [hydromorphone hcl], propoxycaine, red yeast rice [cholestin], rofecoxib, and sulfamethoxazole.  MEDICATIONS:  Current Outpatient Medications  Medication Sig Dispense Refill   diazepam (VALIUM) 5 MG tablet Take 1 tablet (5 mg total) by mouth at bedtime as needed for anxiety. 30 tablet 3   ibuprofen (ADVIL,MOTRIN) 200 MG tablet Take 200 mg by mouth every 6 (six) hours as needed for mild pain.     letrozole (FEMARA) 2.5 MG tablet TAKE 1 TABLET BY MOUTH EVERY DAY 90 tablet 3   No current facility-administered medications for this visit.    PHYSICAL EXAMINATION: ECOG PERFORMANCE STATUS: 1 - Symptomatic but completely ambulatory  Vitals:   02/23/21 0836  BP: 124/65  Pulse: 68  Resp: 18  Temp: 98.2 F (36.8 C)  SpO2: 99%   Filed Weights   02/23/21 0836  Weight: 168 lb 3.2 oz (76.3 kg)    BREAST: No palpable masses or nodules in either right or left breasts. No palpable axillary supraclavicular or infraclavicular adenopathy no breast tenderness or nipple discharge. (exam performed in the presence of a chaperone)  LABORATORY DATA:  I have reviewed the data as listed CMP Latest Ref Rng & Units 10/05/2020 04/29/2018 10/22/2017  Glucose 65 - 99 mg/dL 81 73 101  BUN 7 - 25 mg/dL $Remove'17 12 12  'YLVIslB$ Creatinine 0.50 - 0.99 mg/dL 0.88 0.75 0.73  Sodium 135 - 146 mmol/L 139 139 140  Potassium 3.5 - 5.3 mmol/L 4.4 3.9 4.1  Chloride 98 - 110 mmol/L 102 102 105  CO2 20 - 32 mmol/L $RemoveB'31 29 26  'zJlJaUSq$ Calcium 8.6 - 10.4 mg/dL 9.5 9.2 9.0  Total Protein 6.1 - 8.1 g/dL 7.0 6.7 6.8  Total Bilirubin 0.2 - 1.2 mg/dL 0.9 0.8 0.8  Alkaline Phos 40 - 150 U/L - -  66  AST 10 - 35 U/L $Remo'14 16 21  'yxXKn$ ALT 6 - 29 U/L $Remo'12 13 25    'ycfZE$ Lab Results  Component Value Date   WBC 7.2 10/05/2020   HGB 13.5 10/05/2020   HCT 39.7 10/05/2020   MCV 91.1 10/05/2020   PLT 231 10/05/2020   NEUTROABS 4,608 10/05/2020    ASSESSMENT & PLAN:  Carcinoma of upper-outer quadrant of right breast in female, estrogen receptor positive ([place]) 03/19/2017: Right lumpectomy: IDC with DCIS, 1.2 cm, grade 2, margins negative, 0/2 lymph nodes negative, ER 100%, PR 10%, HER-2 negative ratio 1.73, Ki-67 40% T1BN0 stage IA CHEK 2 Mutation Positive Oncotype Dx score: 41 (28% ROR with Tamoxifen alone) Treatment plan: 1. Adjuvant chemotherapy with dose dense Adriamycin Cytoxan 4 followed by weekly Taxol 12 2. followed by adjuvant radiation 09/25/17-10/15/17 3. Followed by adjuvant antiestrogen therapy ----------------------------------------------------------------------------   Current treatment: Letrozole 2.5 mg daily started 11/26/2017 Letrozole toxicities: 1.  Depression towards the end of the day: Patient also has a boss who has not been kind towards her 2.  Muscle cramps: Instructed her to take letrozole at bedtime   Patient has a grand child  in [place].   Insomnia issues: I sent a prescription for diazepam.  She will use it sparingly.  Breast cancer surveillance: 1.  Breast exam 02/23/2021: Benign 2. Mammogram: 05/11/2020: Benign breast density category C   Return to clinic in 1 year for follow-up.     No orders of the defined types were placed in this encounter.  The patient has a good understanding of the overall plan. she agrees with it. she will call with any problems that may develop before the next visit here.  Total time spent: 20 mins including face to face time and time spent for planning, charting and coordination of care  Rulon Eisenmenger, MD, MPH 02/23/2021  I, Thana Ates, am acting as scribe for Dr. Nicholas Lose.  I have reviewed the above documentation for  accuracy and completeness, and I agree with the above.

## 2021-02-23 ENCOUNTER — Other Ambulatory Visit: Payer: Self-pay

## 2021-02-23 ENCOUNTER — Inpatient Hospital Stay: Payer: Medicare Other | Attending: Hematology and Oncology | Admitting: Hematology and Oncology

## 2021-02-23 DIAGNOSIS — C50411 Malignant neoplasm of upper-outer quadrant of right female breast: Secondary | ICD-10-CM | POA: Diagnosis not present

## 2021-02-23 DIAGNOSIS — Z79811 Long term (current) use of aromatase inhibitors: Secondary | ICD-10-CM | POA: Insufficient documentation

## 2021-02-23 DIAGNOSIS — Z170 Estrogen receptor positive status [ER+]: Secondary | ICD-10-CM

## 2021-02-23 DIAGNOSIS — N951 Menopausal and female climacteric states: Secondary | ICD-10-CM | POA: Insufficient documentation

## 2021-02-23 MED ORDER — DIAZEPAM 5 MG PO TABS: 5.0000 mg | ORAL_TABLET | Freq: Every evening | ORAL | 3 refills | Status: DC | PRN

## 2021-03-02 ENCOUNTER — Inpatient Hospital Stay: Payer: Self-pay | Admitting: Hematology and Oncology

## 2021-03-13 ENCOUNTER — Ambulatory Visit: Payer: Medicare Other | Admitting: Orthopaedic Surgery

## 2021-03-20 ENCOUNTER — Ambulatory Visit: Payer: Medicare Other | Admitting: Orthopaedic Surgery

## 2021-03-28 ENCOUNTER — Telehealth: Payer: Self-pay

## 2021-03-28 NOTE — Telephone Encounter (Signed)
Patient called due to insomnia issues.   Patient was recently prescribed Valium '5mg'$ .  Patient reports medication makes her extremely drowsy the following day, and is having concerns with being drowsy and having to work.   Ativan 0.'5mg'$  prescribed in the past by MD, and patient reports she feels that medication works better and is requesting to see if medication can be changed.   Will review with MD for approval.

## 2021-03-29 ENCOUNTER — Ambulatory Visit: Payer: Medicare Other | Admitting: Orthopaedic Surgery

## 2021-03-29 ENCOUNTER — Ambulatory Visit: Payer: Self-pay

## 2021-03-29 ENCOUNTER — Other Ambulatory Visit: Payer: Self-pay

## 2021-03-29 ENCOUNTER — Encounter: Payer: Self-pay | Admitting: Orthopaedic Surgery

## 2021-03-29 VITALS — Ht 66.5 in | Wt 170.0 lb

## 2021-03-29 DIAGNOSIS — M542 Cervicalgia: Secondary | ICD-10-CM | POA: Diagnosis not present

## 2021-03-29 DIAGNOSIS — M47812 Spondylosis without myelopathy or radiculopathy, cervical region: Secondary | ICD-10-CM | POA: Diagnosis not present

## 2021-03-29 DIAGNOSIS — M170 Bilateral primary osteoarthritis of knee: Secondary | ICD-10-CM | POA: Insufficient documentation

## 2021-03-29 DIAGNOSIS — M25562 Pain in left knee: Secondary | ICD-10-CM

## 2021-03-29 DIAGNOSIS — G8929 Other chronic pain: Secondary | ICD-10-CM

## 2021-03-29 MED ORDER — LORAZEPAM 0.5 MG PO TABS: ORAL_TABLET | ORAL | 0 refills | Status: DC

## 2021-03-29 NOTE — Progress Notes (Signed)
Office Visit Note   Patient: Rebecca [name]           Date of Birth: Jul 26, 1956           MRN: [id number] Visit Date: 03/29/2021              Requested by: [name], [address],  [place] [id number] PCP: [name], MD   Assessment & Plan: Visit Diagnoses:  1. Chronic pain of left knee   2. Neck pain   3. Primary osteoarthritis of both knees   4. Spondylosis of cervical region without myelopathy or radiculopathy     Plan: Rebecca [name] has mild osteoarthritis of both of her knees.  She is a bit more symptomatic on the left than the right.  Presently she is not having any pain the last weeks had some aches and pains and a little bit of swelling.  She has normal range of motion and no instability.  I reviewed the x-rays with her and explained the different treatment options including over-the-counter medicines and exercises and even occasional cortisone injection if she has any problem.  Also has osteoarthritis of the cervical spine.  On the x-rays there is straightening of the normal cervical lordosis and disc narrowing between C3 and C7.  There is slight anterior listhesis of C7 on T1.  Also discussed that at length and what she may expect over time.  She will download neck exercises.  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  There is no evidence of radiculopathy  Follow-Up Instructions: Return if symptoms worsen or fail to improve.   Orders:  Orders Placed This Encounter  Procedures   XR Cervical Spine 2 or 3 views   XR KNEE 3 VIEW LEFT   No orders of the defined types were placed in this encounter.     Procedures: No procedures performed   Clinical Data: No additional findings.   Subjective: Chief Complaint  Patient presents with   Left Knee - Pain   Neck - Pain  Patient presents today for left knee pain. She said that it has been hurting for two months. No known injury. She states that the pain is never  in any certain location of her knee. It is swollen and does give way. No previous knee surgery on the left side. She takes Advil as needed. Patient also has complaints of neck pain. She said that when she lifts anything for does laundry she has neck pain and a headache. No pain down either arm. She does occasionally experience numbness in her hands. 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 No previous neck surgery.  HPI  Review of Systems   Objective: Vital Signs: Ht 5' 6.5" (1.689 m)   Wt 170 lb (77.1 kg)   LMP 08/03/2013   BMI 27.03 kg/m   Physical Exam Constitutional:      Appearance: She is well-developed.  Eyes:     Pupils: Pupils are equal, round, and reactive to light.  Pulmonary:     Effort: Pulmonary effort is normal.  Skin:    General: Skin is warm and dry.  Neurological:     Mental Status: She is alert and oriented to person, place, and time.  Psychiatric:        Behavior: Behavior normal.    Ortho Exam awake alert and oriented x3.  Comfortable sitting.  Left knee with full flexion and extension compared to the right knee  i.e. flexion about 105 degrees.  No instability.  No joint line tenderness today.  Mild patella crepitation but no pain with patella compression.  No popliteal pain or mass.  No calf pain.  Neurologically intact.  Straight leg raise negative painless range of motion both hips.  Essentially full range of motion of the cervical spine without crepitation or referred pain to either upper extremity.  Reflexes were depressed bilaterally but symmetrical.  Good strength.  Sensory exam intact.  Specialty Comments:  No specialty comments available.  Imaging: XR Cervical Spine 2 or 3 views  Result Date: 03/29/2021 Films of the cervical spine were obtained in 2 projections.  There is diffuse degenerative change from C3-C7 and slight anterior listhesis of C7 on T1.  Facet sclerosis at those levels also identified.  There is some straightening  of the normal cervical lordosis.  No other bony abnormalities identified.  Films are consistent with primary osteoarthritis  XR KNEE 3 VIEW LEFT  Result Date: 03/29/2021 Films of the left knee are obtained in 3 projections standing.  There is mild narrowing of the medial joint space and about 1 degree of varus no ectopic calcification or acute changes.  Some mild osteoarthritic changes about the patellofemoral joint.  Films are consistent with osteoarthritis.  Included in the films are standing AP of the right knee that shows a little bit more narrowing of the medial joint space than on the left    PMFS History: Patient Active Problem List   Diagnosis Date Noted   Osteoarthritis of both knees 03/29/2021   Osteoarthritis cervical spine 03/29/2021   Right arm cellulitis 01/25/2021   DOE (dyspnea on exertion) 12/02/2019   Educated about COVID-19 virus infection 12/02/2019   Port catheter in place 04/23/2017   Genetic testing 03/10/2017   Carcinoma of upper-outer quadrant of right breast in female, estrogen receptor positive ([place]) 03/05/2017   CHEK2-related breast cancer ([place]) 03/03/2017   Family history of breast cancer    Family history of melanoma    Family history of colon cancer    Family history of brain cancer    Migraine 08/24/2015   Hyperlipidemia 07/02/2013   Vitamin D deficiency 07/02/2013   Past Medical History:  Diagnosis Date   Depression    REMISSION/ SITUATIONAL   Dyspnea    Hepatitis AGE 47   RESOLVED   History of radiation therapy 09/24/17- 10/15/17   Right Breast treated to 42.56 Gy with 16 fx of 2.66 Gy   Hyperlipidemia    Insomnia    Migraines    HORMONAL   Personal history of chemotherapy    Personal history of radiation therapy    right breast ca dx' d 02/2017   Shingles outbreak 2003, 2015   RIGHT HIP, and between breasts 3 years ago    Family History  Problem Relation Age of Onset   Depression Brother    Lung cancer Brother 58       maternal half  brother   Dementia Mother        ALZHEIMERS   Cancer Father 38       MELANOMA CAUSED DEATH/melanoma   Cancer Brother        multiple myeloma/lung - maternal half brother   Stroke Maternal Grandmother 74   Cancer Sister 75       breast- deceased at 29 - 1/2 sister   Breast cancer Sister    Heart disease Maternal Grandfather    Cancer Paternal Grandfather  COLON   Cervical cancer Maternal Aunt    Lung cancer Maternal Uncle    Other Paternal Grandmother        died in childbirth   Dementia Maternal Aunt     Past Surgical History:  Procedure Laterality Date   BREAST LUMPECTOMY Right 02/2017   BREAST LUMPECTOMY WITH RADIOACTIVE SEED AND SENTINEL LYMPH NODE BIOPSY Right 03/19/2017   Procedure: RIGHT BREAST LUMPECTOMY WITH RADIOACTIVE SEED AND SENTINEL LYMPH NODE BIOPSY;  Surgeon: Excell Seltzer, MD;  Location: [facility];  Service: General;  Laterality: Right;   KNEE ARTHROSCOPY Right 2002   TUBAL LIGATION     uterine polyps  2016   Social History   Occupational History   Not on file  Tobacco Use   Smoking status: Former    Types: Cigarettes    Quit date: 08/26/2008    Years since quitting: 12.5   Smokeless tobacco: Never  Substance and Sexual Activity   Alcohol use: No   Drug use: No   Sexual activity: Yes    Birth control/protection: None

## 2021-03-29 NOTE — Telephone Encounter (Signed)
MD approved medication to be changed to Ativan 0.'5mg'$  tablet at bedtime as needed.    Rx phone into pharmacy:  Ativan 0.'5mg'$  tablet   Take 1 tablet at bedtime, as needed.  Dis #30, 0 Refills.    RN notified pharmacy to cancel Valium refills available due to change in medication.   Pt notified.

## 2021-05-03 ENCOUNTER — Other Ambulatory Visit: Payer: Self-pay | Admitting: Hematology and Oncology

## 2021-05-03 DIAGNOSIS — Z9889 Other specified postprocedural states: Secondary | ICD-10-CM

## 2021-05-21 DIAGNOSIS — Z20822 Contact with and (suspected) exposure to covid-19: Secondary | ICD-10-CM | POA: Diagnosis not present

## 2021-05-22 ENCOUNTER — Ambulatory Visit (INDEPENDENT_AMBULATORY_CARE_PROVIDER_SITE_OTHER): Payer: Medicare Other | Admitting: Adult Health

## 2021-05-22 ENCOUNTER — Encounter: Payer: Self-pay | Admitting: Adult Health

## 2021-05-22 ENCOUNTER — Other Ambulatory Visit: Payer: Self-pay

## 2021-05-22 VITALS — BP 130/80 | HR 75 | Temp 97.3°F | Wt 175.0 lb

## 2021-05-22 DIAGNOSIS — J069 Acute upper respiratory infection, unspecified: Secondary | ICD-10-CM

## 2021-05-22 MED ORDER — PROMETHAZINE-DM 6.25-15 MG/5ML PO SYRP: 5.0000 mL | ORAL_SOLUTION | Freq: Four times a day (QID) | ORAL | 1 refills | Status: DC | PRN

## 2021-05-22 NOTE — Progress Notes (Signed)
Assessment and Plan:  Rebecca [name] was seen today for acute visit.  Diagnoses and all orders for this visit:  Viral URI Benign exam with course suggestive of viral URI Discussed the importance of avoiding unnecessary antibiotic therapy. Typical  Suggested symptomatic OTC remedies. Nasal saline spray and flonase for congestion. If persistent 3 more days try allergy pill  Follow up as needed if not improving as expected or with new concerning sx.  -     promethazine-dextromethorphan (PROMETHAZINE-DM) 6.25-15 MG/5ML syrup; Take 5 mLs by mouth 4 (four) times daily as needed for cough.  Discussed med's effects and SE's.   Over 15 minutes of exam, counseling, chart review, and critical decision making was performed.   Future Appointments  Date Time Provider [place]  05/31/2021  3:40 PM [facility]  10/08/2021  2:00 PM Magda Bernheim, NP [facility] None  03/01/2022  8:15 AM Nicholas Lose, MD [facility] None    ------------------------------------------------------------------------------------------------------------------   HPI BP 130/80   Pulse 75   Temp (!) 97.3 F (36.3 C)   Wt 175 lb (79.4 kg)   LMP 08/03/2013   SpO2 99%   BMI 27.82 kg/m  65 y.o.female presents for evaluation of URI. Negative for rapid covid 19 and [facility] reported by patient since onset of sx.   She reports sx began this past weekend, 5 days ago, was on ferry coming back from beach after girl's trip, had mild sore throat, a bit worse the next day. She reports has developed nasal congestion, running nose (clear), post-nasal drip, fullness in both ears. Also mild cough, feels like irritation from throat. Has been taking robitussin with benefit.   She denies HA other than with cough, sinus tenderness, fever/chills. Sore throat is mild. Took allergy pill to help sleep last night, feeling improved today.   No known sick contacts.   Breast cancer on femara.    Denies hx of fall/summer allergies.   Past Medical History:  Diagnosis Date   Depression    REMISSION/ SITUATIONAL   Dyspnea    Hepatitis AGE 53   RESOLVED   History of radiation therapy 09/24/17- 10/15/17   Right Breast treated to 42.56 Gy with 16 fx of 2.66 Gy   Hyperlipidemia    Insomnia    Migraines    HORMONAL   Personal history of chemotherapy    Personal history of radiation therapy    right breast ca dx' d 02/2017   Shingles outbreak 2003, 2015   RIGHT HIP, and between breasts 3 years ago     Allergies  Allergen Reactions   Cholecalciferol Other (See Comments)    Headache   Citalopram Other (See Comments)    HYPERACTIVITY   Citalopram     Hyper   Dexamethasone Other (See Comments)    CHEST TIGHTNESS   Dexamethasone     Chest tightness   Dilaudid [Hydromorphone Hcl] Nausea And Vomiting   Propoxycaine Nausea And Vomiting   Red Yeast Rice [Cholestin] Other (See Comments)    headache   Rofecoxib Nausea And Vomiting   Sulfamethoxazole Swelling    Current Outpatient Medications on File Prior to Visit  Medication Sig   ibuprofen (ADVIL,MOTRIN) 200 MG tablet Take 200 mg by mouth every 6 (six) hours as needed for mild pain.   letrozole (FEMARA) 2.5 MG tablet TAKE 1 TABLET BY MOUTH EVERY DAY   LORazepam (ATIVAN) 0.5 MG tablet Take 1 tablet PO 0.5mg  tablet at bedtime, as needed.  No current facility-administered medications on file prior to visit.   Allergies:  Allergies  Allergen Reactions   Cholecalciferol Other (See Comments)    Headache   Citalopram Other (See Comments)    HYPERACTIVITY   Citalopram     Hyper   Dexamethasone Other (See Comments)    CHEST TIGHTNESS   Dexamethasone     Chest tightness   Dilaudid [Hydromorphone Hcl] Nausea And Vomiting   Propoxycaine Nausea And Vomiting   Red Yeast Rice [Cholestin] Other (See Comments)    headache   Rofecoxib Nausea And Vomiting   Sulfamethoxazole Swelling   Social History:   reports that she  quit smoking about 12 years ago. Her smoking use included cigarettes. She has never used smokeless tobacco. She reports that she does not drink alcohol and does not use drugs.  ROS: all negative except above.   Physical Exam:  BP 130/80   Pulse 75   Temp (!) 97.3 F (36.3 C)   Wt 175 lb (79.4 kg)   LMP 08/03/2013   SpO2 99%   BMI 27.82 kg/m   General Appearance: Well nourished, in no apparent distress. Eyes: PERRLA, conjunctiva no swelling or erythema Sinuses: No Frontal/maxillary tenderness ENT/Mouth: Ext aud canals clear, TMs without erythema, bulging. No erythema, swelling, or exudate on post pharynx.  Tonsils not swollen or erythematous. Hearing normal.  Neck: Supple Respiratory: Respiratory effort normal, BS equal bilaterally without rales, rhonchi, wheezing or stridor.  Cardio: RRR with no MRGs. Brisk peripheral pulses without edema.  Abdomen: Soft, + BS.  Non tender Lymphatics: Non tender without lymphadenopathy.  Musculoskeletal: normal gait.  Skin: Warm, dry without rashes, lesions, ecchymosis.  Neuro: Normal muscle tone Psych: Awake and oriented X 3, normal affect, Insight and Judgment appropriate.     Izora Ribas, NP 3:28 PM [facility]

## 2021-05-29 ENCOUNTER — Telehealth: Payer: Self-pay

## 2021-05-29 NOTE — Telephone Encounter (Signed)
Rebecca [name] saw her last week and told her to call back if she wasn't feeling better in a week. She still has a cough and runny nose.

## 2021-05-29 NOTE — Telephone Encounter (Signed)
Pt needs appointment can she come at 4:15

## 2021-05-31 ENCOUNTER — Ambulatory Visit
Admission: RE | Admit: 2021-05-31 | Discharge: 2021-05-31 | Disposition: A | Discharge status: Home / Self Care | Payer: Medicare Other | Source: Ambulatory Visit | Attending: Hematology and Oncology | Admitting: Hematology and Oncology

## 2021-05-31 ENCOUNTER — Other Ambulatory Visit: Payer: Self-pay | Admitting: Hematology and Oncology

## 2021-05-31 ENCOUNTER — Other Ambulatory Visit: Payer: Self-pay

## 2021-05-31 DIAGNOSIS — Z1231 Encounter for screening mammogram for malignant neoplasm of breast: Secondary | ICD-10-CM | POA: Diagnosis not present

## 2021-05-31 DIAGNOSIS — Z9889 Other specified postprocedural states: Secondary | ICD-10-CM

## 2021-06-12 ENCOUNTER — Other Ambulatory Visit: Payer: Self-pay | Admitting: Hematology and Oncology

## 2021-06-13 NOTE — Telephone Encounter (Signed)
Pt has anxiety with work and trouble sleeping at night, you have ordered for her to use on a PRN basis

## 2021-06-15 ENCOUNTER — Telehealth: Payer: Self-pay

## 2021-06-15 NOTE — Telephone Encounter (Signed)
Patient notified of Prior Authorization approval for Lorazepam 0.5mg  Tablets. Medication is approved from 06/13/2021 through 06/13/2022. Patient's preferred Pharmacy notified.

## 2021-07-17 DIAGNOSIS — D7389 Other diseases of spleen: Secondary | ICD-10-CM | POA: Diagnosis not present

## 2021-07-17 DIAGNOSIS — N202 Calculus of kidney with calculus of ureter: Secondary | ICD-10-CM | POA: Diagnosis not present

## 2021-07-17 DIAGNOSIS — N200 Calculus of kidney: Secondary | ICD-10-CM | POA: Diagnosis not present

## 2021-07-17 DIAGNOSIS — Z853 Personal history of malignant neoplasm of breast: Secondary | ICD-10-CM | POA: Diagnosis not present

## 2021-07-26 ENCOUNTER — Other Ambulatory Visit: Payer: Self-pay | Admitting: Urology

## 2021-08-02 ENCOUNTER — Encounter: Payer: Self-pay | Admitting: Internal Medicine

## 2021-08-24 ENCOUNTER — Other Ambulatory Visit: Payer: Self-pay

## 2021-08-24 ENCOUNTER — Encounter (HOSPITAL_BASED_OUTPATIENT_CLINIC_OR_DEPARTMENT_OTHER): Payer: Self-pay | Admitting: Urology

## 2021-08-24 NOTE — Progress Notes (Signed)
Spoke w/ via phone for pre-op interview--- [place]----   ISTAT (GENT)            Lab results------ COVID test -----patient states asymptomatic no test needed Arrive at -------0700 NPO after MN NO Solid Food.  Clear liquids from MN until--- Med rec completed Medications to take morning of surgery -----NONE Diabetic medication ----- Patient instructed no nail polish to be worn day of surgery Patient instructed to bring photo id and insurance card day of surgery Patient aware to have Driver (ride ) / caregiver  Rebecca [name] (husband)   for 24 hours after surgery  Patient Special Instructions ----- Pre-Op special Istructions ----- Patient verbalized understanding of instructions that were given at this phone interview. Patient denies shortness of breath, chest pain, fever, cough at this phone interview.

## 2021-08-30 NOTE — Anesthesia Preprocedure Evaluation (Addendum)
Anesthesia Evaluation  Patient identified by MRN, date of birth, ID band Patient awake    Reviewed: Allergy & Precautions, NPO status , Patient's Chart, lab work & pertinent test results  History of Anesthesia Complications Negative for: history of anesthetic complications  Airway Mallampati: III  TM Distance: >3 FB Neck ROM: Full    Dental  (+) Missing,    Pulmonary former smoker,    Pulmonary exam normal        Cardiovascular negative cardio ROS Normal cardiovascular exam     Neuro/Psych  Headaches, Depression    GI/Hepatic negative GI ROS, Neg liver ROS,   Endo/Other  negative endocrine ROS  Renal/GU negative Renal ROS  negative genitourinary   Musculoskeletal  (+) Arthritis , Osteoarthritis,    Abdominal   Peds  Hematology negative hematology ROS (+)   Anesthesia Other Findings Hx of right breast ca 2018  Reproductive/Obstetrics negative OB ROS                           Anesthesia Physical Anesthesia Plan  ASA: 2  Anesthesia Plan: General   Post-op Pain Management: Tylenol PO (pre-op)   Induction: Intravenous  PONV Risk Score and Plan: 3 and Ondansetron, Midazolam, Treatment may vary due to age or medical condition and Droperidol  Airway Management Planned: LMA  Additional Equipment: None  Intra-op Plan:   Post-operative Plan: Extubation in OR  Informed Consent: I have reviewed the patients History and Physical, chart, labs and discussed the procedure including the risks, benefits and alternatives for the proposed anesthesia with the patient or authorized representative who has indicated his/her understanding and acceptance.     Dental advisory given  Plan Discussed with: CRNA  Anesthesia Plan Comments:        Anesthesia Quick Evaluation

## 2021-08-31 ENCOUNTER — Ambulatory Visit (HOSPITAL_BASED_OUTPATIENT_CLINIC_OR_DEPARTMENT_OTHER)
Admission: RE | Admit: 2021-08-31 | Discharge: 2021-08-31 | Disposition: A | Discharge status: Home / Self Care | Payer: Medicare Other | Attending: Urology | Admitting: Urology

## 2021-08-31 ENCOUNTER — Other Ambulatory Visit: Payer: Self-pay

## 2021-08-31 ENCOUNTER — Encounter (HOSPITAL_BASED_OUTPATIENT_CLINIC_OR_DEPARTMENT_OTHER)
Admission: RE | Disposition: A | Discharge status: Home / Self Care | Payer: Self-pay | Source: Home / Self Care | Attending: Urology

## 2021-08-31 ENCOUNTER — Ambulatory Visit (HOSPITAL_BASED_OUTPATIENT_CLINIC_OR_DEPARTMENT_OTHER): Payer: Medicare Other | Admitting: Anesthesiology

## 2021-08-31 ENCOUNTER — Encounter (HOSPITAL_BASED_OUTPATIENT_CLINIC_OR_DEPARTMENT_OTHER): Payer: Self-pay | Admitting: Urology

## 2021-08-31 DIAGNOSIS — Z853 Personal history of malignant neoplasm of breast: Secondary | ICD-10-CM | POA: Insufficient documentation

## 2021-08-31 DIAGNOSIS — M1990 Unspecified osteoarthritis, unspecified site: Secondary | ICD-10-CM | POA: Diagnosis not present

## 2021-08-31 DIAGNOSIS — F32A Depression, unspecified: Secondary | ICD-10-CM | POA: Diagnosis not present

## 2021-08-31 DIAGNOSIS — Z87891 Personal history of nicotine dependence: Secondary | ICD-10-CM | POA: Diagnosis not present

## 2021-08-31 DIAGNOSIS — R519 Headache, unspecified: Secondary | ICD-10-CM | POA: Diagnosis not present

## 2021-08-31 DIAGNOSIS — N200 Calculus of kidney: Secondary | ICD-10-CM | POA: Diagnosis not present

## 2021-08-31 DIAGNOSIS — Z87442 Personal history of urinary calculi: Secondary | ICD-10-CM | POA: Diagnosis not present

## 2021-08-31 DIAGNOSIS — M545 Low back pain, unspecified: Secondary | ICD-10-CM | POA: Diagnosis not present

## 2021-08-31 HISTORY — PX: CYSTOSCOPY WITH RETROGRADE PYELOGRAM, URETEROSCOPY AND STENT PLACEMENT: SHX5789

## 2021-08-31 HISTORY — PX: HOLMIUM LASER APPLICATION: SHX5852

## 2021-08-31 HISTORY — DX: Personal history of urinary calculi: Z87.442

## 2021-08-31 HISTORY — DX: Other specified postprocedural states: Z98.890

## 2021-08-31 HISTORY — DX: Nausea with vomiting, unspecified: R11.2

## 2021-08-31 LAB — POCT I-STAT, CHEM 8
BUN: 16 mg/dL (ref 8–23)
Calcium, Ion: 1.23 mmol/L (ref 1.15–1.40)
Chloride: 104 mmol/L (ref 98–111)
Creatinine, Ser: 0.6 mg/dL (ref 0.44–1.00)
Glucose, Bld: 95 mg/dL (ref 70–99)
HCT: 41 % (ref 36.0–46.0)
Hemoglobin: 13.9 g/dL (ref 12.0–15.0)
Potassium: 4.1 mmol/L (ref 3.5–5.1)
Sodium: 141 mmol/L (ref 135–145)
TCO2: 25 mmol/L (ref 22–32)

## 2021-08-31 SURGERY — CYSTOURETEROSCOPY, WITH RETROGRADE PYELOGRAM AND STENT INSERTION
Anesthesia: General | Site: Renal | Laterality: Bilateral

## 2021-08-31 MED ORDER — OXYCODONE HCL 5 MG/5ML PO SOLN: 5.0000 mg | Freq: Once | ORAL | Status: DC | PRN

## 2021-08-31 MED ORDER — ONDANSETRON HCL 4 MG/2ML IJ SOLN
INTRAMUSCULAR | Status: DC | PRN
Start: 2021-08-31 — End: 2021-08-31
  Administered 2021-08-31: 4 mg via INTRAVENOUS

## 2021-08-31 MED ORDER — PROPOFOL 10 MG/ML IV BOLUS
INTRAVENOUS | Status: AC
  Filled 2021-08-31: qty 20

## 2021-08-31 MED ORDER — FENTANYL CITRATE (PF) 100 MCG/2ML IJ SOLN
INTRAMUSCULAR | Status: AC
  Filled 2021-08-31: qty 2

## 2021-08-31 MED ORDER — DROPERIDOL 2.5 MG/ML IJ SOLN
INTRAMUSCULAR | Status: DC | PRN
  Administered 2021-08-31: .625 mg via INTRAVENOUS

## 2021-08-31 MED ORDER — PROPOFOL 10 MG/ML IV BOLUS
INTRAVENOUS | Status: DC | PRN
  Administered 2021-08-31: 160 mg via INTRAVENOUS

## 2021-08-31 MED ORDER — IOHEXOL 300 MG/ML  SOLN
INTRAMUSCULAR | Status: DC | PRN
  Administered 2021-08-31: 14 mL

## 2021-08-31 MED ORDER — CEPHALEXIN 500 MG PO CAPS: 500.0000 mg | ORAL_CAPSULE | Freq: Two times a day (BID) | ORAL | 0 refills | Status: AC

## 2021-08-31 MED ORDER — FENTANYL CITRATE (PF) 100 MCG/2ML IJ SOLN
25.0000 ug | INTRAMUSCULAR | Status: DC | PRN
  Administered 2021-08-31 (×2): 50 ug via INTRAVENOUS

## 2021-08-31 MED ORDER — EPHEDRINE SULFATE-NACL 50-0.9 MG/10ML-% IV SOSY
PREFILLED_SYRINGE | INTRAVENOUS | Status: DC | PRN
  Administered 2021-08-31 (×2): 10 mg via INTRAVENOUS

## 2021-08-31 MED ORDER — FENTANYL CITRATE (PF) 100 MCG/2ML IJ SOLN
INTRAMUSCULAR | Status: DC | PRN
  Administered 2021-08-31 (×2): 25 ug via INTRAVENOUS
  Administered 2021-08-31: 50 ug via INTRAVENOUS

## 2021-08-31 MED ORDER — DROPERIDOL 2.5 MG/ML IJ SOLN
INTRAMUSCULAR | Status: AC
  Filled 2021-08-31: qty 2

## 2021-08-31 MED ORDER — MIDAZOLAM HCL 5 MG/5ML IJ SOLN
INTRAMUSCULAR | Status: DC | PRN
  Administered 2021-08-31: 2 mg via INTRAVENOUS

## 2021-08-31 MED ORDER — LACTATED RINGERS IV SOLN: INTRAVENOUS | Status: DC

## 2021-08-31 MED ORDER — ACETAMINOPHEN 500 MG PO TABS
1000.0000 mg | ORAL_TABLET | Freq: Once | ORAL | Status: AC
  Administered 2021-08-31: 1000 mg via ORAL

## 2021-08-31 MED ORDER — KETOROLAC TROMETHAMINE 10 MG PO TABS: 10.0000 mg | ORAL_TABLET | Freq: Three times a day (TID) | ORAL | 0 refills | Status: DC | PRN

## 2021-08-31 MED ORDER — MIDAZOLAM HCL 2 MG/2ML IJ SOLN
INTRAMUSCULAR | Status: AC
  Filled 2021-08-31: qty 2

## 2021-08-31 MED ORDER — OXYCODONE-ACETAMINOPHEN 5-325 MG PO TABS
1.0000 | ORAL_TABLET | Freq: Four times a day (QID) | ORAL | 0 refills | Status: DC | PRN
Start: 2021-08-31 — End: 2021-11-01

## 2021-08-31 MED ORDER — LIDOCAINE 2% (20 MG/ML) 5 ML SYRINGE
INTRAMUSCULAR | Status: AC
  Filled 2021-08-31: qty 5

## 2021-08-31 MED ORDER — LIDOCAINE 2% (20 MG/ML) 5 ML SYRINGE
INTRAMUSCULAR | Status: DC | PRN
  Administered 2021-08-31: 100 mg via INTRAVENOUS

## 2021-08-31 MED ORDER — GENTAMICIN SULFATE 40 MG/ML IJ SOLN
5.0000 mg/kg | INTRAVENOUS | Status: AC
  Administered 2021-08-31: 340 mg via INTRAVENOUS
  Filled 2021-08-31: qty 8.5

## 2021-08-31 MED ORDER — ONDANSETRON HCL 4 MG/2ML IJ SOLN
INTRAMUSCULAR | Status: AC
  Filled 2021-08-31: qty 2

## 2021-08-31 MED ORDER — DEXAMETHASONE SODIUM PHOSPHATE 10 MG/ML IJ SOLN
INTRAMUSCULAR | Status: AC
  Filled 2021-08-31: qty 1

## 2021-08-31 MED ORDER — ACETAMINOPHEN 500 MG PO TABS
ORAL_TABLET | ORAL | Status: AC
  Filled 2021-08-31: qty 2

## 2021-08-31 MED ORDER — AMISULPRIDE (ANTIEMETIC) 5 MG/2ML IV SOLN: 10.0000 mg | Freq: Once | INTRAVENOUS | Status: DC | PRN

## 2021-08-31 MED ORDER — 0.9 % SODIUM CHLORIDE (POUR BTL) OPTIME
TOPICAL | Status: DC | PRN
Start: 2021-08-31 — End: 2021-08-31
  Administered 2021-08-31: 500 mL

## 2021-08-31 MED ORDER — SODIUM CHLORIDE 0.9 % IR SOLN
Status: DC | PRN
  Administered 2021-08-31: 3000 mL

## 2021-08-31 MED ORDER — OXYCODONE HCL 5 MG PO TABS: 5.0000 mg | ORAL_TABLET | Freq: Once | ORAL | Status: DC | PRN

## 2021-08-31 SURGICAL SUPPLY — 29 items
BAG DRAIN URO-CYSTO SKYTR STRL (DRAIN) ×2 IMPLANT
BAG DRN UROCATH (DRAIN) ×1
BASKET LASER NITINOL 1.9FR (BASKET) ×1 IMPLANT
BSKT STON RTRVL 120 1.9FR (BASKET) ×1
CATH INTERMIT  6FR 70CM (CATHETERS) ×1 IMPLANT
CLOTH BEACON ORANGE TIMEOUT ST (SAFETY) ×2 IMPLANT
COVER DOME SNAP 22 D (MISCELLANEOUS) ×1 IMPLANT
FIBER LASER FLEXIVA 365 (UROLOGICAL SUPPLIES) IMPLANT
GLOVE SURG ENC MOIS LTX SZ7.5 (GLOVE) ×2 IMPLANT
GLOVE SURG UNDER POLY LF SZ7 (GLOVE) ×2 IMPLANT
GOWN STRL REUS W/ TWL LRG LVL3 (GOWN DISPOSABLE) IMPLANT
GOWN STRL REUS W/TWL LRG LVL3 (GOWN DISPOSABLE) ×7 IMPLANT
GUIDEWIRE ANG ZIPWIRE 038X150 (WIRE) ×3 IMPLANT
GUIDEWIRE STR DUAL SENSOR (WIRE) ×3 IMPLANT
IV NS 1000ML (IV SOLUTION)
IV NS 1000ML BAXH (IV SOLUTION) ×1 IMPLANT
IV NS IRRIG 3000ML ARTHROMATIC (IV SOLUTION) ×2 IMPLANT
KIT TURNOVER CYSTO (KITS) ×2 IMPLANT
MANIFOLD NEPTUNE II (INSTRUMENTS) ×2 IMPLANT
NS IRRIG 500ML POUR BTL (IV SOLUTION) ×2 IMPLANT
PACK CYSTO (CUSTOM PROCEDURE TRAY) ×2 IMPLANT
SHEATH URETERAL 12FRX28CM (UROLOGICAL SUPPLIES) ×1 IMPLANT
STENT POLARIS 5FRX22 (STENTS) ×2 IMPLANT
SYR 10ML LL (SYRINGE) ×2 IMPLANT
TRACTIP FLEXIVA PULS ID 200XHI (Laser) IMPLANT
TRACTIP FLEXIVA PULSE ID 200 (Laser) ×2
TUBE CONNECTING 12X1/4 (SUCTIONS) ×2 IMPLANT
TUBE FEEDING 8FR 16IN STR KANG (MISCELLANEOUS) ×1 IMPLANT
TUBING UROLOGY SET (TUBING) ×2 IMPLANT

## 2021-08-31 NOTE — Discharge Instructions (Addendum)
1 - You may have urinary urgency (bladder spasms) and bloody urine on / off with stent in place. This is normal.  2 - Call MD or go to ER for fever >102, severe pain / nausea / vomiting not relieved by medications, or acute change in medical status  Post Anesthesia Home Care Instructions  Activity: Get plenty of rest for the remainder of the day. A responsible individual must stay with you for 24 hours following the procedure.  For the next 24 hours, DO NOT: -Drive a car -Paediatric nurse -Drink alcoholic beverages -Take any medication unless instructed by your physician -Make any legal decisions or sign important papers.  Meals: Start with liquid foods such as gelatin or soup. Progress to regular foods as tolerated. Avoid greasy, spicy, heavy foods. If nausea and/or vomiting occur, drink only clear liquids until the nausea and/or vomiting subsides. Call your physician if vomiting continues.  Special Instructions/Symptoms: Your throat may feel dry or sore from the anesthesia or the breathing tube placed in your throat during surgery. If this causes discomfort, gargle with warm salt water. The discomfort should disappear within 24 hours.  [facility] [phone] Post Ureteroscopy With Stent Instructions  Definitions:  Ureter: The duct that transports urine from the kidney to the bladder. Stent:   A plastic hollow tube that is placed into the ureter, from the kidney to the bladder to prevent the ureter from swelling shut.  GENERAL INSTRUCTIONS:  Despite the fact that no skin incisions were used, the area around the ureter and bladder is raw and irritated. The stent is a foreign body which will further irritate the bladder wall. This irritation is manifested by increased frequency of urination, both day and night, and by an increase in the urge to urinate. In some, the urge to urinate is present almost always. Sometimes the urge is strong enough that you may not be  able to stop yourself from urinating. The only real cure is to remove the stent and then give time for the bladder wall to heal which can't be done until the danger of the ureter swelling shut has passed, which varies.  You may see some blood in your urine while the stent is in place and a few days afterwards. Do not be alarmed, even if the urine was clear for a while. Get off your feet and drink lots of fluids until clearing occurs. If you start to pass clots or don't improve, call us.  DIET: You may return to your normal diet immediately. Because of the raw surface of your bladder, alcohol, spicy foods, acid type foods and drinks with caffeine may cause irritation or frequency and should be used in moderation. To keep your urine flowing freely and to avoid constipation, drink plenty of fluids during the day ( 8-10 glasses ). Tip: Avoid cranberry juice because it is very acidic.  ACTIVITY: Your physical activity doesn't need to be restricted. However, if you are very active, you may see some blood in your urine. We suggest that you reduce your activity under these circumstances until the bleeding has stopped.  BOWELS: It is important to keep your bowels regular during the postoperative period. Straining with bowel movements can cause bleeding. A bowel movement every other day is reasonable. Use a mild laxative if needed, such as Milk of Magnesia 2-3 tablespoons, or 2 Dulcolax tablets. Call if you continue to have problems. If you have been taking narcotics for pain, before, during or after your surgery, you  may be constipated. Take a laxative if necessary.   MEDICATION: You should resume your pre-surgery medications unless told not to. In addition you will often be given an antibiotic to prevent infection. These should be taken as prescribed until the bottles are finished unless you are having an unusual reaction to one of the drugs.  PROBLEMS YOU SHOULD REPORT TO [place]: Fevers over 100.5  Fahrenheit. Heavy bleeding, or clots ( See above notes about blood in urine ). Inability to urinate. Drug reactions ( hives, rash, nausea, vomiting, diarrhea ). Severe burning or pain with urination that is not improving.   May take Tylenol starting at 1:30 PM as needed for pain.

## 2021-08-31 NOTE — Anesthesia Procedure Notes (Signed)
Procedure Name: LMA Insertion Date/Time: 08/31/2021 8:39 AM Performed by: Nahjae Hoeg D, CRNA Pre-anesthesia Checklist: Patient identified, Emergency Drugs available, Suction available and Patient being monitored Patient Re-evaluated:Patient Re-evaluated prior to induction Oxygen Delivery Method: Circle system utilized Preoxygenation: Pre-oxygenation with 100% oxygen Induction Type: IV induction Ventilation: Mask ventilation without difficulty LMA: LMA inserted LMA Size: 4.0 Tube type: Oral Number of attempts: 1 Placement Confirmation: positive ETCO2 and breath sounds checked- equal and bilateral Tube secured with: Tape Dental Injury: Teeth and Oropharynx as per pre-operative assessment

## 2021-08-31 NOTE — Anesthesia Postprocedure Evaluation (Signed)
Anesthesia Post Note  Patient: Rebecca [name]  Procedure(s) Performed: CYSTOSCOPY WITH BILATERAL RETROGRADE PYELOGRAM, URETEROSCOPY AND STENT PLACEMENT (Bilateral: Renal) HOLMIUM LASER APPLICATION (Bilateral: Renal)     Patient location during evaluation: PACU Anesthesia Type: General Level of consciousness: awake and alert and oriented Pain management: pain level controlled Vital Signs Assessment: post-procedure vital signs reviewed and stable Respiratory status: spontaneous breathing, nonlabored ventilation and respiratory function stable Cardiovascular status: blood pressure returned to baseline Postop Assessment: no apparent nausea or vomiting Anesthetic complications: no   No notable events documented.  Last Vitals:  Vitals:   08/31/21 0945 08/31/21 1000  BP: 124/64 133/66  Pulse: 90 82  Resp: 14 15  Temp:    SpO2: 98% 96%    Last Pain:  Vitals:   08/31/21 1000  TempSrc:   PainSc: 0-No pain                 Marthenia Rolling

## 2021-08-31 NOTE — H&P (Signed)
Rebecca [name] is an 66 y.o. female.    Chief Complaint: Pre-OP BILATERAL Ureteroscopic Stone Manipulation  HPI:   1 - Recurrent Urolithiasis -  Pre 2022 - URS by Louis Meckel  06/2021 - CT Rgith 14m x 2 lowe pole, LLP 14mNON-obsturucting stones. UA withotu significant infectious paremters.   PMH sig for Lumabgo, breast cancer (non recurrent after lumpectomy). Her PCP is WiUnk PintoD.   Today " Rebecca [name] is seen to proceed with BILATERAL ureteroscopy with goal of stone free to r/o intermittent obstruction as etiology of her back pain. No interval fevers. Most recent UA without infectious parameters.   Past Medical History:  Diagnosis Date   Depression    REMISSION/ SITUATIONAL   Dyspnea    Hepatitis AGE 64   RESOLVED   History of kidney stones    History of radiation therapy 09/24/17- 10/15/17   Right Breast treated to 42.56 Gy with 16 fx of 2.66 Gy   Hyperlipidemia    Insomnia    Migraines    HORMONAL   Personal history of chemotherapy    Personal history of radiation therapy    PONV (postoperative nausea and vomiting)    right breast ca dx' d 02/2017   Shingles outbreak 2003, 2015   RIGHT HIP, and between breasts 3 years ago    Past Surgical History:  Procedure Laterality Date   BREAST EXCISIONAL BIOPSY Right    BREAST LUMPECTOMY Right 02/2017   BREAST LUMPECTOMY WITH RADIOACTIVE SEED AND SENTINEL LYMPH NODE BIOPSY Right 03/19/2017   Procedure: RIGHT BREAST LUMPECTOMY WITH RADIOACTIVE SEED AND SENTINEL LYMPH NODE BIOPSY;  Surgeon: HoExcell SeltzerMD;  [place]: [place];  Laterality: Right;   KNEE ARTHROSCOPY Right 2002   TUBAL LIGATION     uterine polyps  2016    Family History  Problem Relation Age of Onset   Depression Brother    Lung cancer Brother [id number]     maternal half brother   Dementia Mother        ALZHEIMERS   Cancer Father [id number]     MELANOMA CAUSED DEATH/melanoma   Cancer Brother        multiple myeloma/lung -  maternal half brother   Stroke Maternal Grandmother [id number] Cancer Sister [id number]     breast- deceased at 4763 1/2 sister   Breast cancer Sister    Heart disease Maternal Grandfather    Cancer Paternal Grandfather        COLON   Cervical cancer Maternal Aunt    Lung cancer Maternal Uncle    Other Paternal Grandmother        died in childbirth   Dementia Maternal Aunt    Social History:  reports that she quit smoking about 13 years ago. Her smoking use included cigarettes. She has never used smokeless tobacco. She reports that she does not drink alcohol and does not use drugs.  Allergies:  Allergies  Allergen Reactions   Cholecalciferol Other (See Comments)    Headache   Citalopram Other (See Comments)    HYPERACTIVITY   Dexamethasone     Chest tightness   Dilaudid [Hydromorphone Hcl] Nausea And Vomiting   Propoxycaine Nausea And Vomiting   Red Yeast Rice [Cholestin] Other (See Comments)    headache   Rofecoxib Nausea And Vomiting   Sulfamethoxazole Swelling    Medications Prior to Admission  Medication Sig Dispense Refill   ibuprofen (ADVIL,MOTRIN)  200 MG tablet Take 200 mg by mouth every 6 (six) hours as needed for mild pain.     letrozole (FEMARA) 2.5 MG tablet TAKE 1 TABLET BY MOUTH EVERY DAY 90 tablet 3   LORazepam (ATIVAN) 0.5 MG tablet TAKE ONE TABLET BY MOUTH EVERY NIGHT AT BEDTIME AS NEEDED 30 tablet 3   promethazine-dextromethorphan (PROMETHAZINE-DM) 6.25-15 MG/5ML syrup Take 5 mLs by mouth 4 (four) times daily as needed for cough. (Patient not taking: Reported on 08/31/2021) 240 mL 1    Results for orders placed or performed during the hospital encounter of 08/31/21 (from the past 48 hour(s))  I-STAT, chem 8     Status: None   Collection Time: 08/31/21  7:44 AM  Result Value Ref Range   Sodium 141 135 - 145 mmol/L   Potassium 4.1 3.5 - 5.1 mmol/L   Chloride 104 98 - 111 mmol/L   BUN 16 8 - 23 mg/dL   Creatinine, Ser 0.60 0.44 - 1.00 mg/dL   Glucose, Bld 95 70 - 99  mg/dL    Comment: Glucose reference range applies only to samples taken after fasting for at least 8 hours.   Calcium, Ion 1.23 1.15 - 1.40 mmol/L   TCO2 25 22 - 32 mmol/L   Hemoglobin 13.9 12.0 - 15.0 g/dL   HCT 41.0 36.0 - 46.0 %   No results found.  Review of Systems  Constitutional:  Negative for chills and fever.  Musculoskeletal:  Positive for back pain.  All other systems reviewed and are negative.  Blood pressure 129/76, pulse 76, temperature 98.8 F (37.1 C), temperature source Oral, resp. rate 16, height 5' 6.5" (1.689 m), weight 77.8 kg, last menstrual period 08/03/2013, SpO2 99 %. Physical Exam Vitals reviewed.  HENT:     Head: Normocephalic.     Nose: Nose normal.  Eyes:     Pupils: Pupils are equal, round, and reactive to light.  Pulmonary:     Effort: Pulmonary effort is normal.  Abdominal:     General: Abdomen is flat.  Genitourinary:    Comments: No CVAT at present Musculoskeletal:        General: Normal range of motion.     Cervical back: Normal range of motion.  Skin:    General: Skin is warm.  Neurological:     General: No focal deficit present.     Mental Status: She is alert.  Psychiatric:        Mood and Affect: Mood normal.     Assessment/Plan  Proceed as planned with BILATERAL ureteroscopic stone manipulation. RIsks, benefits, alternatives, expected peri-op course discussed previously and reiterated today.   Alexis Frock, MD 08/31/2021, 7:58 AM

## 2021-08-31 NOTE — Transfer of Care (Signed)
Immediate Anesthesia Transfer of Care Note  Patient: Rebecca [name]  Procedure(s) Performed: CYSTOSCOPY WITH BILATERAL RETROGRADE PYELOGRAM, URETEROSCOPY AND STENT PLACEMENT (Bilateral: Renal) HOLMIUM LASER APPLICATION (Bilateral: Renal)  Patient Location: PACU  Anesthesia Type:General  Level of Consciousness: awake, alert  and oriented  Airway & Oxygen Therapy: Patient Spontanous Breathing and Patient connected to face mask oxygen  Post-op Assessment: Report given to RN and Post -op Vital signs reviewed and stable  Post vital signs: Reviewed and stable  Last Vitals:  Vitals Value Taken Time  BP 145/65 08/31/21 0936  Temp    Pulse 93 08/31/21 0937  Resp 20 08/31/21 0937  SpO2 100 % 08/31/21 0937  Vitals shown include unvalidated device data.  Last Pain:  Vitals:   08/31/21 0725  TempSrc: Oral  PainSc: 0-No pain      Patients Stated Pain Goal: 5 (08/29/02 5913)  Complications: No notable events documented.

## 2021-08-31 NOTE — Brief Op Note (Signed)
08/31/2021  9:26 AM  PATIENT:  Rebecca [name]  66 y.o. female  PRE-OPERATIVE DIAGNOSIS:  BILATERAL RENAL STONES  POST-OPERATIVE DIAGNOSIS:  BILATERAL RENAL STONES  PROCEDURE:  Procedure(s): CYSTOSCOPY WITH BILATERAL RETROGRADE PYELOGRAM, URETEROSCOPY AND STENT PLACEMENT (Bilateral) HOLMIUM LASER APPLICATION (Bilateral)  SURGEON:  Surgeon(s) and Role:    Alexis Frock, MD - Primary  PHYSICIAN ASSISTANT:   ASSISTANTS: none   ANESTHESIA:   general  EBL:  minimal   BLOOD ADMINISTERED:none  DRAINS: none   LOCAL MEDICATIONS USED:  NONE  SPECIMEN:  Source of Specimen:  renal stone fragments  DISPOSITION OF SPECIMEN:   [facility] for compositional analysis  COUNTS:  YES  TOURNIQUET:  * No tourniquets in log *  DICTATION: .Other Dictation: Dictation Number [id number]  PLAN OF CARE: Discharge to home after PACU  PATIENT DISPOSITION:  PACU - hemodynamically stable.   Delay start of Pharmacological VTE agent (>24hrs) due to surgical blood loss or risk of bleeding: not applicable

## 2021-08-31 NOTE — Op Note (Signed)
NAME: Rebecca [name], Rebecca [name] MEDICAL RECORD NO: [id number] ACCOUNT NO: [id number] DATE OF BIRTH: [birth year] FACILITY: [place] LOCATION: [facility] PHYSICIAN: Alexis Frock, MD  Operative Report   DATE OF PROCEDURE: 08/31/2021  PREOPERATIVE DIAGNOSES:  1.  Bilateral renal stones.  2. History of back and flank pain.   PROCEDURES: 1. Cystoscopy. 2. Bilateral retrograde pyelograms interpretation. 3.  Bilateral ureteroscopy with laser lithotripsy. 4.  Insertion of bilateral ureteral stents.  ESTIMATED BLOOD LOSS:  Nil.  COMPLICATIONS:  None.  SPECIMENS:  Bilateral renal stone fragments for analysis.  FINDINGS:    1.  Right lower pole free floating renal stones, total volume approximately 9 mm. 2.  Left papillary tip calcifications, total volume less than 3 mm.  3. Complete resolution of all accessible stone fragments following laser lithotripsy and basket extraction bilaterally. 4. Successful placements of  bilateral ureteral stents proximal end in the renal pelvis, distal end in urinary bladder with tether.  INDICATIONS:  The patient is a 66 year old lady with history of chronic pain as well as recurrent urolithiasis.  She was found on workup of back pain to have right greater than left intrarenal stones. These were not obstructing at the time.  She is quite  concerned that this is the etiology of her back pain.  She has known degenerative joint disease.  We  evaluated and we frankly discussed the possibility that these may or may not be related to her back pain. Management options including observation  versus medical therapy versus bilateral ureteroscopy for clean out to prevent problems in the future from her stones and to rule out intermittent obstruction and she wished to proceed with the latter.  Informed consent was obtained and placed in medical  record.  PROCEDURE IN DETAIL:  The patient being verified as Rebecca [name] and procedure being bilateral ureteroscopic stone laser was  confirmed. Procedure timeout was performed.  Intravenous antibiotics were administered.  General LMA anesthesia introduced.  The  patient was placed into a low lithotomy position.  A sterile field was created, prepped and draped the patient's vagina, introitus  and proximal thigh using iodine.  Cystourethroscopy was performed using 21-French rigid cystoscope with offset lens.  Inspection of the urinary bladder revealed no diverticula, calcifications or papillary lesions.  The right ureteral orifice was cannulated with a 6-French Foley catheter and right retrograde pyelogram was obtained.  Right retrograde pyelogram demonstrated single right ureter, single system right kidney.  No obvious filling defects or narrowing noted.  The zipwire was advanced once again and set aside as a safety wire.   Next, a left retrograde pyelogram was obtained.  Left retrograde pyelogram demonstrated a single left ureter, single system left kidney.  No filling defects or narrowing noted.  A separate zipwire advanced to the level of the upper pole and set aside as a safety wire.  The 8-French feeding tube was  placed in the urinary bladder for pressure release. A semirigid ureteroscopy was performed to distal four-fifths of the right ureter alongside a separate sensor working wire.  No mucosal abnormalities were found.  Next, a semirigid ureteroscopy was  performed to distal four-fifths of the ;eft ureter alongside a separate sensor working wire and no mucosal abnormalities were found. Next a 11/13 short length ureteral access sheath was placed over the right central working wire to the level of the  proximal ureter using continuous fluoroscopic guidance and flexible digital ureteroscopy within the proximal ureter and systematic inspection of the right kidney, including all calices x 3.  There were two dominant, free floating stones in the lower  pole.  These were too large for simple basketing.  They were grasped with the  basket and repositioned into the upper pole and to allow for less acute angulation and holmium laser energy applied at 70 settings of 0.2 joules and 20 Hz and they were  fragmented to approximately three smaller pieces, each of which were then amenable to basketing and removed and set aside for composition analysis.  Following this, complete resolution of all accessible stone fragments, following laser lithotripsy and  basket extraction bilaterally. No evidence of any perforation or mucosal abnormality.  The access sheath was removed under continuous vision and likewise no mucosal abnormalities of the ureters were seen.  Next, the access sheath was placed over the left  central working wire to the level of the proximal left ureter.  Using continuous fluoroscopic guidance, a flexible digital ureteroscopy was performed in the proximal left ureter and systemic inspection of the patient's left kidney including all calices  x 3.  There were no intrarenal free floating stones, only a small papillary tip calcification in the mid pole calix.  This was amenable to simple laser ablation, also using a setting of 0.2 joules and 20 Hz.  Following this, there was no intraluminal  stones whatsoever on the left side.  Access sheath was removed under continuous vision.  No significant mucosal abnormalities were found.  Given the bilateral nature of the procedure, it was felt that a brief interval stenting with tether stents seems to  be most prudent.  As such, a new 5 x 22 Polaris-type stents were placed over the remaining safety wires using fluoroscopic guidance.  Good proximal and distal plane were noted.  Tether was left in place and tied to each other, trimmed to length and  tucked per vagina and the procedure terminated.  The patient tolerated procedure well.  No immediate complications.  The patient was taken to postanesthesia care in stable condition.  Plan is for discharge home.   MUK D: 08/31/2021 9:31:37 am T:  08/31/2021 10:06:00 am  JOB: [id number]/ [id number]

## 2021-09-03 ENCOUNTER — Other Ambulatory Visit: Payer: Self-pay

## 2021-09-03 ENCOUNTER — Encounter (HOSPITAL_BASED_OUTPATIENT_CLINIC_OR_DEPARTMENT_OTHER): Payer: Self-pay | Admitting: Urology

## 2021-09-03 ENCOUNTER — Emergency Department (HOSPITAL_COMMUNITY)
Admission: EM | Admit: 2021-09-03 | Discharge: 2021-09-03 | Disposition: A | Discharge status: Home / Self Care | Payer: Medicare Other | Attending: Emergency Medicine | Admitting: Emergency Medicine

## 2021-09-03 ENCOUNTER — Emergency Department (HOSPITAL_COMMUNITY): Payer: Medicare Other

## 2021-09-03 DIAGNOSIS — R112 Nausea with vomiting, unspecified: Secondary | ICD-10-CM | POA: Diagnosis not present

## 2021-09-03 DIAGNOSIS — N200 Calculus of kidney: Secondary | ICD-10-CM | POA: Diagnosis not present

## 2021-09-03 DIAGNOSIS — Z5321 Procedure and treatment not carried out due to patient leaving prior to being seen by health care provider: Secondary | ICD-10-CM | POA: Insufficient documentation

## 2021-09-03 DIAGNOSIS — Z87442 Personal history of urinary calculi: Secondary | ICD-10-CM | POA: Insufficient documentation

## 2021-09-03 DIAGNOSIS — M549 Dorsalgia, unspecified: Secondary | ICD-10-CM | POA: Diagnosis not present

## 2021-09-03 DIAGNOSIS — I10 Essential (primary) hypertension: Secondary | ICD-10-CM | POA: Diagnosis not present

## 2021-09-03 DIAGNOSIS — R1084 Generalized abdominal pain: Secondary | ICD-10-CM | POA: Diagnosis not present

## 2021-09-03 DIAGNOSIS — R109 Unspecified abdominal pain: Secondary | ICD-10-CM | POA: Diagnosis not present

## 2021-09-03 MED ORDER — ONDANSETRON 4 MG PO TBDP: 4.0000 mg | ORAL_TABLET | Freq: Once | ORAL | Status: DC

## 2021-09-03 NOTE — ED Triage Notes (Signed)
Pt BIB EMS. Pt had her kidney stents removed today. (They were placed Friday for kidney stones) Tonight, pt started having abdominal and right flank/back pain. Pt complains of N/V.

## 2021-09-03 NOTE — ED Notes (Signed)
Pt refuse labs state she feel better and requesting to leave.

## 2021-09-03 NOTE — ED Provider Triage Note (Addendum)
Emergency Medicine Provider Triage Evaluation Note  Rebecca [name] , a 66 y.o. female  was evaluated in triage.  Pt complains of right-sided flank pain onset this morning.  Patient reports she was diagnosed with kidney stones and had stents placed bilaterally on Friday.  States stents were removed today.  She had mild pain over the weekend that significantly worsened around noon today following stent removal.  She reports initial episode was resolved following dose of Toradol and 10 mg of Percocet.  Reports she had another episode around supper which was not improved with 10 mg of Percocet.  She denies fever, chills, abdominal pain.  Reports this morning she had bilateral flank pain, but now the left sided flank pain has resolved.  She states when her husband spoke to the on-call provider I told her this was from the inflammation/swelling.   Review of Systems  Positive: As above Negative: As above  Physical Exam  BP 126/66 (BP Location: Left Arm)    Pulse 75    Temp 98.3 F (36.8 C) (Oral)    Resp 18    Ht 5' 6.5" (1.689 m)    Wt 77.1 kg    LMP 08/03/2013    SpO2 100%    BMI 27.03 kg/m  Gen:   Awake, no distress   Resp:  Normal effort  MSK:   Moves extremities without difficulty  Other:  Abdomen without tenderness palpation.  Right-sided flank tenderness present on exam.  Medical Decision Making  Medically screening exam initiated at 11:06 PM.  Appropriate orders placed.  NEIVA MAENZA was informed that the remainder of the evaluation will be completed by another provider, this initial triage assessment does not replace that evaluation, and the importance of remaining in the ED until their evaluation is complete.  Discussed obtaining CT renal however patient does not want to get the CT scan due to concern of insurance.  She stated she would follow-up with her urologist in the morning.  She is amenable to stay have lab work done and be reevaluated and readdress need for CT scan.    Evlyn Courier,  PA-C 09/03/21 2309    Evlyn Courier, PA-C 09/03/21 2316

## 2021-09-04 DIAGNOSIS — N130 Hydronephrosis with ureteropelvic junction obstruction: Secondary | ICD-10-CM | POA: Diagnosis not present

## 2021-09-04 DIAGNOSIS — R1084 Generalized abdominal pain: Secondary | ICD-10-CM | POA: Diagnosis not present

## 2021-09-04 DIAGNOSIS — N202 Calculus of kidney with calculus of ureter: Secondary | ICD-10-CM | POA: Diagnosis not present

## 2021-09-04 DIAGNOSIS — N132 Hydronephrosis with renal and ureteral calculous obstruction: Secondary | ICD-10-CM | POA: Diagnosis not present

## 2021-09-04 DIAGNOSIS — D259 Leiomyoma of uterus, unspecified: Secondary | ICD-10-CM | POA: Diagnosis not present

## 2021-09-13 DIAGNOSIS — N130 Hydronephrosis with ureteropelvic junction obstruction: Secondary | ICD-10-CM | POA: Diagnosis not present

## 2021-09-14 ENCOUNTER — Encounter: Payer: Self-pay | Admitting: Internal Medicine

## 2021-09-27 DIAGNOSIS — N130 Hydronephrosis with ureteropelvic junction obstruction: Secondary | ICD-10-CM | POA: Diagnosis not present

## 2021-10-04 ENCOUNTER — Other Ambulatory Visit: Payer: Self-pay | Admitting: Hematology and Oncology

## 2021-10-08 ENCOUNTER — Encounter: Payer: Self-pay | Admitting: Nurse Practitioner

## 2021-10-11 DIAGNOSIS — R1084 Generalized abdominal pain: Secondary | ICD-10-CM | POA: Diagnosis not present

## 2021-10-11 DIAGNOSIS — N134 Hydroureter: Secondary | ICD-10-CM | POA: Diagnosis not present

## 2021-10-11 DIAGNOSIS — N1330 Unspecified hydronephrosis: Secondary | ICD-10-CM | POA: Diagnosis not present

## 2021-10-19 DIAGNOSIS — Z01419 Encounter for gynecological examination (general) (routine) without abnormal findings: Secondary | ICD-10-CM | POA: Diagnosis not present

## 2021-10-19 DIAGNOSIS — R87615 Unsatisfactory cytologic smear of cervix: Secondary | ICD-10-CM | POA: Diagnosis not present

## 2021-10-19 DIAGNOSIS — Z1151 Encounter for screening for human papillomavirus (HPV): Secondary | ICD-10-CM | POA: Diagnosis not present

## 2021-10-19 DIAGNOSIS — N952 Postmenopausal atrophic vaginitis: Secondary | ICD-10-CM | POA: Diagnosis not present

## 2021-10-19 DIAGNOSIS — Z124 Encounter for screening for malignant neoplasm of cervix: Secondary | ICD-10-CM | POA: Diagnosis not present

## 2021-10-19 DIAGNOSIS — Z6827 Body mass index (BMI) 27.0-27.9, adult: Secondary | ICD-10-CM | POA: Diagnosis not present

## 2021-10-25 DIAGNOSIS — N130 Hydronephrosis with ureteropelvic junction obstruction: Secondary | ICD-10-CM | POA: Diagnosis not present

## 2021-10-31 NOTE — Progress Notes (Signed)
COMPLETE PHYSICAL  Assessment and Plan:  Encounter for general adult medical examination with abnormal findings 1 year  Hyperlipidemia, unspecified hyperlipidemia type -     Lipid panel check lipids- would like to stay away from Statins decrease fatty foods increase activity.   Abnormal glucose -     Hemoglobin A1c 'Discussed disease progression and risks Discussed diet/exercise, weight management and risk modification  Vitamin D deficiency -     VITAMIN D 25 Hydroxy (Vit-D Deficiency, Fractures)  Family history of melanoma Monitor skin, no issues at this time.   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 Lindi Adie again Continue on Femara  Overweight Long discussion about weight loss, diet, and exercise Recommended diet heavy in fruits and veggies and low in animal meats, cheeses, and dairy products, appropriate calorie intake Follow up at next visit   Screening, ischemic heart disease -     EKG 12-Lead  Screening for blood or protein in urine -     Urinalysis w microscopic + reflex cultue - Microalbumin/creatinine urine ratio  Screening for thyroid disorder -     TSH   Insomnia, unspecified type -     TSH - rarely takes lorazepam 0.$RemoveBeforeDEI'5mg'iajdFnbBwGeLajev$   Discussed med's effects and SE's. Screening labs and tests as requested with regular follow-up as recommended.   Further disposition pending results if labs check today. Discussed med's effects and SE's.   Over 30 minutes of face to face interview, exam, counseling, chart review, and critical decision making was performed.   Future Appointments  Date Time Provider [place]  03/01/2022  8:15 AM Nicholas Lose, MD [facility] None  11/04/2022  2:00 PM Magda Bernheim, NP [facility] None    HPI  66 y.o.  female  presents for a complete physical.  Her blood pressure has been controlled at home, today their BP is BP: 110/60  BP Readings from Last 3 Encounters:  11/01/21 110/60  09/03/21 126/66  08/31/21 125/70  She does workout. She denies chest pain,  dizziness.   She has a history of kidney stones with last treatment through [facility] of laser. Has had persistent pain and still has several kidney stones.She continues to follow with urology and returns next week for further evaluation.   She stopped her menstrual cycle at age 70. She was found to have uterine fibroids on CT. She is not having any current abdominal pain or dysfunctional bleeding.    She had a Right lumpectomy on 03/19/2017 with Dr. Excell Seltzer: IDC with DCIS, 1.2 cm, grade 2, margins negative, 0/2 lymph nodes negative, ER 100%, PR 10%, HER-2 negative ratio 1.73, Ki-67 40% T1BN0 stage IA and has been getting systemic chemo, finished Jan 2019, going to follow back up with [facility], Dr. Lindi Adie. NEXT OV is 02/2021.  From previous OV: She states she has DOE with hills or with stairs for 5-6 months. No coughing or wheezing. She denies chest pain, leg swelling, dizziness, sweating. No CXR, echo 2018 EF 65%.  History of smoking 15 years ago, smoked 25 years just on weekends.   Had shingles again, this is the 4th time, has not had shingles vaccine.  Today: BMI is Body mass index is 27.63 kg/m., she is working on diet and exercise. Wt Readings from Last 3 Encounters:  11/01/21 171 lb 3.2 oz (77.7 kg)  09/03/21 170 lb (77.1 kg)  08/31/21 171 lb 8 oz (77.8 kg)   She is on ativan PRN for sleep, takes every 2 weeks but does not help.  States she needs 4-5 hours of sleep a night and feels rested with that.   She is on cholesterol medication, not fasting and denies myalgias. Her cholesterol is not at goal. The cholesterol last visit was:  Lab Results  Component Value Date   CHOL 246 (H) 10/05/2020   HDL 67 10/05/2020   LDLCALC  160 (H) 10/05/2020   TRIG 84 10/05/2020   CHOLHDL 3.7 10/05/2020  . She has been working on diet and exercise for prediabetes,  and denies foot ulcerations, hyperglycemia, hypoglycemia , increased appetite, nausea, paresthesia of the feet, polydipsia, polyuria, visual disturbances, vomiting and weight loss. Last A1C in the office was:  Lab Results  Component Value Date   HGBA1C 5.5 10/05/2020   Patient is on Vitamin D supplement, she can not tolerate Vitamin D supplementation, has tried all forms but they give her a headache.  Lab Results  Component Value Date   VD25OH 18 (L) 10/05/2020       Current Medications:  Current Outpatient Medications on File Prior to Visit  Medication Sig Dispense Refill   letrozole (FEMARA) 2.5 MG tablet TAKE 1 TABLET BY MOUTH DAILY 90 tablet 3   LORazepam (ATIVAN) 0.5 MG tablet TAKE ONE TABLET BY MOUTH EVERY NIGHT AT BEDTIME AS NEEDED 30 tablet 3   No current facility-administered medications on file prior to visit.    Health Maintenance:   Immunization History  Administered Date(s) Administered   Influenza Inj Mdck Quad With Preservative 06/26/2018   Influenza,inj,Quad PF,6+ Mos 06/25/2017   Td 08/27/2007   Tdap 03/16/2018   Tetanus: 2019 Flu vaccine:2018 Pap: 09/2021 [facility]  04/2021 DEXA: Dr. Ophelia Charter 2019 AT [facility] Colonoscopy: 2019, outlaw due 2024 Last Eye Exam: DUE Echo 2018 Dental Exam: DUE   Patient Care Team: [name], MD as PCP - General (Internal Medicine) Minus Breeding, MD as PCP - Cardiology (Cardiology) [name], MD (Internal Medicine) Arta Silence, MD as Consulting Physician (Gastroenterology) Arvella Nigh, MD as Consulting Physician (Obstetrics and Gynecology) Nicholas Lose, MD as Consulting Physician (Hematology and Oncology) Eppie [place], MD as Attending Physician (Radiation Oncology) Excell Seltzer, MD (Inactive) as Consulting Physician (General Surgery)  Medical History:  Past Medical History:   Diagnosis Date   Depression    REMISSION/ SITUATIONAL   Dyspnea    Hepatitis AGE 36   RESOLVED   History of kidney stones    History of radiation therapy 09/24/17- 10/15/17   Right Breast treated to 42.56 Gy with 16 fx of 2.66 Gy   Hyperlipidemia    Insomnia    Migraines    HORMONAL   Personal history of chemotherapy    Personal history of radiation therapy    PONV (postoperative nausea and vomiting)    right breast ca dx' d 02/2017   Shingles outbreak 2003, 2015   RIGHT HIP, and between breasts 3 years ago    Allergies Allergies  Allergen Reactions   Cholecalciferol Other (See Comments)    Headache   Citalopram Other (See Comments)    HYPERACTIVITY   Dexamethasone     Chest tightness   Dilaudid [Hydromorphone Hcl] Nausea And Vomiting   Propoxycaine Nausea And Vomiting   Red Yeast Rice [Cholestin] Other (See Comments)    headache   Rofecoxib Nausea And Vomiting   Sulfamethoxazole Swelling    SURGICAL  HISTORY She  has a past surgical history that includes Knee arthroscopy (Right, 2002); Tubal ligation; uterine polyps (2016); Breast lumpectomy with radioactive seed and sentinel lymph node biopsy (Right, 03/19/2017); Breast lumpectomy (Right, 02/2017); Breast excisional biopsy (Right); Cystoscopy with retrograde pyelogram, ureteroscopy and stent placement (Bilateral, 08/31/2021); and Holmium laser application (Bilateral, 08/31/2021). FAMILY HISTORY Her family history includes Breast cancer in her sister; Cancer in her brother and paternal grandfather; Cancer (age of onset: 31) in her sister; Cancer (age of onset: 93) in her father; Cervical cancer in her maternal aunt; Dementia in her maternal aunt and mother; Depression in her brother; Heart disease in her maternal grandfather; Lung cancer in her maternal uncle; Lung cancer (age of onset: 91) in her brother; Other in her paternal grandmother; Stroke (age of onset: 26) in her maternal grandmother. SOCIAL HISTORY She  reports that  she quit smoking about 13 years ago. Her smoking use included cigarettes. She has never used smokeless tobacco. She reports that she does not drink alcohol and does not use drugs. 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    Review of Systems: Review of Systems  Constitutional:  Negative for chills, fever and malaise/fatigue.  HENT:  Negative for congestion, ear pain and sore throat.   Eyes: Negative.  Negative for blurred vision and double vision.  Respiratory:  Positive for shortness of breath. Negative for cough, hemoptysis, sputum production and wheezing.   Cardiovascular:  Negative for chest pain, palpitations, orthopnea, claudication, leg swelling and PND.  Gastrointestinal:  Negative for abdominal pain, blood in stool, constipation, diarrhea, heartburn and melena.  Genitourinary:        Kidney stones  Musculoskeletal:  Negative for back pain, joint pain and neck pain.  Skin: Negative.   Neurological:  Negative for dizziness, sensory change, loss of consciousness and headaches.  Psychiatric/Behavioral:  Negative for depression, hallucinations, substance abuse and suicidal ideas. The patient is nervous/anxious. The patient does not have insomnia.    Physical Exam: Estimated body mass index is 27.63 kg/m as calculated from the following:   Height as of this encounter: $RemoveBeforeD'5\' 6"'IzUReUBLINUEpf$  (1.676 m).   Weight as of this encounter: 171 lb 3.2 oz (77.7 kg). BP 110/60    Pulse 78    Temp 97.9 F (36.6 C)    Ht $R'5\' 6"'KN$  (1.676 m)    Wt 171 lb 3.2 oz (77.7 kg)    LMP 08/03/2013    SpO2 98%    BMI 27.63 kg/m   General Appearance: Well nourished well developed, in no apparent distress.  Eyes: PERRLA, EOMs, conjunctiva no swelling or erythema ENT/Mouth: Ear canals normal without obstruction, swelling, erythema, or discharge.  TMs normal bilaterally with no erythema, bulging, retraction, or loss of landmark.  Oropharynx moist and clear with no exudate, erythema, or swelling.   Neck: Supple, thyroid  normal. No bruits.  No cervical adenopathy Respiratory: Respiratory effort normal, + wheezing RML without rhonchi, rales.   Cardio: RRR without murmurs, rubs or gallops. Brisk peripheral pulses without edema.  Chest: symmetric, with normal excursions Abdomen: Soft, nontender, no guarding, rebound, hernias, masses, or organomegaly.  Lymphatics: Non tender without lymphadenopathy.  Musculoskeletal: Full ROM all peripheral extremities,5/5 strength, and normal gait.  Skin: Warm, dry without rashes, lesions, ecchymosis. Neuro: Awake and oriented X 3, Cranial nerves intact, reflexes equal bilaterally. Normal muscle tone, no cerebellar symptoms. Sensation intact.  Psych:  normal affect, Insight and Judgment appropriate.   EKG: WNL, no ST changes   Malon Siddall W  Henriette Hesser, NP 2:24 PM [facility]

## 2021-11-01 ENCOUNTER — Other Ambulatory Visit: Payer: Self-pay

## 2021-11-01 ENCOUNTER — Encounter: Payer: Self-pay | Admitting: Nurse Practitioner

## 2021-11-01 ENCOUNTER — Ambulatory Visit (INDEPENDENT_AMBULATORY_CARE_PROVIDER_SITE_OTHER): Payer: Medicare Other | Admitting: Nurse Practitioner

## 2021-11-01 VITALS — BP 110/60 | HR 78 | Temp 97.9°F | Ht 66.0 in | Wt 171.2 lb

## 2021-11-01 DIAGNOSIS — Z0000 Encounter for general adult medical examination without abnormal findings: Secondary | ICD-10-CM

## 2021-11-01 DIAGNOSIS — Z79899 Other long term (current) drug therapy: Secondary | ICD-10-CM | POA: Diagnosis not present

## 2021-11-01 DIAGNOSIS — Z808 Family history of malignant neoplasm of other organs or systems: Secondary | ICD-10-CM

## 2021-11-01 DIAGNOSIS — C50919 Malignant neoplasm of unspecified site of unspecified female breast: Secondary | ICD-10-CM

## 2021-11-01 DIAGNOSIS — Z170 Estrogen receptor positive status [ER+]: Secondary | ICD-10-CM

## 2021-11-01 DIAGNOSIS — Z136 Encounter for screening for cardiovascular disorders: Secondary | ICD-10-CM | POA: Diagnosis not present

## 2021-11-01 DIAGNOSIS — Z1502 Genetic susceptibility to malignant neoplasm of ovary: Secondary | ICD-10-CM

## 2021-11-01 DIAGNOSIS — R7309 Other abnormal glucose: Secondary | ICD-10-CM

## 2021-11-01 DIAGNOSIS — E785 Hyperlipidemia, unspecified: Secondary | ICD-10-CM

## 2021-11-01 DIAGNOSIS — I10 Essential (primary) hypertension: Secondary | ICD-10-CM | POA: Diagnosis not present

## 2021-11-01 DIAGNOSIS — E559 Vitamin D deficiency, unspecified: Secondary | ICD-10-CM | POA: Diagnosis not present

## 2021-11-01 DIAGNOSIS — Z1389 Encounter for screening for other disorder: Secondary | ICD-10-CM

## 2021-11-01 DIAGNOSIS — Z0001 Encounter for general adult medical examination with abnormal findings: Secondary | ICD-10-CM

## 2021-11-01 DIAGNOSIS — C50411 Malignant neoplasm of upper-outer quadrant of right female breast: Secondary | ICD-10-CM

## 2021-11-01 DIAGNOSIS — Z6827 Body mass index (BMI) 27.0-27.9, adult: Secondary | ICD-10-CM

## 2021-11-01 DIAGNOSIS — G43809 Other migraine, not intractable, without status migrainosus: Secondary | ICD-10-CM

## 2021-11-01 DIAGNOSIS — Z1329 Encounter for screening for other suspected endocrine disorder: Secondary | ICD-10-CM

## 2021-11-01 DIAGNOSIS — E663 Overweight: Secondary | ICD-10-CM

## 2021-11-01 DIAGNOSIS — G4700 Insomnia, unspecified: Secondary | ICD-10-CM

## 2021-11-01 NOTE — Addendum Note (Signed)
Addended by: Magda Bernheim on: 11/01/2021 02:59 PM ? ? Modules accepted: Level of Service ? ?

## 2021-11-01 NOTE — Patient Instructions (Signed)

## 2021-11-02 ENCOUNTER — Telehealth: Payer: Self-pay | Admitting: Orthopaedic Surgery

## 2021-11-02 LAB — COMPLETE METABOLIC PANEL WITH GFR
AG Ratio: 1.7 (calc) (ref 1.0–2.5)
ALT: 11 U/L (ref 6–29)
AST: 15 U/L (ref 10–35)
Albumin: 4.4 g/dL (ref 3.6–5.1)
Alkaline phosphatase (APISO): 62 U/L (ref 37–153)
BUN: 12 mg/dL (ref 7–25)
CO2: 28 mmol/L (ref 20–32)
Calcium: 9.4 mg/dL (ref 8.6–10.4)
Chloride: 103 mmol/L (ref 98–110)
Creat: 0.53 mg/dL (ref 0.50–1.05)
Globulin: 2.6 g/dL (calc) (ref 1.9–3.7)
Glucose, Bld: 85 mg/dL (ref 65–99)
Potassium: 4.2 mmol/L (ref 3.5–5.3)
Sodium: 140 mmol/L (ref 135–146)
Total Bilirubin: 0.7 mg/dL (ref 0.2–1.2)
Total Protein: 7 g/dL (ref 6.1–8.1)
eGFR: 103 mL/min/{1.73_m2} (ref >=60)

## 2021-11-02 LAB — CBC WITH DIFFERENTIAL/PLATELET
Absolute Monocytes: 495 cells/uL (ref 200–950)
Basophils Absolute: 53 cells/uL (ref 0–200)
Basophils Relative: 0.7 %
Eosinophils Absolute: 83 cells/uL (ref 15–500)
Eosinophils Relative: 1.1 %
HCT: 39.1 % (ref 35.0–45.0)
Hemoglobin: 13 g/dL (ref 11.7–15.5)
Lymphs Abs: 1583 cells/uL (ref 850–3900)
MCH: 30.4 pg (ref 27.0–33.0)
MCHC: 33.2 g/dL (ref 32.0–36.0)
MCV: 91.4 fL (ref 80.0–100.0)
MPV: 11.7 fL (ref 7.5–12.5)
Monocytes Relative: 6.6 %
Neutro Abs: 5288 cells/uL (ref 1500–7800)
Neutrophils Relative %: 70.5 %
Platelets: 202 10*3/uL (ref 140–400)
RBC: 4.28 10*6/uL (ref 3.80–5.10)
RDW: 12.1 % (ref 11.0–15.0)
Total Lymphocyte: 21.1 %
WBC: 7.5 10*3/uL (ref 3.8–10.8)

## 2021-11-02 LAB — LIPID PANEL
Cholesterol: 219 mg/dL — ABNORMAL HIGH (ref <200)
HDL: 59 mg/dL (ref >=50)
LDL Cholesterol (Calc): 136 mg/dL (calc) — ABNORMAL HIGH
Non-HDL Cholesterol (Calc): 160 mg/dL (calc) — ABNORMAL HIGH (ref <130)
Total CHOL/HDL Ratio: 3.7 (calc) (ref <5.0)
Triglycerides: 120 mg/dL (ref <150)

## 2021-11-02 LAB — MAGNESIUM: Magnesium: 2.1 mg/dL (ref 1.5–2.5)

## 2021-11-02 LAB — URINALYSIS, ROUTINE W REFLEX MICROSCOPIC
Bilirubin Urine: NEGATIVE
Glucose, UA: NEGATIVE
Hgb urine dipstick: NEGATIVE
Ketones, ur: NEGATIVE
Leukocytes,Ua: NEGATIVE
Nitrite: NEGATIVE
Protein, ur: NEGATIVE
Specific Gravity, Urine: 1.004 (ref 1.001–1.035)
pH: 7 (ref 5.0–8.0)

## 2021-11-02 LAB — MICROALBUMIN / CREATININE URINE RATIO
Creatinine, Urine: 14 mg/dL — ABNORMAL LOW (ref 20–275)
Microalb, Ur: <0.2 mg/dL

## 2021-11-02 LAB — HEMOGLOBIN A1C
Hgb A1c MFr Bld: 5.5 % of total Hgb (ref <5.7)
Mean Plasma Glucose: 111 mg/dL
eAG (mmol/L): 6.2 mmol/L

## 2021-11-02 LAB — TSH: TSH: 1.8 mIU/L (ref 0.40–4.50)

## 2021-11-02 LAB — VITAMIN D 25 HYDROXY (VIT D DEFICIENCY, FRACTURES): Vit D, 25-Hydroxy: 37 ng/mL (ref 30–100)

## 2021-11-02 NOTE — Telephone Encounter (Signed)
Received call from pt. She is requesting cervical spine xrays. Please call when ready. [phone] ?

## 2021-11-05 NOTE — Telephone Encounter (Signed)
Left voicemail advising patient CD was ready for pickup. ? ?

## 2021-11-09 DIAGNOSIS — N130 Hydronephrosis with ureteropelvic junction obstruction: Secondary | ICD-10-CM | POA: Diagnosis not present

## 2021-11-12 ENCOUNTER — Ambulatory Visit: Payer: Medicare Other | Admitting: Dermatology

## 2021-11-12 ENCOUNTER — Other Ambulatory Visit: Payer: Self-pay | Admitting: Dermatology

## 2021-11-12 ENCOUNTER — Other Ambulatory Visit: Payer: Self-pay

## 2021-11-12 DIAGNOSIS — Z1283 Encounter for screening for malignant neoplasm of skin: Secondary | ICD-10-CM | POA: Diagnosis not present

## 2021-11-12 DIAGNOSIS — L820 Inflamed seborrheic keratosis: Secondary | ICD-10-CM | POA: Diagnosis not present

## 2021-11-12 DIAGNOSIS — D1801 Hemangioma of skin and subcutaneous tissue: Secondary | ICD-10-CM

## 2021-11-12 DIAGNOSIS — D485 Neoplasm of uncertain behavior of skin: Secondary | ICD-10-CM

## 2021-11-12 NOTE — Patient Instructions (Signed)
Biopsy, Surgery (Curettage) & Surgery (Excision) Aftercare Instructions ? ?1. Okay to remove bandage in 24 hours ? ?2. Wash area with soap and water ? ?3. Apply Vaseline to area twice daily until healed (Not Neosporin) ? ?4. Okay to cover with a Band-Aid to decrease the chance of infection or prevent irritation from clothing; also it's okay to uncover lesion at home. ? ?5. Suture instructions: return to our office in 7-10 or 10-14 days for a nurse visit for suture removal. Variable healing with sutures, if pain or itching occurs call our office. It's okay to shower or bathe 24 hours after sutures are given. ? ?6. The following risks may occur after a biopsy, curettage or excision: bleeding, scarring, discoloration, recurrence, infection (redness, yellow drainage, pain or swelling). ? ?7. For questions, concerns and results call our office at [facility] before 4pm & Friday before 3pm. Biopsy results will be available in 1 week. ?Biopsy, Surgery (Curettage) & Surgery (Excision) Aftercare Instructions ? ?1. Okay to remove bandage in 24 hours ? ?2. Wash area with soap and water ? ?3. Apply Vaseline to area twice daily until healed (Not Neosporin) ? ?4. Okay to cover with a Band-Aid to decrease the chance of infection or prevent irritation from clothing; also it's okay to uncover lesion at home. ? ?5. Suture instructions: return to our office in 7-10 or 10-14 days for a nurse visit for suture removal. Variable healing with sutures, if pain or itching occurs call our office. It's okay to shower or bathe 24 hours after sutures are given. ? ?6. The following risks may occur after a biopsy, curettage or excision: bleeding, scarring, discoloration, recurrence, infection (redness, yellow drainage, pain or swelling). ? ?7. For questions, concerns and results call our office at [facility] before 4pm & Friday before 3pm. Biopsy results will be available in 1 week. ? ?

## 2021-11-25 ENCOUNTER — Encounter: Payer: Self-pay | Admitting: Dermatology

## 2021-11-25 NOTE — Progress Notes (Signed)
? ?  New Patient ?  ?Subjective  ?Rebecca [name] is a 66 y.o. female who presents for the following: Skin Problem (Pt has a spot of concern on the L neck. Pt states that in previous appts, removal was discussed. Pt would like it removed ). ? ?Skin check, change in spot on left neck ?Location:  ?Duration:  ?Quality:  ?Associated Signs/Symptoms: ?Modifying Factors:  ?Severity:  ?Timing: ?Context:  ? ? ?The following portions of the chart were reviewed this encounter and updated as appropriate:  Tobacco  Allergies  Meds  Problems  Med Hx  Surg Hx  Fam Hx   ?  ? ?Objective  ?Well appearing patient in no apparent distress; mood and affect are within normal limits. ?Nancy Fetter exposed areas and back examined: No atypical pigmented lesions or nonmelanoma skin cancer. ? ?Chest - Medial [facility]) ?Multiple 1 mm smooth red dermal papules ? ?Left Anterior Neck ?Verrucous slightly inflamed 1.2 cm crust ? ? ? ? ? ? ? ? ?All sun exposed areas plus back examined. ? ? ?Assessment & Plan  ?Cherry angioma ?Chest - Medial [facility]) ? ?No intervention necessary ? ?Neoplasm of uncertain behavior of skin ?Left Anterior Neck ? ?Skin / nail biopsy ?Type of biopsy: tangential   ?Informed consent: discussed and consent obtained   ?Timeout: patient name, date of birth, surgical site, and procedure verified   ?Anesthesia: the lesion was anesthetized in a standard fashion   ?Anesthetic:  1% lidocaine w/ epinephrine 1-100,000 local infiltration ?Instrument used: flexible razor blade   ?Hemostasis achieved with: aluminum chloride and electrodesiccation   ?Outcome: patient tolerated procedure well   ?Post-procedure details: wound care instructions given   ? ?Specimen 1 - Surgical pathology ?Differential Diagnosis: r/o irritated sk- cautery  ?Check Margins: No ? ?Encounter for screening for malignant neoplasm of skin ? ?Annual skin examination. ? ? ?

## 2022-01-03 ENCOUNTER — Ambulatory Visit (INDEPENDENT_AMBULATORY_CARE_PROVIDER_SITE_OTHER): Payer: Medicare Other | Admitting: Nurse Practitioner

## 2022-01-03 ENCOUNTER — Encounter: Payer: Self-pay | Admitting: Nurse Practitioner

## 2022-01-03 VITALS — BP 118/76 | HR 80 | Temp 97.9°F | Resp 16 | Ht 66.0 in | Wt 168.6 lb

## 2022-01-03 DIAGNOSIS — M545 Low back pain, unspecified: Secondary | ICD-10-CM

## 2022-01-03 DIAGNOSIS — R350 Frequency of micturition: Secondary | ICD-10-CM | POA: Diagnosis not present

## 2022-01-03 DIAGNOSIS — R3911 Hesitancy of micturition: Secondary | ICD-10-CM | POA: Diagnosis not present

## 2022-01-03 DIAGNOSIS — R300 Dysuria: Secondary | ICD-10-CM

## 2022-01-03 MED ORDER — NITROFURANTOIN MONOHYD MACRO 100 MG PO CAPS: 100.0000 mg | ORAL_CAPSULE | Freq: Two times a day (BID) | ORAL | 0 refills | Status: DC

## 2022-01-03 NOTE — Progress Notes (Signed)
Assessment and Plan: ? ?Rebecca [name] was seen today for an episodic visit. ? ?Diagnoses and all order for this visit: ? ?1. Dysuria ?Continue to stay well hydrated. ?Consider cranberry supplement if reoccurring ? ?- nitrofurantoin, macrocrystal-monohydrate, (MACROBID) 100 MG capsule; Take 1 capsule (100 mg total) by mouth 2 (two) times daily for 5 days.  Dispense: 10 capsule; Refill: 0 ?- Urinalysis w microscopic + reflex cultur ? ?2. Urinary frequency ? ?- nitrofurantoin, macrocrystal-monohydrate, (MACROBID) 100 MG capsule; Take 1 capsule (100 mg total) by mouth 2 (two) times daily for 5 days.  Dispense: 10 capsule; Refill: 0 ?- Urinalysis w microscopic + reflex cultur ? ?3. Hesitancy of micturition ? ?- nitrofurantoin, macrocrystal-monohydrate, (MACROBID) 100 MG capsule; Take 1 capsule (100 mg total) by mouth 2 (two) times daily for 5 days.  Dispense: 10 capsule; Refill: 0 ?- Urinalysis w microscopic + reflex cultur ? ?4. Acute bilateral low back pain without sciatica ? ?- nitrofurantoin, macrocrystal-monohydrate, (MACROBID) 100 MG capsule; Take 1 capsule (100 mg total) by mouth 2 (two) times daily for 5 days.  Dispense: 10 capsule; Refill: 0 ?- Urinalysis w microscopic + reflex cultur ? ? ?Continue to monitor for any increase in fever, chills, N/V, abdominal pain, blood in urine.   ? ?Notify office for further evaluation and treatment, questions or concerns if s/s fail to improve. The risks and benefits of my recommendations, as well as other treatment options were discussed with the patient today. Questions were answered. ? ?Further disposition pending results of labs. Discussed med's effects and SE's.   ? ?Over 15 minutes of exam, counseling, chart review, and critical decision making was performed.  ? ?Future Appointments  ?Date Time Provider [place]  ?03/01/2022  8:15 AM Nicholas Lose, MD [facility] None  ?11/04/2022  2:00 PM Magda Bernheim, NP [facility] None   ? ? ?------------------------------------------------------------------------------------------------------------------ ? ? ?HPI ?BP 118/76   Pulse 80   Temp 97.9 ?F (36.6 ?C)   Resp 16   Ht '5\' 6"'$  (1.676 m)   Wt 168 lb 9.6 oz (76.5 kg)   LMP 08/03/2013   SpO2 96%   BMI 27.21 kg/m?  ? ? Rebecca [name] is a 66 y.o. female who complains of burning with urination, dysuria, hesitancy, and incomplete bladder emptying. She has had symptoms for 4 days. Patient also complains of back pain. Patient denies back pain. Patient does not have a history of recurrent UTI. Patient does not have a history of pyelonephritis.  ? ?Past Medical History:  ?Diagnosis Date  ? Depression   ? REMISSION/ SITUATIONAL  ? Dyspnea   ? Hepatitis AGE 1  ? RESOLVED  ? History of kidney stones   ? History of radiation therapy 09/24/17- 10/15/17  ? Right Breast treated to 42.56 Gy with 16 fx of 2.66 Gy  ? Hyperlipidemia   ? Insomnia   ? Migraines   ? HORMONAL  ? Personal history of chemotherapy   ? Personal history of radiation therapy   ? PONV (postoperative nausea and vomiting)   ? right breast ca dx' d 02/2017  ? Shingles outbreak 2003, 2015  ? RIGHT HIP, and between breasts 3 years ago  ?  ? ?Allergies  ?Allergen Reactions  ? Cholecalciferol Other (See Comments)  ?  Headache  ? Citalopram Other (See Comments)  ?  HYPERACTIVITY  ? Dexamethasone   ?  Chest tightness  ? Dilaudid [Hydromorphone Hcl] Nausea And Vomiting  ? Propoxycaine Nausea And Vomiting  ? Red Yeast Rice [  Cholestin] Other (See Comments)  ?  headache  ? Rofecoxib Nausea And Vomiting and Nausea Only  ? Sulfa Antibiotics Swelling  ? Sulfamethoxazole Swelling  ? ? ?Current Outpatient Medications on File Prior to Visit  ?Medication Sig  ? Ibuprofen 200 MG CAPS Advil  ? letrozole (FEMARA) 2.5 MG tablet TAKE 1 TABLET BY MOUTH DAILY  ? LORazepam (ATIVAN) 0.5 MG tablet TAKE ONE TABLET BY MOUTH EVERY NIGHT AT BEDTIME AS NEEDED  ? ?No current facility-administered medications on file prior  to visit.  ? ? ?ROS: all negative except what is noted in the HPI.  ? ? ?Physical Exam: ? ?BP 118/76   Pulse 80   Temp 97.9 ?F (36.6 ?C)   Resp 16   Ht '5\' 6"'$  (1.676 m)   Wt 168 lb 9.6 oz (76.5 kg)   LMP 08/03/2013   SpO2 96%   BMI 27.21 kg/m?  ? ?General Appearance: NAD.  Awake, conversant and cooperative. ?Eyes: PERRLA, EOMs intact.  Sclera white.  Conjunctiva without erythema. ?Sinuses: No frontal/maxillary tenderness.  No nasal discharge. Nares patent.  ?ENT/Mouth: Ext aud canals clear.  Bilateral TMs w/DOL and without erythema or bulging. Hearing intact.  Posterior pharynx without swelling or exudate.  Tonsils without swelling or erythema.  ?Neck: Supple.  No masses, nodules or thyromegaly. ?Respiratory: Effort is regular with non-labored breathing. Breath sounds are equal bilaterally without rales, rhonchi, wheezing or stridor.  ?Cardio: RRR with no MRGs. Brisk peripheral pulses without edema.  ?Abdomen: Active BS in all four quadrants.  Soft and non-tender without guarding, rebound tenderness, hernias or masses. ?Lymphatics: Non tender without lymphadenopathy.  ?Musculoskeletal: Full ROM, 5/5 strength, normal ambulation.  No clubbing or cyanosis. ?Skin: Appropriate color for ethnicity. Warm without rashes, lesions, ecchymosis, ulcers.  ?Neuro: CN II-XII grossly normal. Normal muscle tone without cerebellar symptoms and intact sensation.   ?Psych: AO X 3,  appropriate mood and affect, insight and judgment.  ?  ? ?Darrol Jump, NP ?2:01 PM ?[facility] ? ?

## 2022-01-04 ENCOUNTER — Encounter: Payer: Self-pay | Admitting: Nurse Practitioner

## 2022-01-04 ENCOUNTER — Telehealth: Payer: Self-pay | Admitting: Nurse Practitioner

## 2022-01-04 ENCOUNTER — Other Ambulatory Visit: Payer: Self-pay | Admitting: Nurse Practitioner

## 2022-01-04 DIAGNOSIS — R300 Dysuria: Secondary | ICD-10-CM

## 2022-01-04 MED ORDER — CIPROFLOXACIN HCL 250 MG PO TABS: ORAL_TABLET | ORAL | 0 refills | Status: DC

## 2022-01-04 NOTE — Telephone Encounter (Signed)
Since starting the macrobid, having headaches and nausea. Wanting to know if there is something else she can try  ?

## 2022-01-05 LAB — URINALYSIS W MICROSCOPIC + REFLEX CULTURE
Bilirubin Urine: NEGATIVE
Glucose, UA: NEGATIVE
Hgb urine dipstick: NEGATIVE
Hyaline Cast: NONE SEEN /LPF
Ketones, ur: NEGATIVE
Nitrites, Initial: NEGATIVE
Protein, ur: NEGATIVE
RBC / HPF: NONE SEEN /HPF (ref 0–2)
Specific Gravity, Urine: 1.009 (ref 1.001–1.035)
Squamous Epithelial / HPF: NONE SEEN /HPF (ref <=5)
pH: 6 (ref 5.0–8.0)

## 2022-01-05 LAB — CULTURE INDICATED

## 2022-01-05 LAB — URINE CULTURE
MICRO NUMBER:: 13385194
SPECIMEN QUALITY:: ADEQUATE

## 2022-01-15 ENCOUNTER — Telehealth: Payer: Self-pay | Admitting: Nurse Practitioner

## 2022-01-15 NOTE — Telephone Encounter (Signed)
Patient states that the antibiotic for her UTI has caused a yeast infection now. She is requesting meds to be sent in to [facility], [place].

## 2022-01-16 ENCOUNTER — Other Ambulatory Visit: Payer: Self-pay | Admitting: Nurse Practitioner

## 2022-01-16 DIAGNOSIS — B3731 Acute candidiasis of vulva and vagina: Secondary | ICD-10-CM

## 2022-01-16 MED ORDER — FLUCONAZOLE 150 MG PO TABS: ORAL_TABLET | ORAL | 0 refills | Status: DC

## 2022-01-29 DIAGNOSIS — Z1151 Encounter for screening for human papillomavirus (HPV): Secondary | ICD-10-CM | POA: Diagnosis not present

## 2022-01-29 DIAGNOSIS — Z124 Encounter for screening for malignant neoplasm of cervix: Secondary | ICD-10-CM | POA: Diagnosis not present

## 2022-01-31 DIAGNOSIS — M8588 Other specified disorders of bone density and structure, other site: Secondary | ICD-10-CM | POA: Diagnosis not present

## 2022-01-31 DIAGNOSIS — N958 Other specified menopausal and perimenopausal disorders: Secondary | ICD-10-CM | POA: Diagnosis not present

## 2022-02-01 DIAGNOSIS — H5203 Hypermetropia, bilateral: Secondary | ICD-10-CM | POA: Diagnosis not present

## 2022-02-13 ENCOUNTER — Other Ambulatory Visit: Payer: Self-pay | Admitting: Hematology and Oncology

## 2022-02-22 ENCOUNTER — Ambulatory Visit: Payer: Medicare Other | Admitting: Hematology and Oncology

## 2022-02-28 NOTE — Progress Notes (Signed)
Patient Care Team: [name], MD as PCP - General (Internal Medicine) Minus Breeding, MD as PCP - Cardiology (Cardiology) [name], MD (Internal Medicine) Arta Silence, MD as Consulting Physician (Gastroenterology) Arvella Nigh, MD as Consulting Physician (Obstetrics and Gynecology) Nicholas Lose, MD as Consulting Physician (Hematology and Oncology) Eppie Gibson, MD as Attending Physician (Radiation Oncology) Excell Seltzer, MD (Inactive) as Consulting Physician (General Surgery)  DIAGNOSIS:  Encounter Diagnosis  Name Primary?   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  The STAT Breast cancer panel offered by [facility] includes sequencing and rearrangement analysis for the following 9 genes:  ATM, BRCA1, BRCA2, CDH1, CHEK2, PALB2, PTEN, STK11 and TP53.   The report date is March 09, 2017.  Re-requitisioned to the common hereditary cancer panel and the melanoma panel.  The additional genes tested were negative.  The Hereditary Gene Panel offered by [facility] includes sequencing and/or deletion duplication testing of the following 46 genes: APC, ATM, AXIN2, BARD1, BMPR1A, BRCA1, BRCA2, BRIP1, CDH1, CDKN2A (p14ARF), CDKN2A (p16INK4a), CHEK2, CTNNA1, DICER1, EPCAM (Deletion/duplication testing only),  GREM1 (promoter region deletion/duplication testing only), KIT, MEN1, MLH1, MSH2, MSH3, MSH6, MUTYH, NBN, NF1, NHTL1, PALB2, PDGFRA, PMS2, POLD1, POLE, PTEN, RAD50, RAD51C, RAD51D, SDHB, SDHC, SDHD, SMAD4, SMARCA4. STK11, TP53, TSC1, TSC2, and VHL.  The following genes were evaluated for sequence changes only: SDHA and HOXB13 c.251G>A variant only.  The Melanoma panel offered by [facility] includes sequencing and/or deletion duplication testing of the following 12 genes: BAP1, BRCA1, BRCA2, BRIP1, CDK4, CDKN2A (p14ARF), CDKN2A (p16INK4a), MC1R, POT1, PTEN, RB1, TERT, and TP53.  The following gene was evaluated for sequence changes only: MITF (c.952G>A, p.GLU318Lys variant only). The updated report date is March 14, 2017.   UPDATE:CHEK2 c.349A>G has been upgraded from a Likely Pathogenic variant to a Pathogenic variant.  The change in variant classification was made as a result of re-review of the evidence in light of new variant interpretation guidelines and/or new information. The updated report date is March 03, 2020.     03/19/2017 Surgery   Right lumpectomy: IDC with DCIS, 1.2 cm, grade 2, margins negative, 0/2 lymph nodes negative, ER 100%, PR 10%, HER-2 negative ratio 1.73, Ki-67 40% T1BN0 stage IA   04/01/2017 Oncotype testing   Oncotype Dx score: 41 (28% ROR with Tamoxifen alone)   04/16/2017 - 08/27/2017 Chemotherapy   Dose dense Adriamycin and Cytoxan 4 followed by Taxol weekly 12    09/24/2017 - 10/17/2017 Radiation Therapy   Adjuvant radiation   11/28/2017 -  Anti-estrogen oral therapy   Letrozole 2.5 mg daily     CHIEF COMPLIANT: Follow-up of right breast cancer on letrozole therapy    INTERVAL HISTORY: Rebecca [name] is a 66 y.o. with above-mentioned history of  right breast cancer currently on letrozole therapy. She presents to the clinic today for a  follow-up. States that she has leg pain down both legs. She said as long as she stay moving it doesn't bother her. She also have some  tenderness in the breast.   ALLERGIES:  is allergic to cholecalciferol, citalopram, dexamethasone, dilaudid [hydromorphone hcl], propoxycaine, red yeast rice [cholestin], rofecoxib, sulfa antibiotics, and sulfamethoxazole.  MEDICATIONS:  Current Outpatient Medications  Medication Sig Dispense Refill   Ibuprofen 200 MG CAPS Advil     letrozole (FEMARA) 2.5 MG tablet TAKE 1 TABLET BY MOUTH DAILY 90 tablet 3   LORazepam (ATIVAN) 0.5 MG tablet TAKE ONE TABLET BY MOUTH EVERY NIGHT AT BEDTIME AS NEEDED 30 tablet 3   No current facility-administered medications for this visit.    PHYSICAL EXAMINATION: ECOG PERFORMANCE STATUS: 1 - Symptomatic but completely ambulatory  Vitals:   03/01/22 0810  BP: 128/72  Pulse: 63  Resp: 16  Temp: 98.8 F (37.1 C)  SpO2: 98%   Filed Weights   03/01/22 0810  Weight: 168 lb 8 oz (76.4 kg)    BREAST: No palpable masses or nodules in either right or left breasts. No palpable axillary supraclavicular or infraclavicular adenopathy no breast tenderness or nipple discharge. (exam performed in the presence of a chaperone)  LABORATORY DATA:  I have reviewed the data as listed    Latest Ref Rng & Units 11/01/2021    2:49 PM 08/31/2021    7:44 AM 10/05/2020    2:57 PM  CMP  Glucose 65 - 99 mg/dL 85  95  81   BUN 7 - 25 mg/dL _0 Creatinine 0.50 - 1.05 mg/dL 0.53  0.60  0.88   Sodium 135 - 146 mmol/L 140  141  139   Potassium 3.5 - 5.3 mmol/L 4.2  4.1  4.4   Chloride 98 - 110 mmol/L 103  104  102   CO2 20 - 32 mmol/L 28   31   Calcium 8.6 - 10.4 mg/dL 9.4   9.5   Total Protein 6.1 - 8.1 g/dL 7.0   7.0   Total Bilirubin 0.2 - 1.2 mg/dL 0.7   0.9   AST 10 - 35 U/L 15   14   ALT 6 - 29 U/L 11   12     Lab Results  Component Value Date   WBC 7.5 11/01/2021   HGB 13.0 11/01/2021   HCT 39.1 11/01/2021   MCV 91.4 11/01/2021   PLT 202 11/01/2021   NEUTROABS 5,288 11/01/2021    ASSESSMENT & PLAN:  Carcinoma of upper-outer quadrant of right  breast in female, estrogen receptor positive ([place]) 03/19/2017: Right lumpectomy: IDC with DCIS, 1.2 cm, grade 2, margins negative, 0/2 lymph nodes negative, ER 100%, PR 10%, HER-2 negative ratio 1.73, Ki-67 40% T1BN0 stage IA CHEK 2 Mutation Positive Oncotype Dx score: 41 (28% ROR with Tamoxifen alone) Treatment plan: 1. Adjuvant chemotherapy with dose dense Adriamycin Cytoxan 4 followed by weekly Taxol 12 2. followed by adjuvant radiation 09/25/17-10/15/17 3. Followed by adjuvant antiestrogen therapy ----------------------------------------------------------------------------   Current treatment: Letrozole 2.5 mg daily started 11/26/2017 Letrozole toxicities: 1.  Depression towards the end of the day: Patient also has a boss who has not been kind towards her 2.  Muscle cramps: Discussed using tonic water and CBD oil  Patient has a grand child in [place] who is 48 years old.   Insomnia issues: I sent a prescription for diazepam.  She will use it sparingly.   Breast cancer surveillance: 1.  Breast exam 02/23/2021: Benign 2. Mammogram:  06/05/2021: Benign breast density category C she would like to do the next mammogram at [facility] so I sent them an order.    Return to clinic in 1 year for follow-up.     Orders Placed This Encounter  Procedures   MM Digital Screening    Standing Status:   Future    Standing Expiration Date:   03/01/2023    Order Specific Question:   Reason for Exam (SYMPTOM  OR DIAGNOSIS REQUIRED)    Answer:   Annual mammograms with H/O right breast cancer    Order Specific Question:   Preferred imaging location?    Answer:   External    Comments:   [facility]    Order Specific Question:   Release to patient    Answer:   Immediate   The patient has a good understanding of the overall plan. she agrees with it. she will call with any problems that may develop before the next visit here. Total time spent: 30 mins including face to face time and time spent for planning, charting  and co-ordination of care   Harriette Ohara, MD 03/01/22    I Gardiner Coins am scribing for Dr. Lindi Adie  I have reviewed the above documentation for accuracy and completeness, and I agree with the above.

## 2022-03-01 ENCOUNTER — Other Ambulatory Visit: Payer: Self-pay

## 2022-03-01 ENCOUNTER — Inpatient Hospital Stay: Payer: Medicare Other | Attending: Hematology and Oncology | Admitting: Hematology and Oncology

## 2022-03-01 DIAGNOSIS — C50411 Malignant neoplasm of upper-outer quadrant of right female breast: Secondary | ICD-10-CM | POA: Diagnosis not present

## 2022-03-01 DIAGNOSIS — Z170 Estrogen receptor positive status [ER+]: Secondary | ICD-10-CM | POA: Diagnosis not present

## 2022-03-01 DIAGNOSIS — Z79811 Long term (current) use of aromatase inhibitors: Secondary | ICD-10-CM | POA: Diagnosis not present

## 2022-03-01 MED ORDER — LORAZEPAM 0.5 MG PO TABS: ORAL_TABLET | ORAL | 3 refills | Status: DC

## 2022-03-01 NOTE — Assessment & Plan Note (Addendum)
03/19/2017: Right lumpectomy: IDC with DCIS, 1.2 cm, grade 2, margins negative, 0/2 lymph nodes negative, ER 100%, PR 10%, HER-2 negative ratio 1.73, Ki-67 40% T1BN0 stage IA CHEK 2 Mutation Positive Oncotype Dx score: 41 (28% ROR with Tamoxifen alone) Treatment plan: 1. Adjuvant chemotherapy with dose dense Adriamycin Cytoxan 4 followed by weekly Taxol 12 2. followed by adjuvant radiation1/31/19-2/20/19 3. Followed by adjuvant antiestrogen therapy ----------------------------------------------------------------------------  Current treatment: Letrozole 2.5 mg daily started 11/26/2017 Letrozole toxicities: 1.Depression towards the end of the day: Patient also has a boss who has not been kind towards her 2.Muscle cramps: Discussed using tonic water and CBD oil  Patient has a grand child in [place] who is 20 years old.  Insomnia issues: I sent a prescription for diazepam.  She will use it sparingly.  Breast cancer surveillance: 1.Breast exam 02/23/2021: Benign 2.Mammogram:  06/05/2021: Benign breast density category C she would like to do the next mammogram at [facility] so I sent them an order.   Return to clinic in 1 year for follow-up.

## 2022-06-14 DIAGNOSIS — Z1231 Encounter for screening mammogram for malignant neoplasm of breast: Secondary | ICD-10-CM | POA: Diagnosis not present

## 2022-06-20 ENCOUNTER — Encounter: Payer: Self-pay | Admitting: Hematology and Oncology

## 2022-09-17 ENCOUNTER — Other Ambulatory Visit: Payer: Self-pay

## 2022-09-17 ENCOUNTER — Encounter: Payer: Self-pay | Admitting: Nurse Practitioner

## 2022-09-17 ENCOUNTER — Ambulatory Visit (INDEPENDENT_AMBULATORY_CARE_PROVIDER_SITE_OTHER): Payer: Medicare Other | Admitting: Nurse Practitioner

## 2022-09-17 VITALS — HR 72 | Temp 97.3°F

## 2022-09-17 DIAGNOSIS — R6889 Other general symptoms and signs: Secondary | ICD-10-CM

## 2022-09-17 DIAGNOSIS — Z1152 Encounter for screening for COVID-19: Secondary | ICD-10-CM | POA: Diagnosis not present

## 2022-09-17 LAB — POC COVID19 BINAXNOW: SARS Coronavirus 2 Ag: POSITIVE — AB

## 2022-09-17 LAB — POCT INFLUENZA A/B
Influenza A, POC: NEGATIVE
Influenza B, POC: NEGATIVE

## 2022-09-17 MED ORDER — PREDNISONE 10 MG PO TABS: ORAL_TABLET | ORAL | 0 refills | Status: DC

## 2022-09-17 MED ORDER — AZITHROMYCIN 500 MG PO TABS: 500.0000 mg | ORAL_TABLET | Freq: Every day | ORAL | 0 refills | Status: AC

## 2022-09-17 NOTE — Progress Notes (Signed)
THIS ENCOUNTER IS A VIRTUAL VISIT DUE TO COVID-19 - PATIENT WAS NOT SEEN IN THE OFFICE.  PATIENT HAS CONSENTED TO VIRTUAL VISIT / TELEMEDICINE VISIT   Virtual Visit via telephone Note  I connected with  Rebecca [name] on 09/17/2022 by telephone.  I verified that I am speaking with the correct person using two identifiers.    I discussed the limitations of evaluation and management by telemedicine and the availability of in person appointments. The patient expressed understanding and agreed to proceed.  History of Present Illness:  Pulse 72   Temp (!) 97.3 F (36.3 C)   LMP 08/03/2013   SpO2 98%  67 y.o. patient contacted office reporting URI sx congestion cough HA. she tested positive by rapid Covid test at office. OV was conducted by telephone to minimize exposure. This patient has vaccinated for covid 19, last boosted recently (can't remember the date and not on file).   Sx began 5 days ago with cough  Treatments tried so far: Alka Seltzer Plus, Mucinex, Vicks Vapor Rub, Robitusson, Neti Rince  Exposures: Unknown   Medications     Current Outpatient Medications (Analgesics):    Ibuprofen 200 MG CAPS, Advil   Current Outpatient Medications (Other):    letrozole (FEMARA) 2.5 MG tablet, TAKE 1 TABLET BY MOUTH DAILY   LORazepam (ATIVAN) 0.5 MG tablet, TAKE ONE TABLET BY MOUTH EVERY NIGHT AT BEDTIME AS NEEDED  Allergies:  Allergies  Allergen Reactions   Cholecalciferol Other (See Comments)    Headache   Citalopram Other (See Comments)    HYPERACTIVITY   Dexamethasone     Chest tightness   Dilaudid [Hydromorphone Hcl] Nausea And Vomiting   Propoxycaine Nausea And Vomiting   Red Yeast Rice [Cholestin] Other (See Comments)    headache   Rofecoxib Nausea And Vomiting and Nausea Only   Sulfa Antibiotics Swelling   Sulfamethoxazole Swelling    Problem list She has Hyperlipidemia; Vitamin D deficiency; Migraine; Family history of breast cancer; Family history of  melanoma; Family history of colon cancer; Family history of brain cancer; 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   Social History:   reports that she quit smoking about 14 years ago. Her smoking use included cigarettes. She has never used smokeless tobacco. She reports that she does not drink alcohol and does not use drugs.  Observations/Objective:  General : Well sounding patient in no apparent distress HEENT: no hoarseness, no cough for duration of visit Lungs: speaks in complete sentences, no audible wheezing, no apparent distress Neurological: alert, oriented x 3 Psychiatric: pleasant, judgement appropriate   Assessment and Plan:  Covid 19 Covid 19 positive per rapid screening test at office  Risk factors include: Breast Ca,HLD Symptoms are: mild Due to co morbid conditions and risk factors, discussed antivirals  Immue support reviewed Vitamin D, Zinc, Take tylenol PRN temp 101+ Push hydration Regular ambulation or calf exercises exercises for clot prevention and 81 mg ASA unless contraindicated Sx supportive therapy suggested Follow up via mychart or telephone if needed Advised patient obtain O2 monitor; present to ED if persistently <90% or with severe dyspnea, CP, fever uncontrolled by tylenol, confusion, sudden decline       Should remain in isolation 5 days from testing positive and then wear a mask  when around other people for the following 5 days  1. Flu-like symptoms Negative  - POCT Influenza A/B  2. Encounter for screening for COVID-19 Positive  - POC COVID-19 - azithromycin (ZITHROMAX) 500 MG tablet; Take 1 tablet (500 mg total) by mouth daily for 10 days.  Dispense: 10 tablet;  Refill: 0   Follow Up Instructions:  I discussed the assessment and treatment plan with the patient. The patient was provided an opportunity to ask questions and all were answered. The patient agreed with the plan and demonstrated an understanding of the instructions.   The patient was advised to call back or seek an in-person evaluation if the symptoms worsen or if the condition fails to improve as anticipated.  I provided 15 minutes of non-face-to-face time during this encounter.   Darrol Jump, NP

## 2022-09-18 ENCOUNTER — Ambulatory Visit: Payer: Medicare Other | Admitting: Nurse Practitioner

## 2022-09-25 ENCOUNTER — Other Ambulatory Visit: Payer: Self-pay | Admitting: Hematology and Oncology

## 2022-09-26 DIAGNOSIS — K080 Exfoliation of teeth due to systemic causes: Secondary | ICD-10-CM | POA: Diagnosis not present

## 2022-10-08 MED ORDER — AZITHROMYCIN 500 MG PO TABS: 500.0000 mg | ORAL_TABLET | Freq: Every day | ORAL | 0 refills | Status: AC

## 2022-10-17 DIAGNOSIS — K080 Exfoliation of teeth due to systemic causes: Secondary | ICD-10-CM | POA: Diagnosis not present

## 2022-10-31 DIAGNOSIS — Z6827 Body mass index (BMI) 27.0-27.9, adult: Secondary | ICD-10-CM | POA: Diagnosis not present

## 2022-10-31 DIAGNOSIS — Z01419 Encounter for gynecological examination (general) (routine) without abnormal findings: Secondary | ICD-10-CM | POA: Diagnosis not present

## 2022-11-01 ENCOUNTER — Ambulatory Visit (INDEPENDENT_AMBULATORY_CARE_PROVIDER_SITE_OTHER): Payer: Medicare Other | Admitting: Nurse Practitioner

## 2022-11-01 ENCOUNTER — Encounter: Payer: Self-pay | Admitting: Nurse Practitioner

## 2022-11-01 VITALS — BP 128/72 | HR 80 | Temp 98.8°F | Ht 66.0 in | Wt 171.2 lb

## 2022-11-01 DIAGNOSIS — R053 Chronic cough: Secondary | ICD-10-CM

## 2022-11-01 DIAGNOSIS — J302 Other seasonal allergic rhinitis: Secondary | ICD-10-CM | POA: Diagnosis not present

## 2022-11-01 DIAGNOSIS — U099 Post covid-19 condition, unspecified: Secondary | ICD-10-CM

## 2022-11-01 NOTE — Progress Notes (Signed)
Assessment and Plan:  Rebecca [name] was seen today for an episodic visit.  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 Coughing can be cuased by several factors including   breathing in things that bother (irritate) your lungs.  Allergies.  Asthma.  Mucus that runs down the back of your throat (postnasal drip).  Smoking/smoke.  Acid backing up from the stomach into the tube that moves food from the mouth to the stomach (gastroesophageal reflux). A cough can linger for 3 weeks. Watch for any changes in your cough and contact office if noticed including blood, pus, pain, night sweats. Cover your mouth when you cough. If the air is dry, use a cool mist vaporizer or humidifier in your home. If your cough is worse at night, try using extra pillows to raise your head up higher while you sleep. Call 911 or report to ER if you start to have difficulty breathing.   - DG Chest 2 View; Future  Seasonal allergies Will aim to eliminate allergy trigger as underlying cause. Patient provided Zyrtec 10 mg samples Instructed to take daily as directed Continue to monitor  Notify office for further evaluation and treatment, questions or concerns if s/s fail to improve. The risks and benefits of my recommendations, as well as other treatment options were discussed with the patient today. Questions were answered.  Further disposition pending results of labs. Discussed med's effects and SE's.    Over 15 minutes of exam, counseling, chart review, and critical decision making was performed.   Future Appointments  Date Time Provider [place]  11/08/2022 10:00 AM Darrol Jump, NP [facility] None  03/06/2023  2:00 PM Nicholas Lose, MD [facility] None     ------------------------------------------------------------------------------------------------------------------   HPI BP 128/72   Pulse 80   Temp 98.8 F (37.1 C)   Ht '5\' 6"'$  (1.676 m)   Wt 171 lb 3.2 oz (77.7 kg)   LMP 08/03/2013   SpO2 99%   BMI 27.63 kg/m   Patient complains of productive cough. Symptoms began 6 weeks ago. Symptoms have been unchanged since that time.The cough is productive with clear sputum and is aggravated by nothing. Patient has no other associated symptoms. Patient does not have new pets. Patient does not have a history of asthma. Patient does not have a history of environmental allergens. Patient has not traveled recently. Patient does have a history of smoking, quit date 08/2008. Patient has not had a previous chest x-ray.  Covid positive 09/17/22.  Past Medical History:  Diagnosis Date   Depression    REMISSION/ SITUATIONAL   Dyspnea    Hepatitis AGE 31   RESOLVED   History of kidney stones    History of radiation therapy 09/24/17- 10/15/17   Right Breast treated to 42.56 Gy with 16 fx of 2.66 Gy   Hyperlipidemia    Insomnia    Migraines    HORMONAL   Personal history of chemotherapy    Personal history of radiation therapy    PONV (postoperative nausea and vomiting)    right breast ca dx' d 02/2017   Shingles outbreak 2003, 2015   RIGHT HIP, and between breasts 3 years ago     Allergies  Allergen Reactions   Cholecalciferol Other (See Comments)    Headache   Citalopram Other (See Comments)    HYPERACTIVITY   Dexamethasone  Chest tightness   Dilaudid [Hydromorphone Hcl] Nausea And Vomiting   Propoxycaine Nausea And Vomiting   Red Yeast Rice [Cholestin] Other (See Comments)    headache   Rofecoxib Nausea And Vomiting and Nausea Only   Sulfa Antibiotics Swelling   Sulfamethoxazole Swelling    Current Outpatient Medications on File Prior to Visit  Medication Sig   Ibuprofen 200 MG CAPS Advil   letrozole (FEMARA) 2.5 MG tablet  TAKE ONE TABLET BY MOUTH DAILY   LORazepam (ATIVAN) 0.5 MG tablet TAKE ONE TABLET BY MOUTH EVERY NIGHT AT BEDTIME AS NEEDED (Patient not taking: Reported on 11/01/2022)   predniSONE (DELTASONE) 10 MG tablet 1 tab 3 x day for 2 days, then 1 tab 2 x day for 2 days, then 1 tab 1 x day for 3 days   No current facility-administered medications on file prior to visit.    ROS: all negative except what is noted in the HPI.   Physical Exam:  BP 128/72   Pulse 80   Temp 98.8 F (37.1 C)   Ht '5\' 6"'$  (1.676 m)   Wt 171 lb 3.2 oz (77.7 kg)   LMP 08/03/2013   SpO2 99%   BMI 27.63 kg/m   General Appearance: NAD.  Awake, conversant and cooperative. Eyes: PERRLA, EOMs intact.  Sclera white.  Conjunctiva without erythema. Sinuses: No frontal/maxillary tenderness.  No nasal discharge. Nares patent.  ENT/Mouth: Ext aud canals clear.  Bilateral TMs w/DOL and without erythema or bulging. Hearing intact.  Posterior pharynx without swelling or exudate.  Tonsils without swelling or erythema.  Neck: Supple.  No masses, nodules or thyromegaly. Respiratory: Effort is regular with non-labored breathing. Breath sounds are equal bilaterally without rales, rhonchi, wheezing or stridor.  Cardio: RRR with no MRGs. Brisk peripheral pulses without edema.  Abdomen: Active BS in all four quadrants.  Soft and non-tender without guarding, rebound tenderness, hernias or masses. Lymphatics: Non tender without lymphadenopathy.  Musculoskeletal: Full ROM, 5/5 strength, normal ambulation.  No clubbing or cyanosis. Skin: Appropriate color for ethnicity. Warm without rashes, lesions, ecchymosis, ulcers.  Neuro: CN II-XII grossly normal. Normal muscle tone without cerebellar symptoms and intact sensation.   Psych: AO X 3,  appropriate mood and affect, insight and judgment.     Darrol Jump, NP 11:57 AM [facility]

## 2022-11-01 NOTE — Patient Instructions (Signed)
Cough, Adult Coughing is a reflex that clears your throat and airways (respiratory system). It helps heal and protect your lungs. It is normal to cough from time to time. A cough that happens with other symptoms or that lasts a long time may be a sign of a condition that needs treatment. A short-term (acute) cough may only last 2-3 weeks. A long-term (chronic) cough may last 8 or more weeks. Coughing is often caused by: Diseases, such as: An infection of the respiratory system. Asthma or other heart or lung diseases. Gastroesophageal reflux. This is when acid comes back up from the stomach. Breathing in things that irritate your lungs. Allergies. Postnasal drip. This is when mucus runs down the back of your throat. Smoking. Some medicines. Follow these instructions at home: Medicines Take over-the-counter and prescription medicines only as told by your health care provider. Talk with your provider before you take cough medicine (cough suppressants). Eating and drinking Do not drink alcohol. Avoid caffeine. Drink enough fluid to keep your pee (urine) pale yellow. Lifestyle Avoid cigarette smoke. Do not use any products that contain nicotine or tobacco. These products include cigarettes, chewing tobacco, and vaping devices, such as e-cigarettes. If you need help quitting, ask your provider. Avoid things that make you cough. These may include perfumes, candles, cleaning products, or campfire smoke. General instructions  Watch for any changes to your cough. Tell your provider about them. Always cover your mouth when you cough. If the air is dry in your bedroom or home, use a cool mist vaporizer or humidifier. If your cough is worse at night, try to sleep in a semi-upright position. Rest as needed. Contact a health care provider if: You have new symptoms, or your symptoms get worse. You cough up pus. You have a fever that does not go away or a cough that does not get better after 2-3  weeks. You cannot control your cough with medicine, and you are losing sleep. You have pain that gets worse or is not helped with medicine. You lose weight for no clear reason. You have night sweats. Get help right away if: You cough up blood. You have trouble breathing. Your heart is beating very fast. These symptoms may be an emergency. Get help right away. Call 911. Do not wait to see if the symptoms will go away. Do not drive yourself to the hospital. This information is not intended to replace advice given to you by your health care provider. Make sure you discuss any questions you have with your health care provider. Document Revised: 04/12/2022 Document Reviewed: 04/12/2022 Elsevier Patient Education  2023 [employer]  

## 2022-11-04 ENCOUNTER — Encounter: Payer: Medicare Other | Admitting: Nurse Practitioner

## 2022-11-04 ENCOUNTER — Encounter: Payer: Self-pay | Admitting: Nurse Practitioner

## 2022-11-06 DIAGNOSIS — R918 Other nonspecific abnormal finding of lung field: Secondary | ICD-10-CM | POA: Diagnosis not present

## 2022-11-06 DIAGNOSIS — U099 Post covid-19 condition, unspecified: Secondary | ICD-10-CM | POA: Diagnosis not present

## 2022-11-07 ENCOUNTER — Encounter: Payer: Self-pay | Admitting: Nurse Practitioner

## 2022-11-08 ENCOUNTER — Encounter: Payer: Self-pay | Admitting: Nurse Practitioner

## 2022-11-08 ENCOUNTER — Ambulatory Visit (INDEPENDENT_AMBULATORY_CARE_PROVIDER_SITE_OTHER): Payer: Medicare Other | Admitting: Nurse Practitioner

## 2022-11-08 VITALS — BP 140/82 | HR 73 | Temp 97.3°F | Ht 66.0 in | Wt 171.4 lb

## 2022-11-08 DIAGNOSIS — E538 Deficiency of other specified B group vitamins: Secondary | ICD-10-CM

## 2022-11-08 DIAGNOSIS — R6889 Other general symptoms and signs: Secondary | ICD-10-CM | POA: Diagnosis not present

## 2022-11-08 DIAGNOSIS — E663 Overweight: Secondary | ICD-10-CM

## 2022-11-08 DIAGNOSIS — C50919 Malignant neoplasm of unspecified site of unspecified female breast: Secondary | ICD-10-CM

## 2022-11-08 DIAGNOSIS — Z170 Estrogen receptor positive status [ER+]: Secondary | ICD-10-CM

## 2022-11-08 DIAGNOSIS — I10 Essential (primary) hypertension: Secondary | ICD-10-CM

## 2022-11-08 DIAGNOSIS — Z136 Encounter for screening for cardiovascular disorders: Secondary | ICD-10-CM

## 2022-11-08 DIAGNOSIS — R7309 Other abnormal glucose: Secondary | ICD-10-CM | POA: Diagnosis not present

## 2022-11-08 DIAGNOSIS — Z1329 Encounter for screening for other suspected endocrine disorder: Secondary | ICD-10-CM

## 2022-11-08 DIAGNOSIS — G4700 Insomnia, unspecified: Secondary | ICD-10-CM

## 2022-11-08 DIAGNOSIS — Z79899 Other long term (current) drug therapy: Secondary | ICD-10-CM | POA: Diagnosis not present

## 2022-11-08 DIAGNOSIS — R058 Other specified cough: Secondary | ICD-10-CM | POA: Diagnosis not present

## 2022-11-08 DIAGNOSIS — E559 Vitamin D deficiency, unspecified: Secondary | ICD-10-CM | POA: Diagnosis not present

## 2022-11-08 DIAGNOSIS — Z0001 Encounter for general adult medical examination with abnormal findings: Secondary | ICD-10-CM | POA: Diagnosis not present

## 2022-11-08 DIAGNOSIS — E785 Hyperlipidemia, unspecified: Secondary | ICD-10-CM

## 2022-11-08 DIAGNOSIS — Z808 Family history of malignant neoplasm of other organs or systems: Secondary | ICD-10-CM

## 2022-11-08 DIAGNOSIS — Z131 Encounter for screening for diabetes mellitus: Secondary | ICD-10-CM

## 2022-11-08 DIAGNOSIS — R0981 Nasal congestion: Secondary | ICD-10-CM | POA: Diagnosis not present

## 2022-11-08 DIAGNOSIS — Z1389 Encounter for screening for other disorder: Secondary | ICD-10-CM

## 2022-11-08 DIAGNOSIS — G43809 Other migraine, not intractable, without status migrainosus: Secondary | ICD-10-CM

## 2022-11-08 DIAGNOSIS — R0989 Other specified symptoms and signs involving the circulatory and respiratory systems: Secondary | ICD-10-CM

## 2022-11-08 DIAGNOSIS — Z6827 Body mass index (BMI) 27.0-27.9, adult: Secondary | ICD-10-CM

## 2022-11-08 MED ORDER — ALBUTEROL SULFATE HFA 108 (90 BASE) MCG/ACT IN AERS: 2.0000 | INHALATION_SPRAY | Freq: Four times a day (QID) | RESPIRATORY_TRACT | 2 refills | Status: DC | PRN

## 2022-11-08 MED ORDER — IPRATROPIUM-ALBUTEROL 0.5-2.5 (3) MG/3ML IN SOLN: 3.0000 mL | Freq: Once | RESPIRATORY_TRACT | Status: DC

## 2022-11-08 MED ORDER — MONTELUKAST SODIUM 10 MG PO TABS: ORAL_TABLET | ORAL | 3 refills | Status: DC

## 2022-11-08 NOTE — Progress Notes (Signed)
COMPLETE PHYSICAL  Assessment and Plan:  Encounter for general adult medical examination with abnormal findings Due annually Health maintenance reviewed  Hyperlipidemia, unspecified hyperlipidemia type Discussed lifestyle modifications. Recommended diet heavy in fruits and veggies, omega 3's. Decrease consumption of animal meats, cheeses, and dairy products. Remain active and exercise as tolerated. Continue to monitor. Check lipids/TSH   Abnormal glucose Education: Reviewed 'ABCs' of diabetes management  Discussed goals to be met and/or maintained include A1C (<7) Blood pressure (<130/80) Cholesterol (LDL <70) Continue Eye Exam yearly  Continue Dental Exam Q6 mo Discussed dietary recommendations Discussed Physical Activity recommendations Check A1C  Vitamin D deficiency Continue supplement for goal of 60-100 Monitor Vitamin D levels  Family history of melanoma Monitor skin - follow dermatology as directed.  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 Lindi Adie again Continue on Femara Continue self breast exams and mammograms.  Overweight Discussed appropriate BMI Diet modification. Physical activity. Encouraged/praised to build confidence.  Screening, ischemic heart disease -     EKG 12-Lead  Screening for blood or protein in urine -     Urinalysis w microscopic + reflex cultue - Microalbumin/creatinine urine ratio  Screening for thyroid disorder -     TSH   Insomnia, unspecified type -     TSH Limit use of Lorazepam 0.5mg   Post viral cough syndrome/hyperinflation/nasal congestion Start albuterol inhaler as directed as needed Start Montelukast as directed.  Du-oneb administered - tolerated well.  Inhaler teaching completed All  questions and concerns addressed Report to ER or call 911 for any increase in SOB or difficulty breathing. Discussed referral to Pulmonology fur further review and evaluation if s/s fail to improve.   Orders Placed This Encounter  Procedures   CBC with Differential/Platelet   COMPLETE METABOLIC PANEL WITH GFR   Magnesium   TSH   Lipid panel   Hemoglobin A1c   Insulin, random   Urinalysis, Routine w reflex microscopic   Microalbumin / creatinine urine ratio   Vitamin B12   VITAMIN D 25 Hydroxy (Vit-D Deficiency, Fractures)   EKG 12-Lead    Notify office for further evaluation and treatment, questions or concerns if any reported s/s fail to improve.   The patient was advised to call back or seek an in-person evaluation if any symptoms worsen or if the condition fails to improve as anticipated.   Further disposition pending results of labs. Discussed med's effects and SE's.    I discussed the assessment and treatment plan with the patient. The patient was provided an opportunity to ask questions and all were answered. The patient agreed with the plan and demonstrated an understanding of the instructions.  Discussed med's effects and SE's. Screening labs and tests as requested with regular follow-up as recommended.  I provided 40 minutes of face-to-face time during this encounter including counseling, chart review, and critical decision making was preformed.  Today's Plan of Care is based on a patient-centered health care approach known as shared decision making - the decisions, tests and treatments allow for patient preferences and values to be balanced with clinical evidence.     Future Appointments  Date Time Provider [place]  03/06/2023  2:00 PM Nicholas Lose, MD [facility] None  11/10/2023 10:00 AM Darrol Jump, NP [facility] None    HPI  67 y.o. female  presents for a complete physical.  She is concened with a post viral cough lasting for >3 months.  Had  Covid 09/17/22.  Completed CXR which revealed hyperinflation 11/01/22, no active airway disease, She is continuing to have lingering cough, non-productive.  Cough is throughout the day and night.  Denies any other associated symptoms.  History of smoking 15 years ago, she smoked for 25 years and just on weekends.   She also follows with Dr. 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  Initial diagnosis 02/2017.  Had right lumpectomy 03/19/2017.  Treated with Adriamycin, Cytoxan and Taxol, adjuvant radiation (09/25/2017 - 10/15/2017).  Started on Letrozole 11/28/2017. Last f/u 03/01/22.  Some associated leg cramping and depression.  She continues CBD and has a boss how is kind and understanding of disease.  She continues to follow up yearly.   She followed up with [facility] 02/2022 for annual skin check d/t family hx melanoma.  No concerns.    Her blood pressure has been controlled at home, today their BP is BP: (!) 140/82  BP Readings from Last 3 Encounters:  11/08/22 (!) 140/82  11/01/22 128/72  03/01/22 128/72  She does workout. She denies chest pain,  dizziness.   She has a history of kidney stones with last treatment through [facility] of laser. Has had persistent pain and still has several kidney stones.She continues to follow with urology and returns next week for further evaluation.   She stopped her menstrual cycle at age 35. She was found to have uterine fibroids on CT. She is not having any current abdominal pain or dysfunctional bleeding.    Today: BMI is Body mass index is 27.66 kg/m., she is working on diet and exercise. Wt Readings from Last 3 Encounters:  11/08/22 171 lb 6.4 oz (77.7 kg)  11/01/22 171 lb 3.2 oz (77.7 kg)  03/01/22 168 lb 8 oz (76.4 kg)   She is on ativan PRN for sleep, takes every 2 weeks but does not help.  States she needs 4-5 hours of sleep a night and feels rested with that.   She is on  cholesterol medication, not fasting and denies myalgias. Her cholesterol is not at goal. The cholesterol last visit was:  Lab Results  Component Value Date   CHOL 219 (H) 11/01/2021   HDL 59 11/01/2021   LDLCALC 136 (H) 11/01/2021   TRIG 120 11/01/2021   CHOLHDL 3.7 11/01/2021  . She has been working on diet and exercise for prediabetes,  and denies foot ulcerations, hyperglycemia, hypoglycemia , increased appetite, nausea, paresthesia of the feet, polydipsia, polyuria, visual disturbances, vomiting and weight loss. Last A1C in the office was:  Lab Results  Component Value Date   HGBA1C 5.5 11/01/2021   Patient is on Vitamin D supplement, she can not tolerate Vitamin D supplementation, has tried all forms but they give her a headache.  Lab Results  Component Value Date   VD25OH 37 11/01/2021       Current Medications:  Current Outpatient Medications on File Prior to Visit  Medication Sig Dispense Refill   Ibuprofen 200 MG CAPS Advil     letrozole (FEMARA) 2.5 MG tablet TAKE ONE TABLET BY MOUTH DAILY 90 tablet 3   LORazepam (ATIVAN) 0.5 MG tablet TAKE ONE TABLET BY MOUTH EVERY NIGHT AT BEDTIME AS NEEDED 30 tablet 3   No current facility-administered medications on file prior to visit.    Health Maintenance:   Immunization History  Administered  Date(s) Administered   Influenza Inj Mdck Quad With Preservative 06/26/2018   Influenza,inj,Quad PF,6+ Mos 06/25/2017   Td 08/27/2007   Tdap 03/16/2018   Tetanus: 2019 Flu vaccine:2018 Pap: 09/2021 [facility]  05/2022 DEXA: Dr. Ophelia Charter 2019 AT [facility] Colonoscopy: 2019, outlaw due 2024 -has scheduled Last Eye Exam: [facility] Echo 2018 Dental Exam: D[facility]   Patient Care Team: [name], MD as PCP - General (Internal Medicine) Minus Breeding, MD as PCP - Cardiology (Cardiology) [name], MD (Internal Medicine) Arta Silence, MD as Consulting Physician (Gastroenterology) Arvella Nigh, MD as Consulting Physician (Obstetrics and  Gynecology) Nicholas Lose, MD as Consulting Physician (Hematology and Oncology) Eppie Gibson, MD as Attending Physician (Radiation Oncology) Excell Seltzer, MD (Inactive) as Consulting Physician (General Surgery)  Medical History:  Past Medical History:  Diagnosis Date   Depression    REMISSION/ SITUATIONAL   Dyspnea    Hepatitis AGE 72   RESOLVED   History of kidney stones    History of radiation therapy 09/24/17- 10/15/17   Right Breast treated to 42.56 Gy with 16 fx of 2.66 Gy   Hyperlipidemia    Insomnia    Migraines    HORMONAL   Personal history of chemotherapy    Personal history of radiation therapy    PONV (postoperative nausea and vomiting)    right breast ca dx' d 02/2017   Shingles outbreak 2003, 2015   RIGHT HIP, and between breasts 3 years ago    Allergies Allergies  Allergen Reactions   Cholecalciferol Other (See Comments)    Headache   Citalopram Other (See Comments)    HYPERACTIVITY   Dexamethasone     Chest tightness   Dilaudid [Hydromorphone Hcl] Nausea And Vomiting   Propoxycaine Nausea And Vomiting   Red Yeast Rice [Cholestin] Other (See Comments)    headache   Rofecoxib Nausea And Vomiting and Nausea Only   Sulfa Antibiotics Swelling   Sulfamethoxazole Swelling    SURGICAL HISTORY She  has a past surgical history that includes Knee arthroscopy (Right, 2002); Tubal ligation; uterine polyps (2016); Breast lumpectomy with radioactive seed and sentinel lymph node biopsy (Right, 03/19/2017); Breast lumpectomy (Right, 02/2017); Breast excisional biopsy (Right); Cystoscopy with retrograde pyelogram, ureteroscopy and stent placement (Bilateral, 08/31/2021); and Holmium laser application (Bilateral, 08/31/2021). FAMILY HISTORY Her family history includes Breast cancer in her sister; Cancer in her brother and paternal grandfather; Cancer (age of onset: 88) in her sister; Cancer (age of onset: 28) in her father; Cervical cancer in her maternal aunt; Dementia  in her maternal aunt and mother; Depression in her brother; Heart disease in her maternal grandfather; Lung cancer in her maternal uncle; Lung cancer (age of onset: 36) in her brother; Other in her paternal grandmother; Stroke (age of onset: 23) in her maternal grandmother. SOCIAL HISTORY She  reports that she quit smoking about 14 years ago. Her smoking use included cigarettes. She has never used smokeless tobacco. She reports that she does not drink alcohol and does not use drugs. 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    Review of Systems: Review of Systems  Constitutional:  Negative for chills, fever and malaise/fatigue.  HENT:  Negative for congestion, ear pain and sore throat.   Eyes: Negative.  Negative for blurred vision and double vision.  Respiratory:  Positive for shortness of breath. Negative for cough, hemoptysis, sputum production and wheezing.   Cardiovascular:  Negative for chest pain, palpitations, orthopnea, claudication, leg swelling and PND.  Gastrointestinal:  Negative for  abdominal pain, blood in stool, constipation, diarrhea, heartburn and melena.  Musculoskeletal:  Negative for back pain, joint pain and neck pain.  Skin: Negative.   Neurological:  Negative for dizziness, sensory change, loss of consciousness and headaches.  Psychiatric/Behavioral:  Negative for depression, hallucinations, substance abuse and suicidal ideas. The patient is nervous/anxious. The patient does not have insomnia.     Physical Exam: Estimated body mass index is 27.66 kg/m as calculated from the following:   Height as of this encounter: 5\' 6"  (1.676 m).   Weight as of this encounter: 171 lb 6.4 oz (77.7 kg). BP (!) 140/82   Pulse 73   Temp (!) 97.3 F (36.3 C)   Ht 5\' 6"  (1.676 m)   Wt 171 lb 6.4 oz (77.7 kg)   LMP 08/03/2013   SpO2 97%   BMI 27.66 kg/m   General Appearance: Well nourished well developed, in no apparent distress.  Eyes: PERRLA, EOMs, conjunctiva no  swelling or erythema ENT/Mouth: Ear canals normal without obstruction, swelling, erythema, or discharge.  TMs normal bilaterally with no erythema, bulging, retraction, or loss of landmark.  Oropharynx moist and clear with no exudate, erythema, or swelling.   Neck: Supple, thyroid normal. No bruits.  No cervical adenopathy Respiratory: Respiratory effort normal, + wheezing RML without rhonchi, rales.   Cardio: RRR without murmurs, rubs or gallops. Brisk peripheral pulses without edema.  Chest: symmetric, with normal excursions Abdomen: Soft, nontender, no guarding, rebound, hernias, masses, or organomegaly.  Lymphatics: Non tender without lymphadenopathy.  Musculoskeletal: Full ROM all peripheral extremities,5/5 strength, and normal gait.  Skin: Warm, dry without rashes, lesions, ecchymosis. Neuro: Awake and oriented X 3, Cranial nerves intact, reflexes equal bilaterally. Normal muscle tone, no cerebellar symptoms. Sensation intact.  Psych:  normal affect, Insight and Judgment appropriate.   EKG: WNL, no ST changes   Darrol Jump, NP 10:31 AM [facility]

## 2022-11-08 NOTE — Patient Instructions (Signed)
Cough, Adult Coughing is a reflex that clears your throat and airways (respiratory system). It helps heal and protect your lungs. It is normal to cough from time to time. A cough that happens with other symptoms or that lasts a long time may be a sign of a condition that needs treatment. A short-term (acute) cough may only last 2-3 weeks. A long-term (chronic) cough may last 8 or more weeks. Coughing is often caused by: Diseases, such as: An infection of the respiratory system. Asthma or other heart or lung diseases. Gastroesophageal reflux. This is when acid comes back up from the stomach. Breathing in things that irritate your lungs. Allergies. Postnasal drip. This is when mucus runs down the back of your throat. Smoking. Some medicines. Follow these instructions at home: Medicines Take over-the-counter and prescription medicines only as told by your health care provider. Talk with your provider before you take cough medicine (cough suppressants). Eating and drinking Do not drink alcohol. Avoid caffeine. Drink enough fluid to keep your pee (urine) pale yellow. Lifestyle Avoid cigarette smoke. Do not use any products that contain nicotine or tobacco. These products include cigarettes, chewing tobacco, and vaping devices, such as e-cigarettes. If you need help quitting, ask your provider. Avoid things that make you cough. These may include perfumes, candles, cleaning products, or campfire smoke. General instructions  Watch for any changes to your cough. Tell your provider about them. Always cover your mouth when you cough. If the air is dry in your bedroom or home, use a cool mist vaporizer or humidifier. If your cough is worse at night, try to sleep in a semi-upright position. Rest as needed. Contact a health care provider if: You have new symptoms, or your symptoms get worse. You cough up pus. You have a fever that does not go away or a cough that does not get better after 2-3  weeks. You cannot control your cough with medicine, and you are losing sleep. You have pain that gets worse or is not helped with medicine. You lose weight for no clear reason. You have night sweats. Get help right away if: You cough up blood. You have trouble breathing. Your heart is beating very fast. These symptoms may be an emergency. Get help right away. Call 911. Do not wait to see if the symptoms will go away. Do not drive yourself to the hospital. This information is not intended to replace advice given to you by your health care provider. Make sure you discuss any questions you have with your health care provider. Document Revised: 04/12/2022 Document Reviewed: 04/12/2022 Elsevier Patient Education  2023 [employer]  

## 2022-11-11 LAB — CBC WITH DIFFERENTIAL/PLATELET
Absolute Monocytes: 516 cells/uL (ref 200–950)
Basophils Absolute: 46 cells/uL (ref 0–200)
Basophils Relative: 0.6 %
Eosinophils Absolute: 108 cells/uL (ref 15–500)
Eosinophils Relative: 1.4 %
HCT: 39.8 % (ref 35.0–45.0)
Hemoglobin: 13.7 g/dL (ref 11.7–15.5)
Lymphs Abs: 2279 cells/uL (ref 850–3900)
MCH: 30.9 pg (ref 27.0–33.0)
MCHC: 34.4 g/dL (ref 32.0–36.0)
MCV: 89.6 fL (ref 80.0–100.0)
MPV: 11.4 fL (ref 7.5–12.5)
Monocytes Relative: 6.7 %
Neutro Abs: 4751 cells/uL (ref 1500–7800)
Neutrophils Relative %: 61.7 %
Platelets: 215 10*3/uL (ref 140–400)
RBC: 4.44 10*6/uL (ref 3.80–5.10)
RDW: 12.1 % (ref 11.0–15.0)
Total Lymphocyte: 29.6 %
WBC: 7.7 10*3/uL (ref 3.8–10.8)

## 2022-11-11 LAB — COMPLETE METABOLIC PANEL WITH GFR
AG Ratio: 1.5 (calc) (ref 1.0–2.5)
ALT: 12 U/L (ref 6–29)
AST: 15 U/L (ref 10–35)
Albumin: 4.4 g/dL (ref 3.6–5.1)
Alkaline phosphatase (APISO): 71 U/L (ref 37–153)
BUN: 13 mg/dL (ref 7–25)
CO2: 28 mmol/L (ref 20–32)
Calcium: 9 mg/dL (ref 8.6–10.4)
Chloride: 104 mmol/L (ref 98–110)
Creat: 0.57 mg/dL (ref 0.50–1.05)
Globulin: 2.9 g/dL (calc) (ref 1.9–3.7)
Glucose, Bld: 93 mg/dL (ref 65–99)
Potassium: 4.2 mmol/L (ref 3.5–5.3)
Sodium: 141 mmol/L (ref 135–146)
Total Bilirubin: 1.1 mg/dL (ref 0.2–1.2)
Total Protein: 7.3 g/dL (ref 6.1–8.1)
eGFR: 100 mL/min/{1.73_m2} (ref >=60)

## 2022-11-11 LAB — URINALYSIS, ROUTINE W REFLEX MICROSCOPIC
Bilirubin Urine: NEGATIVE
Glucose, UA: NEGATIVE
Hgb urine dipstick: NEGATIVE
Ketones, ur: NEGATIVE
Leukocytes,Ua: NEGATIVE
Nitrite: NEGATIVE
Protein, ur: NEGATIVE
Specific Gravity, Urine: 1.005 (ref 1.001–1.035)
pH: 7 (ref 5.0–8.0)

## 2022-11-11 LAB — VITAMIN D 25 HYDROXY (VIT D DEFICIENCY, FRACTURES): Vit D, 25-Hydroxy: 34 ng/mL (ref 30–100)

## 2022-11-11 LAB — LIPID PANEL
Cholesterol: 220 mg/dL — ABNORMAL HIGH (ref <200)
HDL: 56 mg/dL (ref >=50)
LDL Cholesterol (Calc): 146 mg/dL (calc) — ABNORMAL HIGH
Non-HDL Cholesterol (Calc): 164 mg/dL (calc) — ABNORMAL HIGH (ref <130)
Total CHOL/HDL Ratio: 3.9 (calc) (ref <5.0)
Triglycerides: 79 mg/dL (ref <150)

## 2022-11-11 LAB — MICROALBUMIN / CREATININE URINE RATIO
Creatinine, Urine: 24 mg/dL (ref 20–275)
Microalb, Ur: <0.2 mg/dL

## 2022-11-11 LAB — HEMOGLOBIN A1C
Hgb A1c MFr Bld: 5.8 % of total Hgb — ABNORMAL HIGH (ref <5.7)
Mean Plasma Glucose: 120 mg/dL
eAG (mmol/L): 6.6 mmol/L

## 2022-11-11 LAB — TSH: TSH: 1.54 mIU/L (ref 0.40–4.50)

## 2022-11-11 LAB — MAGNESIUM: Magnesium: 2.1 mg/dL (ref 1.5–2.5)

## 2022-11-11 LAB — INSULIN, RANDOM: Insulin: 6 u[IU]/mL

## 2022-11-11 LAB — VITAMIN B12: Vitamin B-12: 552 pg/mL (ref 200–1100)

## 2022-11-14 ENCOUNTER — Encounter: Payer: Self-pay | Admitting: Internal Medicine

## 2022-11-25 ENCOUNTER — Other Ambulatory Visit: Payer: Self-pay | Admitting: Hematology and Oncology

## 2022-11-25 MED ORDER — LORAZEPAM 0.5 MG PO TABS: ORAL_TABLET | ORAL | 3 refills | Status: AC

## 2022-11-29 DIAGNOSIS — Z86010 Personal history of colonic polyps: Secondary | ICD-10-CM | POA: Diagnosis not present

## 2022-11-29 DIAGNOSIS — Z09 Encounter for follow-up examination after completed treatment for conditions other than malignant neoplasm: Secondary | ICD-10-CM | POA: Diagnosis not present

## 2022-11-29 DIAGNOSIS — K649 Unspecified hemorrhoids: Secondary | ICD-10-CM | POA: Diagnosis not present

## 2022-11-29 DIAGNOSIS — K5730 Diverticulosis of large intestine without perforation or abscess without bleeding: Secondary | ICD-10-CM | POA: Diagnosis not present

## 2022-11-29 DIAGNOSIS — D122 Benign neoplasm of ascending colon: Secondary | ICD-10-CM | POA: Diagnosis not present

## 2022-12-03 DIAGNOSIS — D122 Benign neoplasm of ascending colon: Secondary | ICD-10-CM | POA: Diagnosis not present

## 2023-03-05 NOTE — Progress Notes (Signed)
Patient Care Team: Lucky Cowboy, MD as PCP - General (Internal Medicine) Rollene Rotunda, MD as PCP - Cardiology (Cardiology) Lucky Cowboy, MD (Internal Medicine) Willis Modena, MD as Consulting Physician (Gastroenterology) Richardean Chimera, MD as Consulting Physician (Obstetrics and Gynecology) Serena Croissant, MD as Consulting Physician (Hematology and Oncology) Lonie Peak, MD as Attending Physician (Radiation Oncology) Glenna Fellows, MD (Inactive) as Consulting Physician (General Surgery)  DIAGNOSIS: No diagnosis found.  SUMMARY OF ONCOLOGIC HISTORY: Oncology History  CHEK2-related breast cancer (HCC)  03/03/2017 Initial Diagnosis   CHEK2-related breast cancer (HCC)   Carcinoma of upper-outer quadrant of right breast in female, estrogen receptor positive (HCC)  02/18/2017 Initial Diagnosis   Right breast 1 cm mass 8 cm from nipple: biopsy 11:30 position: Grade 2 IDC with DCIS ER 100%, PR 10%, Ki-67 40%, HER-2 negative ratio 1.73; right breast 11:30 position 6 cm from nipple 0.7 cm biopsy: Fibrocystic change; T1b N0 stage IA clinical stage   03/09/2017 Genetic Testing   CHEK2 c.349A>G 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  The STAT Breast cancer panel offered by [facility] includes sequencing and rearrangement analysis for the following 9 genes:  ATM, BRCA1, BRCA2, CDH1, CHEK2, PALB2, PTEN, STK11 and TP53.   The report date is March 09, 2017.  Re-requitisioned to the common hereditary cancer panel and the melanoma panel.  The additional genes tested were negative.  The Hereditary Gene Panel offered by [facility] includes sequencing and/or deletion duplication testing of the following 46 genes: APC, ATM, AXIN2, BARD1, BMPR1A, BRCA1, BRCA2, BRIP1, CDH1, CDKN2A (p14ARF), CDKN2A (p16INK4a), CHEK2, CTNNA1, DICER1, EPCAM (Deletion/duplication testing only), GREM1 (promoter region deletion/duplication testing only), KIT, MEN1, MLH1, MSH2, MSH3, MSH6, MUTYH, NBN, NF1, NHTL1,  PALB2, PDGFRA, PMS2, POLD1, POLE, PTEN, RAD50, RAD51C, RAD51D, SDHB, SDHC, SDHD, SMAD4, SMARCA4. STK11, TP53, TSC1, TSC2, and VHL.  The following genes were evaluated for sequence changes only: SDHA and HOXB13 c.251G>A variant only.  The Melanoma panel offered by [facility] includes sequencing and/or deletion duplication testing of the following 12 genes: BAP1, BRCA1, BRCA2, BRIP1, CDK4, CDKN2A (p14ARF), CDKN2A (p16INK4a), MC1R, POT1, PTEN, RB1, TERT, and TP53.  The following gene was evaluated for sequence changes only: MITF (c.952G>A, p.GLU318Lys variant only). The updated report date is March 14, 2017.   UPDATE:CHEK2 c.349A>G has been upgraded from a Likely Pathogenic variant to a Pathogenic variant.  The change in variant classification was made as a result of re-review of the evidence in light of new variant interpretation guidelines and/or new information. The updated report date is March 03, 2020.     03/19/2017 Surgery   Right lumpectomy: IDC with DCIS, 1.2 cm, grade 2, margins negative, 0/2 lymph nodes negative, ER 100%, PR 10%, HER-2 negative ratio 1.73, Ki-67 40% T1BN0 stage IA   04/01/2017 Oncotype testing   Oncotype Dx score: 41 (28% ROR with Tamoxifen alone)   04/16/2017 - 08/27/2017 Chemotherapy   Dose dense Adriamycin and Cytoxan 4 followed by Taxol weekly 12    09/24/2017 - 10/17/2017 Radiation Therapy   Adjuvant radiation   11/28/2017 -  Anti-estrogen oral therapy   Letrozole 2.5 mg daily     CHIEF COMPLIANT:   INTERVAL HISTORY: Rebecca [name] is a   ALLERGIES:  is allergic to cholecalciferol, citalopram, dexamethasone, dilaudid [hydromorphone hcl], propoxycaine, red yeast rice [cholestin], rofecoxib, sulfa antibiotics, and sulfamethoxazole.  MEDICATIONS:  Current Outpatient Medications  Medication Sig Dispense Refill   albuterol (VENTOLIN HFA) 108 (90 Base) MCG/ACT inhaler Inhale 2 puffs into the lungs every 6 (  six) hours as needed for wheezing or shortness of breath. 8 g 2    Ibuprofen 200 MG CAPS Advil     letrozole (FEMARA) 2.5 MG tablet TAKE ONE TABLET BY MOUTH DAILY 90 tablet 3   LORazepam (ATIVAN) 0.5 MG tablet TAKE ONE TABLET BY MOUTH EVERY NIGHT AT BEDTIME AS NEEDED 30 tablet 3   montelukast (SINGULAIR) 10 MG tablet Take 1 tablet daily for Allergies & Asthma 90 tablet 3   Current Facility-Administered Medications  Medication Dose Route Frequency Provider Last Rate Last Admin   ipratropium-albuterol (DUONEB) 0.5-2.5 (3) MG/3ML nebulizer solution 3 mL  3 mL Nebulization Once Cranford, Tonya, NP        PHYSICAL EXAMINATION: ECOG PERFORMANCE STATUS: {CHL ONC ECOG [phone]}  There were no vitals filed for this visit. There were no vitals filed for this visit.  BREAST:*** No palpable masses or nodules in either right or left breasts. No palpable axillary supraclavicular or infraclavicular adenopathy no breast tenderness or nipple discharge. (exam performed in the presence of a chaperone)  LABORATORY DATA:  I have reviewed the data as listed    Latest Ref Rng & Units 11/08/2022   11:40 AM 11/01/2021    2:49 PM 08/31/2021    7:44 AM  CMP  Glucose 65 - 99 mg/dL 93  85  95   BUN 7 - 25 mg/dL 13  12  16    Creatinine 0.50 - 1.05 mg/dL 1.61  0.96  0.45   Sodium 135 - 146 mmol/L 141  140  141   Potassium 3.5 - 5.3 mmol/L 4.2  4.2  4.1   Chloride 98 - 110 mmol/L 104  103  104   CO2 20 - 32 mmol/L 28  28    Calcium 8.6 - 10.4 mg/dL 9.0  9.4    Total Protein 6.1 - 8.1 g/dL 7.3  7.0    Total Bilirubin 0.2 - 1.2 mg/dL 1.1  0.7    AST 10 - 35 U/L 15  15    ALT 6 - 29 U/L 12  11      Lab Results  Component Value Date   WBC 7.7 11/08/2022   HGB 13.7 11/08/2022   HCT 39.8 11/08/2022   MCV 89.6 11/08/2022   PLT 215 11/08/2022   NEUTROABS 4,751 11/08/2022    ASSESSMENT & PLAN:  No problem-specific Assessment & Plan notes found for this encounter.    No orders of the defined types were placed in this encounter.  The patient has a good understanding  of the overall plan. she agrees with it. she will call with any problems that may develop before the next visit here. Total time spent: 30 mins including face to face time and time spent for planning, charting and co-ordination of care   Sherlyn Lick, CMA 03/05/23    I Janan Ridge am acting as a Neurosurgeon for [employer]  ***

## 2023-03-06 ENCOUNTER — Inpatient Hospital Stay: Payer: Medicare Other | Attending: Hematology and Oncology | Admitting: Hematology and Oncology

## 2023-03-06 VITALS — BP 145/71 | HR 80 | Temp 97.8°F | Resp 18 | Ht 69.0 in | Wt 172.8 lb

## 2023-03-06 DIAGNOSIS — Z79899 Other long term (current) drug therapy: Secondary | ICD-10-CM | POA: Diagnosis not present

## 2023-03-06 DIAGNOSIS — Z9221 Personal history of antineoplastic chemotherapy: Secondary | ICD-10-CM | POA: Insufficient documentation

## 2023-03-06 DIAGNOSIS — C50411 Malignant neoplasm of upper-outer quadrant of right female breast: Secondary | ICD-10-CM | POA: Insufficient documentation

## 2023-03-06 DIAGNOSIS — Z923 Personal history of irradiation: Secondary | ICD-10-CM | POA: Diagnosis not present

## 2023-03-06 DIAGNOSIS — Z170 Estrogen receptor positive status [ER+]: Secondary | ICD-10-CM | POA: Diagnosis not present

## 2023-03-06 DIAGNOSIS — Z79811 Long term (current) use of aromatase inhibitors: Secondary | ICD-10-CM | POA: Insufficient documentation

## 2023-03-06 NOTE — Assessment & Plan Note (Addendum)
03/19/2017: Right lumpectomy: IDC with DCIS, 1.2 cm, grade 2, margins negative, 0/2 lymph nodes negative, ER 100%, PR 10%, HER-2 negative ratio 1.73, Ki-67 40% T1BN0 stage IA CHEK 2 Mutation Positive Oncotype Dx score: 41 (28% ROR with Tamoxifen alone) Treatment plan: 1. Adjuvant chemotherapy with dose dense Adriamycin Cytoxan 4 followed by weekly Taxol 12 2. followed by adjuvant radiation 09/25/17-10/15/17 3. Followed by adjuvant antiestrogen therapy ----------------------------------------------------------------------------   Current treatment: Letrozole 2.5 mg daily started 11/26/2017 (plan for treatment: 7 years) Letrozole toxicities: 1.  Muscle cramps: Much improved after using tonic water and CBD oil She has 2 grandchildren 47 and 16 years old   Insomnia issues: Currently on diazepam that she uses sparingly.  Breast cancer surveillance: 1.  Breast exam 03/06/2023: Benign 2. Mammogram: 06/14/2022: Benign breast density category A  Return to clinic in 1 year for follow-up.

## 2023-04-04 ENCOUNTER — Telehealth: Payer: Self-pay

## 2023-04-04 ENCOUNTER — Other Ambulatory Visit: Payer: Self-pay | Admitting: Hematology and Oncology

## 2023-04-04 NOTE — Telephone Encounter (Signed)
Called and LVM regarding ativan rx refill request. Last refilled 11/25/22 for 30 tab and 3 refills. Per last MD note, Pt reported as using sparingly. Asked for call back to discuss need for refill.

## 2023-04-10 DIAGNOSIS — K080 Exfoliation of teeth due to systemic causes: Secondary | ICD-10-CM | POA: Diagnosis not present

## 2023-04-30 ENCOUNTER — Ambulatory Visit: Payer: Medicare Other | Admitting: Nurse Practitioner

## 2023-04-30 NOTE — Progress Notes (Deleted)
Assessment and Plan:  ELESIA NORRICK was seen today for a ***.  Diagnoses and all order for this visit:  There are no diagnoses linked to this encounter.   Continue to monitor for any increase in fever, chills, N/V, diarrhea, changes to bowel habits, blood in stool.  Notify office for further evaluation and treatment, questions or concerns if s/s fail to improve. The risks and benefits of my recommendations, as well as other treatment options were discussed with the patient today. Questions were answered.  Further disposition pending results of labs. Discussed med's effects and SE's.    Over *** minutes of exam, counseling, chart review, and critical decision making was performed.   Future Appointments  Date Time Provider Department Center  04/30/2023  1:45 PM Adela Glimpse, NP [facility] None  05/22/2023  2:30 PM Lucky Cowboy, MD [facility] None  11/10/2023 10:00 AM Adela Glimpse, NP [facility] None  03/11/2024  2:00 PM Serena Croissant, MD [facility] None    ------------------------------------------------------------------------------------------------------------------   HPI LMP 08/03/2013  67 y.o.female presents for  Past Medical History:  Diagnosis Date   Depression    REMISSION/ SITUATIONAL   Dyspnea    Hepatitis AGE 52   RESOLVED   History of kidney stones    History of radiation therapy 09/24/17- 10/15/17   Right Breast treated to 42.56 Gy with 16 fx of 2.66 Gy   Hyperlipidemia    Insomnia    Migraines    HORMONAL   Personal history of chemotherapy    Personal history of radiation therapy    PONV (postoperative nausea and vomiting)    right breast ca dx' d 02/2017   Shingles outbreak 2003, 2015   RIGHT HIP, and between breasts 3 years ago     Allergies  Allergen Reactions   Cholecalciferol Other (See Comments)    Headache   Citalopram Other (See Comments)    HYPERACTIVITY   Dexamethasone     Chest tightness   Dilaudid [Hydromorphone Hcl] Nausea And  Vomiting   Propoxycaine Nausea And Vomiting   Red Yeast Rice [Cholestin] Other (See Comments)    headache   Rofecoxib Nausea And Vomiting and Nausea Only   Sulfa Antibiotics Swelling   Sulfamethoxazole Swelling    Current Outpatient Medications on File Prior to Visit  Medication Sig   albuterol (VENTOLIN HFA) 108 (90 Base) MCG/ACT inhaler Inhale 2 puffs into the lungs every 6 (six) hours as needed for wheezing or shortness of breath.   Ibuprofen 200 MG CAPS Advil   letrozole (FEMARA) 2.5 MG tablet TAKE ONE TABLET BY MOUTH DAILY   LORazepam (ATIVAN) 0.5 MG tablet TAKE ONE TABLET BY MOUTH EVERY NIGHT AT BEDTIME AS NEEDED   montelukast (SINGULAIR) 10 MG tablet Take 1 tablet daily for Allergies & Asthma   Current Facility-Administered Medications on File Prior to Visit  Medication   ipratropium-albuterol (DUONEB) 0.5-2.5 (3) MG/3ML nebulizer solution 3 mL    ROS: all negative except what is noted in the HPI.   Physical Exam:  LMP 08/03/2013   General Appearance: NAD.  Awake, conversant and cooperative. Eyes: PERRLA, EOMs intact.  Sclera white.  Conjunctiva without erythema. Sinuses: No frontal/maxillary tenderness.  No nasal discharge. Nares patent.  ENT/Mouth: Ext aud canals clear.  Bilateral TMs w/DOL and without erythema or bulging. Hearing intact.  Posterior pharynx without swelling or exudate.  Tonsils without swelling or erythema.  Neck: Supple.  No masses, nodules or thyromegaly. Respiratory: Effort is regular with non-labored breathing. Breath sounds are equal  bilaterally without rales, rhonchi, wheezing or stridor.  Cardio: RRR with no MRGs. Brisk peripheral pulses without edema.  Abdomen: Active BS in all four quadrants.  Soft and non-tender without guarding, rebound tenderness, hernias or masses. Lymphatics: Non tender without lymphadenopathy.  Musculoskeletal: Full ROM, 5/5 strength, normal ambulation.  No clubbing or cyanosis. Skin: Appropriate color for ethnicity. Warm  without rashes, lesions, ecchymosis, ulcers.  Neuro: CN II-XII grossly normal. Normal muscle tone without cerebellar symptoms and intact sensation.   Psych: AO X 3,  appropriate mood and affect, insight and judgment.     Adela Glimpse, NP 9:59 AM [facility]

## 2023-05-01 ENCOUNTER — Ambulatory Visit (INDEPENDENT_AMBULATORY_CARE_PROVIDER_SITE_OTHER): Payer: Medicare Other | Admitting: Nurse Practitioner

## 2023-05-01 ENCOUNTER — Encounter: Payer: Self-pay | Admitting: Nurse Practitioner

## 2023-05-01 VITALS — BP 120/78 | HR 66 | Temp 97.6°F

## 2023-05-01 DIAGNOSIS — G4700 Insomnia, unspecified: Secondary | ICD-10-CM

## 2023-05-01 DIAGNOSIS — M545 Low back pain, unspecified: Secondary | ICD-10-CM | POA: Diagnosis not present

## 2023-05-01 DIAGNOSIS — R300 Dysuria: Secondary | ICD-10-CM | POA: Diagnosis not present

## 2023-05-01 DIAGNOSIS — B372 Candidiasis of skin and nail: Secondary | ICD-10-CM

## 2023-05-01 MED ORDER — NYSTATIN 100000 UNIT/GM EX CREA
1.0000 | TOPICAL_CREAM | Freq: Two times a day (BID) | CUTANEOUS | 0 refills | Status: DC
Start: 2023-05-01 — End: 2024-03-11

## 2023-05-01 MED ORDER — HYDROXYZINE PAMOATE 25 MG PO CAPS
25.0000 mg | ORAL_CAPSULE | Freq: Every evening | ORAL | 0 refills | Status: DC | PRN
Start: 2023-05-01 — End: 2024-03-11

## 2023-05-01 MED ORDER — CIPROFLOXACIN HCL 250 MG PO TABS
ORAL_TABLET | ORAL | 0 refills | Status: DC
Start: 2023-05-01 — End: 2023-05-22

## 2023-05-01 NOTE — Progress Notes (Signed)
Assessment and Plan:  Rebecca [name] was seen today for an episodic visit.  Diagnoses and all order for this visit:  Dysuria/low back pain Discussed IC if culture returns negative Keep apt with Urology 05/2023 Stay well hydrated to keep urinary system well flushed Consider daily cranberry juice or oral supplement to help any bacteria from adhering to bladder wall causing increase for infection. Monitor for any increase in fever, chills, N/V, abdominal pain, hematuria.   Contact office or report to ER for further evaluation if s/s fail to improve or any sign of worsening infection as noted above.  - Urinalysis, Routine w reflex microscopic - Urine Culture - ciprofloxacin (CIPRO) 250 MG tablet; Take 1 tablet 2 x /day with Food for Infection  Dispense: 14 tablet; Refill: 0  Insomnia, unspecified type Start Hydroxyzine Discussed good sleep hygiene. Establish bed and wake times. Sleep restriction-only sleep estimated hrs sleep. Bed only for sex and sleep, only sleep when sleepy, out of bed if anxious (stimulus control). Reviewed relaxation techniques, mindful meditations. Expected sleep duration. Addressed worries about not sleeping.   - hydrOXYzine (VISTARIL) 25 MG capsule; Take 1 capsule (25 mg total) by mouth at bedtime as needed.  Dispense: 30 capsule; Refill: 0  Skin candidiasis Keep skin free from moisture. Start Nystatin as directed Contact office if s/s fail to improve.  - nystatin cream (MYCOSTATIN); Apply 1 Application topically 2 (two) times daily.  Dispense: 30 g; Refill: 0  Notify office for further evaluation and treatment, questions or concerns if s/s fail to improve. The risks and benefits of my recommendations, as well as other treatment options were discussed with the patient today. Questions were answered.  Further disposition pending results of labs. Discussed med's effects and SE's.    Over 20 minutes of exam, counseling, chart review, and critical decision  making was performed.   Future Appointments  Date Time Provider Department Center  05/22/2023  2:30 PM Lucky Cowboy, MD [facility] None  11/10/2023 10:00 AM Adela Glimpse, NP [facility] None  03/11/2024  2:00 PM Serena Croissant, MD [facility] None    ------------------------------------------------------------------------------------------------------------------   HPI BP 120/78   Pulse 66   Temp 97.6 F (36.4 C)   LMP 08/03/2013   SpO2 98%   Patient complains of dysuria. She has had symptoms for 1 week. Patient also complains of back pain. Patient denies fever, headache, stomach ache, and vaginal discharge. Patient does not have a history of recurrent UTI. Patient does not have a history of pyelonephritis.   She is also concerned for right axilla and right breast rash with bumps.  Associated itching.  Feels this started after biopsy.  No longer able to wear deodorant without breaking out.    States she is constantly fatigued due to not sleeping well.  Has Lorazepam on hand but tries not to take.  This has her emotional as she has not been able to engage in other hobbies in her life due to not sleeping and feeling fatigued.    Past Medical History:  Diagnosis Date   Depression    REMISSION/ SITUATIONAL   Dyspnea    Hepatitis AGE 57   RESOLVED   History of kidney stones    History of radiation therapy 09/24/17- 10/15/17   Right Breast treated to 42.56 Gy with 16 fx of 2.66 Gy   Hyperlipidemia    Insomnia    Migraines    HORMONAL   Personal history of chemotherapy    Personal history of radiation therapy  PONV (postoperative nausea and vomiting)    right breast ca dx' d 02/2017   Shingles outbreak 2003, 2015   RIGHT HIP, and between breasts 3 years ago     Allergies  Allergen Reactions   Cholecalciferol Other (See Comments)    Headache   Citalopram Other (See Comments)    HYPERACTIVITY   Dexamethasone     Chest tightness   Dilaudid [Hydromorphone Hcl] Nausea And  Vomiting   Propoxycaine Nausea And Vomiting   Red Yeast Rice [Cholestin] Other (See Comments)    headache   Rofecoxib Nausea And Vomiting and Nausea Only   Sulfa Antibiotics Swelling   Sulfamethoxazole Swelling    Current Outpatient Medications on File Prior to Visit  Medication Sig   albuterol (VENTOLIN HFA) 108 (90 Base) MCG/ACT inhaler Inhale 2 puffs into the lungs every 6 (six) hours as needed for wheezing or shortness of breath.   Ibuprofen 200 MG CAPS Advil   letrozole (FEMARA) 2.5 MG tablet TAKE ONE TABLET BY MOUTH DAILY   LORazepam (ATIVAN) 0.5 MG tablet TAKE ONE TABLET BY MOUTH EVERY NIGHT AT BEDTIME AS NEEDED   montelukast (SINGULAIR) 10 MG tablet Take 1 tablet daily for Allergies & Asthma   Current Facility-Administered Medications on File Prior to Visit  Medication   ipratropium-albuterol (DUONEB) 0.5-2.5 (3) MG/3ML nebulizer solution 3 mL    ROS: all negative except what is noted in the HPI.   Physical Exam:  BP 120/78   Pulse 66   Temp 97.6 F (36.4 C)   LMP 08/03/2013   SpO2 98%   General Appearance: NAD.  Awake, conversant and cooperative. Eyes: PERRLA, EOMs intact.  Sclera white.  Conjunctiva without erythema. Sinuses: No frontal/maxillary tenderness.  No nasal discharge. Nares patent.  ENT/Mouth: Ext aud canals clear.  Bilateral TMs w/DOL and without erythema or bulging. Hearing intact.  Posterior pharynx without swelling or exudate.  Tonsils without swelling or erythema.  Neck: Supple.  No masses, nodules or thyromegaly. Respiratory: Effort is regular with non-labored breathing. Breath sounds are equal bilaterally without rales, rhonchi, wheezing or stridor.  Cardio: RRR with no MRGs. Brisk peripheral pulses without edema.  Abdomen: Active BS in all four quadrants.  Soft and non-tender without guarding, rebound tenderness, hernias or masses. Lymphatics: Non tender without lymphadenopathy.  Musculoskeletal: Full ROM, 5/5 strength, normal ambulation.  No  clubbing or cyanosis. Skin: Right axilla mild erythema.  Appropriate color for ethnicity. Warm without rashes, lesions, ecchymosis, ulcers.  Neuro: CN II-XII grossly normal. Normal muscle tone without cerebellar symptoms and intact sensation.   Psych: AO X 3,  appropriate mood and affect, insight and judgment.     Adela Glimpse, NP 1:49 PM [facility]

## 2023-05-01 NOTE — Patient Instructions (Signed)
Insomnia Insomnia is a sleep disorder that makes it difficult to fall asleep or stay asleep. Insomnia can cause fatigue, low energy, difficulty concentrating, mood swings, and poor performance at work or school. There are three different ways to classify insomnia: Difficulty falling asleep. Difficulty staying asleep. Waking up too early in the morning. Any type of insomnia can be long-term (chronic) or short-term (acute). Both are common. Short-term insomnia usually lasts for 3 months or less. Chronic insomnia occurs at least three times a week for longer than 3 months. What are the causes? Insomnia may be caused by another condition, situation, or substance, such as: Having certain mental health conditions, such as anxiety and depression. Using caffeine, alcohol, tobacco, or drugs. Having gastrointestinal conditions, such as gastroesophageal reflux disease (GERD). Having certain medical conditions. These include: Asthma. Alzheimer's disease. Stroke. Chronic pain. An overactive thyroid gland (hyperthyroidism). Other sleep disorders, such as restless legs syndrome and sleep apnea. Menopause. Sometimes, the cause of insomnia may not be known. What increases the risk? Risk factors for insomnia include: Gender. Females are affected more often than males. Age. Insomnia is more common as people get older. Stress and certain medical and mental health conditions. Lack of exercise. Having an irregular work schedule. This may include working night shifts and traveling between different time zones. What are the signs or symptoms? If you have insomnia, the main symptom is having trouble falling asleep or having trouble staying asleep. This may lead to other symptoms, such as: Feeling tired or having low energy. Feeling nervous about going to sleep. Not feeling rested in the morning. Having trouble concentrating. Feeling irritable, anxious, or depressed. How is this diagnosed? This condition  may be diagnosed based on: Your symptoms and medical history. Your health care provider may ask about: Your sleep habits. Any medical conditions you have. Your mental health. A physical exam. How is this treated? Treatment for insomnia depends on the cause. Treatment may focus on treating an underlying condition that is causing the insomnia. Treatment may also include: Medicines to help you sleep. Counseling or therapy. Lifestyle adjustments to help you sleep better. Follow these instructions at home: Eating and drinking  Limit or avoid alcohol, caffeinated beverages, and products that contain nicotine and tobacco, especially close to bedtime. These can disrupt your sleep. Do not eat a large meal or eat spicy foods right before bedtime. This can lead to digestive discomfort that can make it hard for you to sleep. Sleep habits  Keep a sleep diary to help you and your health care provider figure out what could be causing your insomnia. Write down: When you sleep. When you wake up during the night. How well you sleep and how rested you feel the next day. Any side effects of medicines you are taking. What you eat and drink. Make your bedroom a dark, comfortable place where it is easy to fall asleep. Put up shades or blackout curtains to block light from outside. Use a white noise machine to block noise. Keep the temperature cool. Limit screen use before bedtime. This includes: Not watching TV. Not using your smartphone, tablet, or computer. Stick to a routine that includes going to bed and waking up at the same times every day and night. This can help you fall asleep faster. Consider making a quiet activity, such as reading, part of your nighttime routine. Try to avoid taking naps during the day so that you sleep better at night. Get out of bed if you are still awake after   15 minutes of trying to sleep. Keep the lights down, but try reading or doing a quiet activity. When you feel  sleepy, go back to bed. General instructions Take over-the-counter and prescription medicines only as told by your health care provider. Exercise regularly as told by your health care provider. However, avoid exercising in the hours right before bedtime. Use relaxation techniques to manage stress. Ask your health care provider to suggest some techniques that may work well for you. These may include: Breathing exercises. Routines to release muscle tension. Visualizing peaceful scenes. Make sure that you drive carefully. Do not drive if you feel very sleepy. Keep all follow-up visits. This is important. Contact a health care provider if: You are tired throughout the day. You have trouble in your daily routine due to sleepiness. You continue to have sleep problems, or your sleep problems get worse. Get help right away if: You have thoughts about hurting yourself or someone else. Get help right away if you feel like you may hurt yourself or others, or have thoughts about taking your own life. Go to your nearest emergency room or: Call 911. Call the National Suicide Prevention Lifeline at 1-800-273-8255 or 988. This is open 24 hours a day. Text the Crisis Text Line at 741741. Summary Insomnia is a sleep disorder that makes it difficult to fall asleep or stay asleep. Insomnia can be long-term (chronic) or short-term (acute). Treatment for insomnia depends on the cause. Treatment may focus on treating an underlying condition that is causing the insomnia. Keep a sleep diary to help you and your health care provider figure out what could be causing your insomnia. This information is not intended to replace advice given to you by your health care provider. Make sure you discuss any questions you have with your health care provider. Document Revised: 07/23/2021 Document Reviewed: 07/23/2021 Elsevier Patient Education  2024 [employer]  

## 2023-05-04 LAB — URINALYSIS, ROUTINE W REFLEX MICROSCOPIC
Bacteria, UA: NONE SEEN /HPF
Bilirubin Urine: NEGATIVE
Glucose, UA: NEGATIVE
Hgb urine dipstick: NEGATIVE
Hyaline Cast: NONE SEEN /LPF
Ketones, ur: NEGATIVE
Nitrite: NEGATIVE
Protein, ur: NEGATIVE
Specific Gravity, Urine: 1.016 (ref 1.001–1.035)
Squamous Epithelial / HPF: NONE SEEN /HPF (ref <=5)
pH: 6.5 (ref 5.0–8.0)

## 2023-05-04 LAB — URINE CULTURE
MICRO NUMBER:: 15427135
SPECIMEN QUALITY:: ADEQUATE

## 2023-05-05 ENCOUNTER — Encounter: Payer: Self-pay | Admitting: Nurse Practitioner

## 2023-05-05 ENCOUNTER — Telehealth: Payer: Self-pay

## 2023-05-05 NOTE — Telephone Encounter (Signed)
Patient inquiring about the test results for her urine culture. She has MyChart.

## 2023-05-12 ENCOUNTER — Telehealth: Payer: Self-pay | Admitting: Nurse Practitioner

## 2023-05-12 NOTE — Telephone Encounter (Signed)
Pt is wondering if you could call [facility]? Her kidney's are really hurting.  [facility] phone # -- [phone]

## 2023-05-13 ENCOUNTER — Encounter: Payer: Self-pay | Admitting: Nurse Practitioner

## 2023-05-14 DIAGNOSIS — K769 Liver disease, unspecified: Secondary | ICD-10-CM | POA: Diagnosis not present

## 2023-05-14 DIAGNOSIS — D1803 Hemangioma of intra-abdominal structures: Secondary | ICD-10-CM | POA: Diagnosis not present

## 2023-05-14 DIAGNOSIS — N202 Calculus of kidney with calculus of ureter: Secondary | ICD-10-CM | POA: Diagnosis not present

## 2023-05-14 DIAGNOSIS — R1084 Generalized abdominal pain: Secondary | ICD-10-CM | POA: Diagnosis not present

## 2023-05-14 DIAGNOSIS — K7689 Other specified diseases of liver: Secondary | ICD-10-CM | POA: Diagnosis not present

## 2023-05-14 DIAGNOSIS — D259 Leiomyoma of uterus, unspecified: Secondary | ICD-10-CM | POA: Diagnosis not present

## 2023-05-19 DIAGNOSIS — D259 Leiomyoma of uterus, unspecified: Secondary | ICD-10-CM | POA: Diagnosis not present

## 2023-05-22 ENCOUNTER — Encounter: Payer: Self-pay | Admitting: Internal Medicine

## 2023-05-22 ENCOUNTER — Ambulatory Visit (INDEPENDENT_AMBULATORY_CARE_PROVIDER_SITE_OTHER): Payer: Medicare Other | Admitting: Internal Medicine

## 2023-05-22 VITALS — BP 128/70 | HR 81 | Temp 97.9°F | Resp 16 | Ht 69.0 in | Wt 173.8 lb

## 2023-05-22 DIAGNOSIS — E663 Overweight: Secondary | ICD-10-CM

## 2023-05-22 DIAGNOSIS — F5101 Primary insomnia: Secondary | ICD-10-CM

## 2023-05-22 DIAGNOSIS — R7309 Other abnormal glucose: Secondary | ICD-10-CM | POA: Diagnosis not present

## 2023-05-22 DIAGNOSIS — Z79899 Other long term (current) drug therapy: Secondary | ICD-10-CM

## 2023-05-22 DIAGNOSIS — E559 Vitamin D deficiency, unspecified: Secondary | ICD-10-CM

## 2023-05-22 DIAGNOSIS — E782 Mixed hyperlipidemia: Secondary | ICD-10-CM | POA: Diagnosis not present

## 2023-05-22 DIAGNOSIS — C50911 Malignant neoplasm of unspecified site of right female breast: Secondary | ICD-10-CM

## 2023-05-22 DIAGNOSIS — R0989 Other specified symptoms and signs involving the circulatory and respiratory systems: Secondary | ICD-10-CM

## 2023-05-22 LAB — CBC WITH DIFFERENTIAL/PLATELET
Absolute Monocytes: 364 cells/uL (ref 200–950)
Basophils Absolute: 50 cells/uL (ref 0–200)
Basophils Relative: 0.9 %
Eosinophils Absolute: 50 cells/uL (ref 15–500)
Eosinophils Relative: 0.9 %
HCT: 38 % (ref 35.0–45.0)
Hemoglobin: 12.6 g/dL (ref 11.7–15.5)
Lymphs Abs: 1557 cells/uL (ref 850–3900)
MCH: 31.1 pg (ref 27.0–33.0)
MCHC: 33.2 g/dL (ref 32.0–36.0)
MCV: 93.8 fL (ref 80.0–100.0)
MPV: 11.7 fL (ref 7.5–12.5)
Monocytes Relative: 6.5 %
Neutro Abs: 3578 cells/uL (ref 1500–7800)
Neutrophils Relative %: 63.9 %
Platelets: 204 10*3/uL (ref 140–400)
RBC: 4.05 10*6/uL (ref 3.80–5.10)
RDW: 12.1 % (ref 11.0–15.0)
Total Lymphocyte: 27.8 %
WBC: 5.6 10*3/uL (ref 3.8–10.8)

## 2023-05-22 MED ORDER — PHENTERMINE HCL 37.5 MG PO TABS
ORAL_TABLET | ORAL | 1 refills | Status: DC
Start: 2023-05-22 — End: 2024-03-11

## 2023-05-22 MED ORDER — TOPIRAMATE 50 MG PO TABS: ORAL_TABLET | ORAL | 1 refills | Status: DC

## 2023-05-22 MED ORDER — TRAZODONE HCL 150 MG PO TABS
ORAL_TABLET | ORAL | 0 refills | Status: AC
Start: 2023-05-22 — End: ?

## 2023-05-22 NOTE — Progress Notes (Signed)
Future Appointments  Date Time Provider Department  05/22/2023                      6 mo  2:30 PM Lucky Cowboy, MD [facility]  11/10/2023                        cpe 10:00 AM Adela Glimpse, NP [facility]  03/11/2024  2:00 PM Serena Croissant, MD [facility]    History of Present Illness:       This very nice 67 y.o. female presents for 3 month follow up with HTN, HLD, Hx/o Breast Ca  Pre-Diabetes and Vitamin D Deficiency.  Patient is on Letrozole for Rt Breat cancer  ( 2018 ) & is followed by Dr Pamelia Hoit.         Patient is followed expectantly for labile HTN  &  BP has been controlled at home. Today's BP is at goal - 128/70. Patient has had no complaints of any cardiac type chest pain, palpitations, dyspnea / orthopnea / PND, dizziness, claudication, or dependent edema.  BP Readings from Last 3 Encounters:  05/22/23 128/70  05/01/23 120/78  03/06/23 (!) 145/71         Hyperlipidemia is controlled with diet & meds. Patient denies myalgias or other med SE's. Last Lipids were not at goal :  Lab Results  Component Value Date   CHOL 220 (H) 11/08/2022   HDL 56 11/08/2022   LDLCALC 146 (H) 11/08/2022   TRIG 79 11/08/2022   CHOLHDL 3.9 11/08/2022     Also, the patient has history of PreDiabetes (A1c 5.8% /2015) and has had no symptoms of reactive hypoglycemia, diabetic polys, paresthesias or visual blurring.  Patient expressed concern re: her weight & desire to try meds to help with weightLast A1c was not at goal:  Lab Results  Component Value Date   HGBA1C 5.8 (H) 11/08/2022                                                      Further, the patient also has history of Vitamin D Deficiency  ( "28" / 2015 and "`18" /2022)  and does not supplement vitamin D . Last vitamin D was still very low :  Lab Results  Component Value Date   VD25OH 34 11/08/2022     Current Outpatient Medications on File Prior to Visit  Medication Sig   albuterol HFA  inhaler Inhale 2 puffs  every 6 hours as needed f   hydrOXYzine  25 MG capsule Take 1 capsule  at bedtime as needed.   Ibuprofen 200 MG CAPS Advil   letrozole 2.5 MG tablet TAKE ONE TABLET DAILY   LORazepam 0.5 MG tablet TAKE 1 TAB  AT BEDTIME AS NEEDED   nystatin cream  Apply 2  times daily.     Allergies  Allergen Reactions   Cholecalciferol Other (See Comments)    Headache   Citalopram Other (See Comments)    HYPERACTIVITY   Dexamethasone     Chest tightness   Dilaudid [Hydromorphone Hcl] Nausea And Vomiting   Propoxycaine Nausea And Vomiting   Red Yeast Rice [Cholestin] Other (See Comments)    headache   Rofecoxib Nausea And Vomiting and Nausea Only   Sulfa Antibiotics Swelling  Sulfamethoxazole Swelling     PMHx:   Past Medical History:  Diagnosis Date   Depression    REMISSION/ SITUATIONAL   Dyspnea    Hepatitis AGE 66   RESOLVED   History of kidney stones    History of radiation therapy 09/24/17- 10/15/17   Right Breast treated to 42.56 Gy with 16 fx of 2.66 Gy   Hyperlipidemia    Insomnia    Migraines    HORMONAL   Personal history of chemotherapy    Personal history of radiation therapy    PONV (postoperative nausea and vomiting)    right breast ca dx' d 02/2017   Shingles outbreak 2003, 2015   RIGHT HIP, and between breasts 3 years ago      Immunization History  Administered Date(s) Administered   Influenza Inj Mdck Quad With Preservative 06/26/2018   Influenza,inj,Quad PF,6+ Mos 06/25/2017   Td 08/27/2007   Tdap 03/16/2018      Past Surgical History:  Procedure Laterality Date   BREAST EXCISIONAL BIOPSY Right    BREAST LUMPECTOMY Right 02/2017   BREAST LUMPECTOMY WITH RADIOACTIVE SEED AND SENTINEL LYMPH NODE BIOPSY Right 03/19/2017   Procedure: RIGHT BREAST LUMPECTOMY WITH RADIOACTIVE SEED AND SENTINEL LYMPH NODE BIOPSY;  Surgeon: Glenna Fellows, MD;  Location: [facility];  Service: General;  Laterality: Right;   CYSTOSCOPY WITH RETROGRADE  PYELOGRAM, URETEROSCOPY AND STENT PLACEMENT Bilateral 08/31/2021   Procedure: CYSTOSCOPY WITH BILATERAL RETROGRADE PYELOGRAM, URETEROSCOPY AND STENT PLACEMENT;  Surgeon: Sebastian Ache, MD;  Location[facility];  Service: Urology;  Laterality: Bilateral;   HOLMIUM LASER APPLICATION Bilateral 08/31/2021   Procedure: HOLMIUM LASER APPLICATION;  Surgeon: Sebastian Ache, MD;  Location: [facility];  Service: Urology;  Laterality: Bilateral;   KNEE ARTHROSCOPY Right 2002   TUBAL LIGATION     uterine polyps  2016     FHx:    Reviewed / unchanged   SHx:    Reviewed / unchanged    Systems Review:  Constitutional: Denies fever, chills, wt changes, headaches, insomnia, fatigue, night sweats, change in appetite. Eyes: Denies redness, blurred vision, diplopia, discharge, itchy, watery eyes.  ENT: Denies discharge, congestion, post nasal drip, epistaxis, sore throat, earache, hearing loss, dental pain, tinnitus, vertigo, sinus pain, snoring.  CV: Denies chest pain, palpitations, irregular heartbeat, syncope, dyspnea, diaphoresis, orthopnea, PND, claudication or edema. Respiratory: denies cough, dyspnea, DOE, pleurisy, hoarseness, laryngitis, wheezing.  Gastrointestinal: Denies dysphagia, odynophagia, heartburn, reflux, water brash, abdominal pain or cramps, nausea, vomiting, bloating, diarrhea, constipation, hematemesis, melena, hematochezia  or hemorrhoids. Genitourinary: Denies dysuria, frequency, urgency, nocturia, hesitancy, discharge, hematuria or flank pain. Musculoskeletal: Denies arthralgias, myalgias, stiffness, jt. swelling, pain, limping or strain/sprain.  Skin: Denies pruritus, rash, hives, warts, acne, eczema or change in skin lesion(s). Neuro: No weakness, tremor, incoordination, spasms, paresthesia or pain. Psychiatric: Denies confusion, memory loss or sensory loss. Endo: Denies change in weight, skin or hair change.  Heme/Lymph: No excessive bleeding,  bruising or enlarged lymph nodes.   Physical Exam  BP 128/70   Pulse 81   Temp 97.9 F (36.6 C)   Resp 16   Ht 5\' 9"  (1.753 m)   Wt 173 lb 12.8 oz (78.8 kg)   LMP 08/03/2013   SpO2 96%   BMI 25.67 kg/m   Appears  well nourished, well groomed  and in no distress.  Eyes: PERRLA, EOMs, conjunctiva no swelling or erythema. Sinuses: No frontal/maxillary tenderness ENT/Mouth: EAC's clear, TM's nl w/o erythema, bulging. Nares  clear w/o erythema, swelling, exudates. Oropharynx clear without erythema or exudates. Oral hygiene is good. Tongue normal, non obstructing. Hearing intact.  Neck: Supple. Thyroid not palpable. Car 2+/2+ without bruits, nodes or JVD. Chest: Respirations nl with BS clear & equal w/o rales, rhonchi, wheezing or stridor.  Cor: Heart sounds normal w/ regular rate and rhythm without sig. murmurs, gallops, clicks or rubs. Peripheral pulses normal and equal  without edema.  Abdomen: Soft & bowel sounds normal. Non-tender w/o guarding, rebound, hernias, masses or organomegaly.  Lymphatics: Unremarkable.  Musculoskeletal: Full ROM all peripheral extremities, joint stability, 5/5 strength and normal gait.  Skin: Warm, dry without exposed rashes, lesions or ecchymosis apparent.  Neuro: Cranial nerves intact, reflexes equal bilaterally. Sensory-motor testing grossly intact. Tendon reflexes grossly intact.  Pysch: Alert & oriented x 3.  Insight and judgement nl & appropriate. No ideations.   Assessment and Plan:  1. Labile hypertension  - Continue medication, monitor blood pressure at home.  - Continue DASH diet.  Reminder to go to the ER if any CP,  SOB, nausea, dizziness, severe HA, changes vision/speech.   - COMPLETE METABOLIC PANEL WITH GFR - Magnesium - TSH - CBC with Differential/Platelet   2. Hyperlipidemia, mixed  - Continue diet/meds, exercise,& lifestyle modifications.  - Continue monitor periodic cholesterol/liver & renal functions    - Lipid panel -  TSH   3. Abnormal glucose  - Continue diet, exercise  - Lifestyle modifications.  - Monitor appropriate labs    - Hemoglobin A1c - Insulin, random  4. Vitamin D deficiency  - Continue supplementation    - VITAMIN D 25 Hydroxy    5. Primary insomnia  - traZODone (DESYREL) 150 MG tablet;  Take  1/3 to 1/2 to 1 tablet    1 to 2 hours  before Bedtime  as Needed for Sleep   Dispense: 90 tablet; Refill: 0   6. Overweight (BMI 25.0-29.9)  - phentermine (ADIPEX-P) 37.5 MG tablet;  Take 1/2 to 1 tablet every Morning for Dieting & Weight Loss  Dispense:  90 tablet; Refill: 1  - topiramate (TOPAMAX) 50 MG tablet;  Take 1/2 to 1 tablet 2 x /day at Suppertime & Bedtime for Dieting & Weight Loss   Dispense: 180 tablet; Refill: 1   7. Malignant neoplasm of right breast in female ( 2018)  [facility])  - followed by Dr Pamelia Hoit on Letrozole    8. Medication management  - COMPLETE METABOLIC PANEL WITH GFR - Magnesium - Lipid panel - TSH - Hemoglobin A1c - Insulin, random - VITAMIN D 25 Hydroxy  - CBC with Differential/Platelet        Discussed  regular exercise, BP monitoring, weight control to achieve/maintain BMI less than 25 and discussed med and SE's. Recommended labs to assess /monitor clinical status .  I discussed the assessment and treatment plan with the patient. The patient was provided an opportunity to ask questions and all were answered. The patient agreed with the plan and demonstrated an understanding of the instructions.  I provided over 30 minutes of exam, counseling, chart review and  complex critical decision making.        The patient was advised to call back or seek an in-person evaluation if the symptoms worsen or if the condition fails to improve as anticipated.   Marinus Maw, MD

## 2023-05-22 NOTE — Patient Instructions (Signed)

## 2023-05-23 LAB — LIPID PANEL
Cholesterol: 233 mg/dL — ABNORMAL HIGH (ref <200)
HDL: 66 mg/dL (ref >=50)
LDL Cholesterol (Calc): 143 mg/dL — ABNORMAL HIGH
Non-HDL Cholesterol (Calc): 167 mg/dL — ABNORMAL HIGH (ref <130)
Total CHOL/HDL Ratio: 3.5 (calc) (ref <5.0)
Triglycerides: 121 mg/dL (ref <150)

## 2023-05-23 LAB — COMPLETE METABOLIC PANEL WITH GFR
AG Ratio: 2 (calc) (ref 1.0–2.5)
ALT: 14 U/L (ref 6–29)
AST: 15 U/L (ref 10–35)
Albumin: 4.4 g/dL (ref 3.6–5.1)
Alkaline phosphatase (APISO): 60 U/L (ref 37–153)
BUN: 17 mg/dL (ref 7–25)
CO2: 28 mmol/L (ref 20–32)
Calcium: 9.5 mg/dL (ref 8.6–10.4)
Chloride: 104 mmol/L (ref 98–110)
Creat: 0.57 mg/dL (ref 0.50–1.05)
Globulin: 2.2 g/dL (ref 1.9–3.7)
Glucose, Bld: 94 mg/dL (ref 65–99)
Potassium: 4.5 mmol/L (ref 3.5–5.3)
Sodium: 140 mmol/L (ref 135–146)
Total Bilirubin: 0.8 mg/dL (ref 0.2–1.2)
Total Protein: 6.6 g/dL (ref 6.1–8.1)
eGFR: 100 mL/min/{1.73_m2} (ref >=60)

## 2023-05-23 LAB — HEMOGLOBIN A1C
Hgb A1c MFr Bld: 5.7 %{Hb} — ABNORMAL HIGH (ref <5.7)
Mean Plasma Glucose: 117 mg/dL
eAG (mmol/L): 6.5 mmol/L

## 2023-05-23 LAB — MAGNESIUM: Magnesium: 2 mg/dL (ref 1.5–2.5)

## 2023-05-23 LAB — TSH: TSH: 2.35 m[IU]/L (ref 0.40–4.50)

## 2023-05-23 LAB — VITAMIN D 25 HYDROXY (VIT D DEFICIENCY, FRACTURES): Vit D, 25-Hydroxy: 29 ng/mL — ABNORMAL LOW (ref 30–100)

## 2023-05-23 LAB — INSULIN, RANDOM: Insulin: 13.7 u[IU]/mL

## 2023-05-24 ENCOUNTER — Other Ambulatory Visit: Payer: Self-pay | Admitting: Internal Medicine

## 2023-05-24 NOTE — Progress Notes (Signed)
<>*<>*<>*<>*<>*<>*<>*<>*<>*<>*<>*<>*<>*<>*<>*<>*<>*<>*<>*<>*<>*<>*<>*<>*<> <>*<>*<>*<>*<>*<>*<>*<>*<>*<>*<>*<>*<>*<>*<>*<>*<>*<>*<>*<>*<>*<>*<>*<>*<>  -Test results slightly outside the reference range are not unusual. If there is anything important, I will review this with you,  otherwise it is considered normal test values.  If you have further questions,  please do not hesitate to contact me at the office or via My Chart.   <>*<>*<>*<>*<>*<>*<>*<>*<>*<>*<>*<>*<>*<>*<>*<>*<>*<>*<>*<>*<>*<>*<>*<>*<> <>*<>*<>*<>*<>*<>*<>*<>*<>*<>*<>*<>*<>*<>*<>*<>*<>*<>*<>*<>*<>*<>*<>*<>*<>  -   A1c = 5.7% - is borderline elevated 12 week average blood sugar, So    - Avoid Sweets, Candy & White Stuff   - White Rice, White [place], White Flour  - Breads &  Pasta  <>*<>*<>*<>*<>*<>*<>*<>*<>*<>*<>*<>*<>*<>*<>*<>*<>*<>*<>*<>*<>*<>*<>*<>*<> <>*<>*<>*<>*<>*<>*<>*<>*<>*<>*<>*<>*<>*<>*<>*<>*<>*<>*<>*<>*<>*<>*<>*<>*<>  -  Vitamin D is Very Very low     -  Vitamin D goal is between 70-100.   - Please take  Vitamin D5,000 units  / day !    - It is very important as a natural anti-inflammatory and helping the                           immune system protect against viral infections, like the Covid-19    helping hair, skin, and nails, as well as reducing stroke and heart attack risk.   - It helps your bones and helps with mood.  - It also decreases numerous cancer risks so please                                                                                           take it as directed.   - Low Vit D is associated with a 200-300% higher risk for CANCER   and 200-300% higher risk for HEART   ATTACK  &  STROKE.    - It is also associated with higher death rate at younger ages,   autoimmune diseases like Rheumatoid arthritis, Lupus, Multiple Sclerosis.     - Also many other serious conditions, like depression, Alzheimer's  Dementia,  muscle aches, fatigue, fibromyalgia    <>*<>*<>*<>*<>*<>*<>*<>*<>*<>*<>*<>*<>*<>*<>*<>*<>*<>*<>*<>*<>*<>*<>*<>*<> <>*<>*<>*<>*<>*<>*<>*<>*<>*<>*<>*<>*<>*<>*<>*<>*<>*<>*<>*<>*<>*<>*<>*<>*<>   -  Total  Chol =   233  - Elevated             (  Ideal  or  Goal is less than 180  !  )  & -  Bad / Dangerous LDL  Chol =   143 - also Elevated              (  Ideal  or  Goal is less than 70  !  )   - Cholesterol is too high - Recommend a stricter low cholesterol diet   - Cholesterol only comes from animal sources                                                                                      - ie. meat, dairy, egg yolks  -  Eat all the vegetables you want.  - Avoid Meat, Avoid Meat,  Avoid Meat                                                                    - especially Red Meat - Beef AND Pork .  - Avoid cheese & dairy - milk & ice cream.     - Cheese is the most concentrated form of trans-fats which                                                                              is the worst thing to clog up our arteries.    - Veggie cheese is OK which can be found in the fresh                                                produce section at [employer] or [employer] or [employer]  <>*<>*<>*<>*<>*<>*<>*<>*<>*<>*<>*<>*<>*<>*<>*<>*<>*<>*<>*<>*<>*<>*<>*<>*<> <>*<>*<>*<>*<>*<>*<>*<>*<>*<>*<>*<>*<>*<>*<>*<>*<>*<>*<>*<>*<>*<>*<>*<>*<>  -  All Else - CBC - Kidneys - Electrolytes - Liver - Magnesium & Thyroid    - all  Normal / OK  <>*<>*<>*<>*<>*<>*<>*<>*<>*<>*<>*<>*<>*<>*<>*<>*<>*<>*<>*<>*<>*<>*<>*<>*<> <>*<>*<>*<>*<>*<>*<>*<>*<>*<>*<>*<>*<>*<>*<>*<>*<>*<>*<>*<>*<>*<>*<>*<>*<>

## 2023-05-25 ENCOUNTER — Encounter: Payer: Self-pay | Admitting: Internal Medicine

## 2023-05-29 DIAGNOSIS — M545 Low back pain, unspecified: Secondary | ICD-10-CM | POA: Diagnosis not present

## 2023-06-02 ENCOUNTER — Encounter: Payer: Self-pay | Admitting: Internal Medicine

## 2023-06-03 DIAGNOSIS — H2513 Age-related nuclear cataract, bilateral: Secondary | ICD-10-CM | POA: Diagnosis not present

## 2023-06-19 DIAGNOSIS — M545 Low back pain, unspecified: Secondary | ICD-10-CM | POA: Diagnosis not present

## 2023-06-27 DIAGNOSIS — Z1231 Encounter for screening mammogram for malignant neoplasm of breast: Secondary | ICD-10-CM | POA: Diagnosis not present

## 2023-07-01 ENCOUNTER — Encounter: Payer: Self-pay | Admitting: Hematology and Oncology

## 2023-08-12 DIAGNOSIS — D252 Subserosal leiomyoma of uterus: Secondary | ICD-10-CM | POA: Diagnosis not present

## 2023-09-03 NOTE — Progress Notes (Signed)
 Medicare Wellness and Follow Up  Assessment and Plan:  Medicare Wellness  Due annually Health maintenance reviewed  Hyperlipidemia, unspecified hyperlipidemia type Discussed lifestyle modifications. Recommended diet heavy in fruits and veggies, omega 3's. Decrease consumption of animal meats, cheeses, and dairy products. Remain active and exercise as tolerated. Continue to monitor. Check lipids/TSH  Abnormal glucose Education: Reviewed 'ABCs' of diabetes management  Discussed goals to be met and/or maintained include A1C (<7) Blood pressure (<130/80) Cholesterol (LDL <70) Continue Eye Exam yearly  Continue Dental Exam Q6 mo Discussed dietary recommendations Discussed Physical Activity recommendations Check A1C  Vitamin D  deficiency Continue supplement for goal of 60-100 Monitor Vitamin D  levels  Family history of melanoma Monitor skin - follow dermatology as directed.  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 Odean   Overweight Discussed appropriate BMI Diet modification. Physical activity. Encouraged/praised to build confidence.  Medication management All medications discussed and reviewed in full. All questions and concerns regarding medications addressed.     Orders Placed This Encounter  Procedures   CBC with Differential/Platelet   COMPLETE METABOLIC PANEL WITH GFR   Lipid panel   Hemoglobin A1c   Notify office for further evaluation and treatment, questions or concerns if any reported s/s fail to improve.   The patient was advised to call back or seek an in-person evaluation if any symptoms worsen or if the condition fails to improve as anticipated.   Further disposition pending results of labs. Discussed med's effects and SE's.     I discussed the assessment and treatment plan with the patient. The patient was provided an opportunity to ask questions and all were answered. The patient agreed with the plan and demonstrated an understanding of the instructions.  Discussed med's effects and SE's. Screening labs and tests as requested with regular follow-up as recommended.  I provided 40 minutes of face-to-face time during this encounter including counseling, chart review, and critical decision making was preformed.  Today's Plan of Care is based on a patient-centered health care approach known as shared decision making - the decisions, tests and treatments allow for patient preferences and values to be balanced with clinical evidence.     Future Appointments  Date Time Provider Department Center  12/04/2023  2:00 PM Laurice President, NP [facility] None  03/11/2024  2:00 PM Odean Potts, MD [facility] None  03/18/2024  3:45 PM Laurice President, NP [facility] None  06/24/2024  2:30 PM Tonita Fallow, MD [facility] None  09/07/2024  3:00 PM Laurice President, NP [facility] None    HPI  68 y.o. female  presents for an annual medicare wellness visit.  Overall she reports feeling well today.  She has no new concerns at this time.  She follows with Dr. 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  Initial diagnosis 02/2017.  Had right lumpectomy 03/19/2017.  Treated with Adriamycin , Cytoxan  and Taxol , adjuvant radiation (09/25/2017 - 10/15/2017).  Started on Letrozole  11/28/2017. Some associated leg cramping and depression.  She continues CBD.She continues to follow up yearly.   She followed up with [facility]  for annual skin check d/t family hx melanoma.  No concerns.    Her blood pressure has been controlled at home, today their BP is BP: 136/70  BP Readings from Last 3 Encounters:  09/04/23  136/70  05/22/23 128/70  05/01/23 120/78  She does workout. She  denies chest pain,  dizziness.   She has a history of kidney stones with last treatment through [facility] of laser. Has had persistent pain and still has several kidney stones.She continues to follow with urology and returns next week for further evaluation.   She stopped her menstrual cycle at age 74. She was found to have uterine fibroids on CT. She is not having any current abdominal pain or dysfunctional bleeding.    Today: BMI is Body mass index is 25.52 kg/m., she is working on diet and exercise. Wt Readings from Last 3 Encounters:  09/04/23 172 lb 12.8 oz (78.4 kg)  05/22/23 173 lb 12.8 oz (78.8 kg)  03/06/23 172 lb 12.8 oz (78.4 kg)   She is on ativan  PRN for sleep, takes every 2 weeks but does not help.  States she needs 4-5 hours of sleep a night and feels rested with that.   She is on cholesterol medication, not fasting and denies myalgias. Her cholesterol is not at goal. The cholesterol last visit was:  Lab Results  Component Value Date   CHOL 233 (H) 05/22/2023   HDL 66 05/22/2023   LDLCALC 143 (H) 05/22/2023   TRIG 121 05/22/2023   CHOLHDL 3.5 05/22/2023  . She has been working on diet and exercise for prediabetes,  and denies foot ulcerations, hyperglycemia, hypoglycemia , increased appetite, nausea, paresthesia of the feet, polydipsia, polyuria, visual disturbances, vomiting and weight loss. Last A1C in the office was:  Lab Results  Component Value Date   HGBA1C 5.7 (H) 05/22/2023   Patient is on Vitamin D  supplement, she can not tolerate Vitamin D  supplementation, has tried all forms but they give her a headache.  Lab Results  Component Value Date   VD25OH 29 (L) 05/22/2023       Current Medications:  Current Outpatient Medications on File Prior to Visit  Medication Sig Dispense Refill   albuterol  (VENTOLIN  HFA) 108 (90 Base) MCG/ACT inhaler Inhale 2 puffs into the lungs every 6 (six) hours as needed for wheezing or shortness of breath. 8 g 2   letrozole   (FEMARA ) 2.5 MG tablet TAKE ONE TABLET BY MOUTH DAILY 90 tablet 3   traZODone  (DESYREL ) 150 MG tablet Take  1/3 to 1/2 to 1 tablet    1 to 2 hours  before Bedtime  as Needed for Sleep 90 tablet 0   hydrOXYzine  (VISTARIL ) 25 MG capsule Take 1 capsule (25 mg total) by mouth at bedtime as needed. (Patient not taking: Reported on 09/04/2023) 30 capsule 0   Ibuprofen 200 MG CAPS Advil (Patient not taking: Reported on 09/04/2023)     LORazepam  (ATIVAN ) 0.5 MG tablet TAKE ONE TABLET BY MOUTH EVERY NIGHT AT BEDTIME AS NEEDED (Patient not taking: Reported on 09/04/2023) 30 tablet 3   nystatin  cream (MYCOSTATIN ) Apply 1 Application topically 2 (two) times daily. (Patient not taking: Reported on 09/04/2023) 30 g 0   phentermine  (ADIPEX-P ) 37.5 MG tablet Take 1/2 to 1 tablet every Morning for Dieting & Weight Loss (Patient not taking: Reported on 09/04/2023) 90 tablet 1   topiramate  (TOPAMAX ) 50 MG tablet Take 1/2 to 1 tablet 2 x /day at Suppertime & Bedtime for Dieting & Weight Loss (Patient not taking: Reported on 09/04/2023) 180 tablet 1   No current facility-administered medications on file prior to visit.    Health Maintenance:   Immunization History  Administered Date(s) Administered   Influenza Inj  Mdck Quad With Preservative 06/26/2018   Influenza,inj,Quad PF,6+ Mos 06/25/2017   Td 08/27/2007   Tdap 03/16/2018   Tetanus: 2019 Flu vaccine:2018 Pap: 09/2021 [facility]  06/2023 DEXA: Dr. Tawnya 2019 AT [facility] Colonoscopy: 2019, Reach out to Dr. Burnette to confirm next due date. Last Eye Exam: DUE Echo 2018 Dental Exam: DUE   Patient Care Team: Tonita Fallow, MD as PCP - General (Internal Medicine) Lavona Agent, MD as PCP - Cardiology (Cardiology) Tonita Fallow, MD (Internal Medicine) Burnette Fallow, MD as Consulting Physician (Gastroenterology) Leva Rush, MD as Consulting Physician (Obstetrics and Gynecology) Odean Potts, MD as Consulting Physician (Hematology and Oncology) Izell Domino, MD  as Attending Physician (Radiation Oncology) Mikell Katz, MD (Inactive) as Consulting Physician (General Surgery)  Medical History:  Past Medical History:  Diagnosis Date   Depression    REMISSION/ SITUATIONAL   Dyspnea    Hepatitis AGE 32   RESOLVED   History of kidney stones    History of radiation therapy 09/24/17- 10/15/17   Right Breast treated to 42.56 Gy with 16 fx of 2.66 Gy   Hyperlipidemia    Insomnia    Migraines    HORMONAL   Personal history of chemotherapy    Personal history of radiation therapy    PONV (postoperative nausea and vomiting)    right breast ca dx' d 02/2017   Shingles outbreak 2003, 2015   RIGHT HIP, and between breasts 3 years ago    Allergies Allergies  Allergen Reactions   Cholecalciferol Other (See Comments)    Headache   Citalopram Other (See Comments)    HYPERACTIVITY   Dexamethasone      Chest tightness   Dilaudid [Hydromorphone Hcl] Nausea And Vomiting   Propoxycaine Nausea And Vomiting   Red Yeast Rice [Cholestin] Other (See Comments)    headache   Rofecoxib Nausea And Vomiting and Nausea Only   Sulfa Antibiotics Swelling   Sulfamethoxazole Swelling    SURGICAL HISTORY She  has a past surgical history that includes Knee arthroscopy (Right, 2002); Tubal ligation; uterine polyps (2016); Breast lumpectomy with radioactive seed and sentinel lymph node biopsy (Right, 03/19/2017); Breast lumpectomy (Right, 02/2017); Breast excisional biopsy (Right); Cystoscopy with retrograde pyelogram, ureteroscopy and stent placement (Bilateral, 08/31/2021); and Holmium laser application (Bilateral, 08/31/2021). FAMILY HISTORY Her family history includes Breast cancer in her sister; Cancer in her brother and paternal grandfather; Cancer (age of onset: 83) in her sister; Cancer (age of onset: 75) in her father; Cervical cancer in her maternal aunt; Dementia in her maternal aunt and mother; Depression in her brother; Heart disease in her maternal  grandfather; Lung cancer in her maternal uncle; Lung cancer (age of onset: 67) in her brother; Other in her paternal grandmother; Stroke (age of onset: 24) in her maternal grandmother. SOCIAL HISTORY She  reports that she quit smoking about 15 years ago. Her smoking use included cigarettes. She has never used smokeless tobacco. She reports that she does not drink alcohol and does not use drugs. 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    Review of Systems: Review of Systems  Constitutional:  Negative for chills, fever and malaise/fatigue.  HENT:  Negative for congestion, ear pain and sore throat.   Eyes: Negative.  Negative for blurred vision and double vision.  Respiratory:  Positive for shortness of breath. Negative for cough, hemoptysis, sputum production and wheezing.   Cardiovascular:  Negative for chest pain, palpitations, orthopnea, claudication, leg swelling and PND.  Gastrointestinal:  Negative for abdominal  pain, blood in stool, constipation, diarrhea, heartburn and melena.  Musculoskeletal:  Negative for back pain, joint pain and neck pain.  Skin: Negative.   Neurological:  Negative for dizziness, sensory change, loss of consciousness and headaches.  Psychiatric/Behavioral:  Negative for depression, hallucinations, substance abuse and suicidal ideas. The patient is nervous/anxious. The patient does not have insomnia.    MEDICARE WELLNESS OBJECTIVES: Physical activity:   Cardiac risk factors:   Depression/mood screen:      05/25/2023    7:37 PM  Depression screen PHQ 2/9  Decreased Interest 0  Down, Depressed, Hopeless 0  PHQ - 2 Score 0    ADLs:     09/04/2023    3:09 PM 05/25/2023    7:35 PM  In your present state of health, do you have any difficulty performing the following activities:  Hearing? 0 0  Vision? 0 0  Difficulty concentrating or making decisions? 0 0  Walking or climbing stairs? 0 0  Dressing or bathing? 0 0  Doing errands, shopping? 0 0      Cognitive Testing  Alert? Yes  Normal Appearance?Yes  Oriented to person? Yes  Place? Yes   Time? Yes  Recall of three objects?  Yes  Can perform simple calculations? Yes  Displays appropriate judgment?Yes  Can read the correct time from a watch face?Yes  EOL planning:       Physical Exam: Estimated body mass index is 25.52 kg/m as calculated from the following:   Height as of this encounter: 5' 9 (1.753 m).   Weight as of this encounter: 172 lb 12.8 oz (78.4 kg). BP 136/70   Pulse 63   Temp 97.8 F (36.6 C)   Ht 5' 9 (1.753 m)   Wt 172 lb 12.8 oz (78.4 kg)   LMP 08/03/2013   SpO2 98%   BMI 25.52 kg/m   General Appearance: Well nourished well developed, in no apparent distress.  Eyes: PERRLA, EOMs, conjunctiva no swelling or erythema ENT/Mouth: Ear canals normal without obstruction, swelling, erythema, or discharge.  TMs normal bilaterally with no erythema, bulging, retraction, or loss of landmark.  Oropharynx moist and clear with no exudate, erythema, or swelling.   Neck: Supple, thyroid  normal. No bruits.  No cervical adenopathy Respiratory: Respiratory effort normal, + wheezing RML without rhonchi, rales.   Cardio: RRR without murmurs, rubs or gallops. Brisk peripheral pulses without edema.  Chest: symmetric, with normal excursions Abdomen: Soft, nontender, no guarding, rebound, hernias, masses, or organomegaly.  Lymphatics: Non tender without lymphadenopathy.  Musculoskeletal: Full ROM all peripheral extremities,5/5 strength, and normal gait.  Skin: Warm, dry without rashes, lesions, ecchymosis. Neuro: Awake and oriented X 3, Cranial nerves intact, reflexes equal bilaterally. Normal muscle tone, no cerebellar symptoms. Sensation intact.  Psych:  normal affect, Insight and Judgment appropriate.    Medicare Attestation I have personally reviewed: The patient's medical and social history Their use of alcohol, tobacco or illicit drugs Their current medications  and supplements The patient's functional ability including ADLs,fall risks, home safety risks, cognitive, and hearing and visual impairment Diet and physical activities Evidence for depression or mood disorders  The patient's weight, height, BMI, and visual acuity have been recorded in the chart.  I have made referrals, counseling, and provided education to the patient based on review of the above and I have provided the patient with a written personalized care plan for preventive services.      BASCOM NECESSARY, NP 4:41 PM [facility]

## 2023-09-04 ENCOUNTER — Ambulatory Visit (INDEPENDENT_AMBULATORY_CARE_PROVIDER_SITE_OTHER): Payer: Medicare Other | Admitting: Nurse Practitioner

## 2023-09-04 ENCOUNTER — Encounter: Payer: Self-pay | Admitting: Nurse Practitioner

## 2023-09-04 VITALS — BP 136/70 | HR 63 | Temp 97.8°F | Ht 69.0 in | Wt 172.8 lb

## 2023-09-04 DIAGNOSIS — E782 Mixed hyperlipidemia: Secondary | ICD-10-CM

## 2023-09-04 DIAGNOSIS — C50919 Malignant neoplasm of unspecified site of unspecified female breast: Secondary | ICD-10-CM

## 2023-09-04 DIAGNOSIS — R7309 Other abnormal glucose: Secondary | ICD-10-CM | POA: Diagnosis not present

## 2023-09-04 DIAGNOSIS — Z0000 Encounter for general adult medical examination without abnormal findings: Secondary | ICD-10-CM

## 2023-09-04 DIAGNOSIS — G43809 Other migraine, not intractable, without status migrainosus: Secondary | ICD-10-CM | POA: Diagnosis not present

## 2023-09-04 DIAGNOSIS — E559 Vitamin D deficiency, unspecified: Secondary | ICD-10-CM

## 2023-09-04 DIAGNOSIS — Z808 Family history of malignant neoplasm of other organs or systems: Secondary | ICD-10-CM | POA: Diagnosis not present

## 2023-09-04 DIAGNOSIS — Z79899 Other long term (current) drug therapy: Secondary | ICD-10-CM

## 2023-09-04 DIAGNOSIS — R6889 Other general symptoms and signs: Secondary | ICD-10-CM | POA: Diagnosis not present

## 2023-09-04 DIAGNOSIS — E663 Overweight: Secondary | ICD-10-CM

## 2023-09-04 DIAGNOSIS — Z0001 Encounter for general adult medical examination with abnormal findings: Secondary | ICD-10-CM | POA: Diagnosis not present

## 2023-09-04 NOTE — Patient Instructions (Addendum)
 Try a Vitamin D2 for replacement of Vitamin D3 in hopes to reduce side effects of headache.  Healthy Eating, Adult Healthy eating may help you get and keep a healthy body weight, reduce the risk of chronic disease, and live a long and productive life. It is important to follow a healthy eating pattern. Your nutritional and calorie needs should be met mainly by different nutrient-rich foods. What are tips for following this plan? Reading food labels Read labels and choose the following: Reduced or low sodium products. Juices with 100% fruit juice. Foods with low saturated fats (<3 g per serving) and high polyunsaturated and monounsaturated fats. Foods with whole grains, such as whole wheat, cracked wheat, brown rice, and wild rice. Whole grains that are fortified with folic acid. This is recommended for females who are pregnant or who want to become pregnant. Read labels and do not eat or drink the following: Foods or drinks with added sugars. These include foods that contain brown sugar, corn sweetener, corn syrup, dextrose , fructose, glucose, high-fructose corn syrup, honey, invert sugar, lactose, malt syrup, maltose, molasses, raw sugar, sucrose, trehalose, or turbinado sugar. Limit your intake of added sugars to less than 10% of your total daily calories. Do not eat more than the following amounts of added sugar per day: 6 teaspoons (25 g) for females. 9 teaspoons (38 g) for males. Foods that contain processed or refined starches and grains. Refined grain products, such as white flour, degermed cornmeal, white bread, and white rice. Shopping Choose nutrient-rich snacks, such as vegetables, whole fruits, and nuts. Avoid high-calorie and high-sugar snacks, such as potato chips, fruit snacks, and candy. Use oil-based dressings and spreads on foods instead of solid fats such as butter, margarine, sour cream, or cream cheese. Limit pre-made sauces, mixes, and instant products such as flavored  rice, instant noodles, and ready-made pasta. Try more plant-protein sources, such as tofu, tempeh, black beans, edamame, lentils, nuts, and seeds. Explore eating plans such as the Mediterranean diet or vegetarian diet. Try heart-healthy dips made with beans and healthy fats like hummus and guacamole. Vegetables go great with these. Cooking Use oil to saut or stir-fry foods instead of solid fats such as butter, margarine, or lard. Try baking, boiling, grilling, or broiling instead of frying. Remove the fatty part of meats before cooking. Steam vegetables in water or broth. Meal planning  At meals, imagine dividing your plate into fourths: One-half of your plate is fruits and vegetables. One-fourth of your plate is whole grains. One-fourth of your plate is protein, especially lean meats, poultry, eggs, tofu, beans, or nuts. Include low-fat dairy as part of your daily diet. Lifestyle Choose healthy options in all settings, including home, work, school, restaurants, or stores. Prepare your food safely: Wash your hands after handling raw meats. Where you prepare food, keep surfaces clean by regularly washing with hot, soapy water. Keep raw meats separate from ready-to-eat foods, such as fruits and vegetables. Cook seafood, meat, poultry, and eggs to the recommended temperature. Get a food thermometer. Store foods at safe temperatures. In general: Keep cold foods at 27F (4.4C) or below. Keep hot foods at 127F (60C) or above. Keep your freezer at [facility] (-17.8C) or below. Foods are not safe to eat if they have been between the temperatures of 40-127F (4.4-60C) for more than 2 hours. What foods should I eat? Fruits Aim to eat 1-2 cups of fresh, canned (in natural juice), or frozen fruits each day. One cup of fruit equals 1 small apple,  1 large banana, 8 large strawberries, 1 cup (237 g) canned fruit,  cup (82 g) dried fruit, or 1 cup (240 mL) 100% juice. Vegetables Aim to eat 2-4 cups  of fresh and frozen vegetables each day, including different varieties and colors. One cup of vegetables equals 1 cup (91 g) broccoli or cauliflower florets, 2 medium carrots, 2 cups (150 g) raw, leafy greens, 1 large tomato, 1 large bell pepper, 1 large sweet potato, or 1 medium white potato. Grains Aim to eat 5-10 ounce-equivalents of whole grains each day. Examples of 1 ounce-equivalent of grains include 1 slice of bread, 1 cup (40 g) ready-to-eat cereal, 3 cups (24 g) popcorn, or  cup (93 g) cooked rice. Meats and other proteins Try to eat 5-7 ounce-equivalents of protein each day. Examples of 1 ounce-equivalent of protein include 1 egg,  oz nuts (12 almonds, 24 pistachios, or 7 walnut halves), 1/4 cup (90 g) cooked beans, 6 tablespoons (90 g) hummus or 1 tablespoon (16 g) peanut butter. A cut of meat or fish that is the size of a deck of cards is about 3-4 ounce-equivalents (85 g). Of the protein you eat each week, try to have at least 8 sounce (227 g) of seafood. This is about 2 servings per week. This includes salmon, trout, herring, sardines, and anchovies. Dairy Aim to eat 3 cup-equivalents of fat-free or low-fat dairy each day. Examples of 1 cup-equivalent of dairy include 1 cup (240 mL) milk, 8 ounces (250 g) yogurt, 1 ounces (44 g) natural cheese, or 1 cup (240 mL) fortified soy milk. Fats and oils Aim for about 5 teaspoons (21 g) of fats and oils per day. Choose monounsaturated fats, such as canola and olive oils, mayonnaise made with olive oil or avocado oil, avocados, peanut butter, and most nuts, or polyunsaturated fats, such as sunflower, corn, and soybean oils, walnuts, pine nuts, sesame seeds, sunflower seeds, and flaxseed. Beverages Aim for 6 eight-ounce glasses of water per day. Limit coffee to 3-5 eight-ounce cups per day. Limit caffeinated beverages that have added calories, such as soda and energy drinks. If you drink alcohol: Limit how much you have to: 0-1 drink a day if  you are female. 0-2 drinks a day if you are female. Know how much alcohol is in your drink. In the [place] one drink is one 12 oz bottle of beer (355 mL), one 5 oz glass of wine (148 mL), or one 1 oz glass of hard liquor (44 mL). Seasoning and other foods Try not to add too much salt to your food. Try using herbs and spices instead of salt. Try not to add sugar to food. This information is based on [place] nutrition guidelines. To learn more, visit disposablenylon.be. Exact amounts may vary. You may need different amounts. This information is not intended to replace advice given to you by your health care provider. Make sure you discuss any questions you have with your health care provider. Document Revised: 05/13/2022 Document Reviewed: 05/13/2022 Elsevier Patient Education  2024 [employer].

## 2023-09-05 LAB — CBC WITH DIFFERENTIAL/PLATELET
Absolute Lymphocytes: 1925 {cells}/uL (ref 850–3900)
Absolute Monocytes: 427 {cells}/uL (ref 200–950)
Basophils Absolute: 49 {cells}/uL (ref 0–200)
Basophils Relative: 0.7 %
Eosinophils Absolute: 70 {cells}/uL (ref 15–500)
Eosinophils Relative: 1 %
HCT: 39.1 % (ref 35.0–45.0)
Hemoglobin: 13.2 g/dL (ref 11.7–15.5)
MCH: 30.6 pg (ref 27.0–33.0)
MCHC: 33.8 g/dL (ref 32.0–36.0)
MCV: 90.7 fL (ref 80.0–100.0)
MPV: 11.3 fL (ref 7.5–12.5)
Monocytes Relative: 6.1 %
Neutro Abs: 4529 {cells}/uL (ref 1500–7800)
Neutrophils Relative %: 64.7 %
Platelets: 229 10*3/uL (ref 140–400)
RBC: 4.31 10*6/uL (ref 3.80–5.10)
RDW: 11.7 % (ref 11.0–15.0)
Total Lymphocyte: 27.5 %
WBC: 7 10*3/uL (ref 3.8–10.8)

## 2023-09-05 LAB — LIPID PANEL
Cholesterol: 239 mg/dL — ABNORMAL HIGH (ref <200)
HDL: 67 mg/dL (ref >=50)
LDL Cholesterol (Calc): 154 mg/dL — ABNORMAL HIGH
Non-HDL Cholesterol (Calc): 172 mg/dL — ABNORMAL HIGH (ref <130)
Total CHOL/HDL Ratio: 3.6 (calc) (ref <5.0)
Triglycerides: 81 mg/dL (ref <150)

## 2023-09-05 LAB — COMPLETE METABOLIC PANEL WITH GFR
AG Ratio: 1.8 (calc) (ref 1.0–2.5)
ALT: 13 U/L (ref 6–29)
AST: 16 U/L (ref 10–35)
Albumin: 4.5 g/dL (ref 3.6–5.1)
Alkaline phosphatase (APISO): 73 U/L (ref 37–153)
BUN: 18 mg/dL (ref 7–25)
CO2: 27 mmol/L (ref 20–32)
Calcium: 9.3 mg/dL (ref 8.6–10.4)
Chloride: 103 mmol/L (ref 98–110)
Creat: 0.57 mg/dL (ref 0.50–1.05)
Globulin: 2.5 g/dL (ref 1.9–3.7)
Glucose, Bld: 85 mg/dL (ref 65–99)
Potassium: 4.2 mmol/L (ref 3.5–5.3)
Sodium: 139 mmol/L (ref 135–146)
Total Bilirubin: 0.7 mg/dL (ref 0.2–1.2)
Total Protein: 7 g/dL (ref 6.1–8.1)
eGFR: 100 mL/min/{1.73_m2} (ref >=60)

## 2023-09-05 LAB — HEMOGLOBIN A1C
Hgb A1c MFr Bld: 5.6 %{Hb} (ref <5.7)
Mean Plasma Glucose: 114 mg/dL
eAG (mmol/L): 6.3 mmol/L

## 2023-09-07 ENCOUNTER — Other Ambulatory Visit: Payer: Self-pay

## 2023-09-07 ENCOUNTER — Emergency Department (HOSPITAL_BASED_OUTPATIENT_CLINIC_OR_DEPARTMENT_OTHER): Payer: Medicare Other

## 2023-09-07 ENCOUNTER — Emergency Department (HOSPITAL_BASED_OUTPATIENT_CLINIC_OR_DEPARTMENT_OTHER)
Admission: EM | Admit: 2023-09-07 | Discharge: 2023-09-08 | Disposition: A | Discharge status: Home / Self Care | Payer: Medicare Other | Attending: Emergency Medicine | Admitting: Emergency Medicine

## 2023-09-07 DIAGNOSIS — W010XXA Fall on same level from slipping, tripping and stumbling without subsequent striking against object, initial encounter: Secondary | ICD-10-CM | POA: Insufficient documentation

## 2023-09-07 DIAGNOSIS — W19XXXA Unspecified fall, initial encounter: Secondary | ICD-10-CM

## 2023-09-07 DIAGNOSIS — Z853 Personal history of malignant neoplasm of breast: Secondary | ICD-10-CM | POA: Insufficient documentation

## 2023-09-07 DIAGNOSIS — Y92481 Parking lot as the place of occurrence of the external cause: Secondary | ICD-10-CM | POA: Insufficient documentation

## 2023-09-07 DIAGNOSIS — S065X9A Traumatic subdural hemorrhage with loss of consciousness of unspecified duration, initial encounter: Secondary | ICD-10-CM | POA: Diagnosis not present

## 2023-09-07 DIAGNOSIS — S065XAA Traumatic subdural hemorrhage with loss of consciousness status unknown, initial encounter: Secondary | ICD-10-CM | POA: Diagnosis not present

## 2023-09-07 DIAGNOSIS — S06360A Traumatic hemorrhage of cerebrum, unspecified, without loss of consciousness, initial encounter: Secondary | ICD-10-CM | POA: Diagnosis not present

## 2023-09-07 DIAGNOSIS — R220 Localized swelling, mass and lump, head: Secondary | ICD-10-CM | POA: Diagnosis not present

## 2023-09-07 DIAGNOSIS — S0990XA Unspecified injury of head, initial encounter: Secondary | ICD-10-CM | POA: Diagnosis not present

## 2023-09-07 DIAGNOSIS — R55 Syncope and collapse: Secondary | ICD-10-CM | POA: Diagnosis not present

## 2023-09-07 MED ORDER — ACETAMINOPHEN 325 MG PO TABS
650.0000 mg | ORAL_TABLET | Freq: Once | ORAL | Status: AC
  Administered 2023-09-07: 650 mg via ORAL
  Filled 2023-09-07: qty 2

## 2023-09-07 MED ORDER — LEVETIRACETAM 500 MG PO TABS
500.0000 mg | ORAL_TABLET | Freq: Once | ORAL | Status: AC
  Administered 2023-09-07: 500 mg via ORAL
  Filled 2023-09-07: qty 1

## 2023-09-07 MED ORDER — LEVETIRACETAM 500 MG PO TABS: 500.0000 mg | ORAL_TABLET | Freq: Two times a day (BID) | ORAL | 0 refills | Status: DC

## 2023-09-07 NOTE — ED Triage Notes (Signed)
 Pt via pov from home after a fall on the ice and hitting the back of her head. She reports that fall was witnessed and that she did lose consciousness. Pt has a swollen area to the posterior skull. Pt alert & oriented, nad noted.

## 2023-09-07 NOTE — ED Provider Notes (Signed)
 [place] EMERGENCY DEPARTMENT AT [facility] Provider Note   CSN: [id number] Arrival date & time: 09/07/23  1624     History  Chief Complaint  Patient presents with   Fall   Loss of Consciousness    Rebecca [name] is a 68 y.o. female.  Patient with history of breast cancer, no anticoagulation presents to the emergency department after a slip and fall on ice.  [place] [employer] [place], going to see her granddaughter's soccer game, when she slipped on ice in the parking lot.  She fell backwards striking the back of her head.  She sustained a hematoma.  She had brief loss of consciousness.  This was witnessed by bystanders whom patient states said that her head bounced off the ground.  She has headache.  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  No other treatments prior to arrival.  No weakness, numbness, or tingling in the arms of the legs.  No confusion, nausea or vomiting. 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           Home Medications Prior to Admission medications   Medication Sig Start Date End Date Taking? Authorizing Provider  albuterol  (VENTOLIN  HFA) 108 (90 Base) MCG/ACT inhaler Inhale 2 puffs into the lungs every 6 (six) hours as needed for wheezing or shortness of breath. 11/08/22   Cranford, Tonya, NP  hydrOXYzine  (VISTARIL ) 25 MG capsule Take 1 capsule (25 mg total) by mouth at bedtime as needed. Patient not taking: Reported on 09/04/2023 05/01/23   Cranford, Tonya, NP  Ibuprofen 200 MG CAPS Advil Patient not taking: Reported on 09/04/2023    [provider]  letrozole  (FEMARA ) 2.5 MG tablet TAKE ONE TABLET BY MOUTH DAILY 09/25/22   Odean Potts, MD  LORazepam  (ATIVAN ) 0.5 MG tablet TAKE ONE TABLET BY MOUTH EVERY NIGHT AT BEDTIME AS NEEDED Patient not taking: Reported on 09/04/2023 11/25/22   Gudena, Vinay, MD  nystatin  cream (MYCOSTATIN ) Apply 1 Application topically 2 (two) times  daily. Patient not taking: Reported on 09/04/2023 05/01/23   Cranford, Tonya, NP  phentermine  (ADIPEX-P ) 37.5 MG tablet Take 1/2 to 1 tablet every Morning for Dieting & Weight Loss Patient not taking: Reported on 09/04/2023 05/22/23   Tonita Fallow, MD  topiramate  (TOPAMAX ) 50 MG tablet Take 1/2 to 1 tablet 2 x /day at Suppertime & Bedtime for Dieting & Weight Loss Patient not taking: Reported on 09/04/2023 05/22/23   Tonita Fallow, MD  traZODone  (DESYREL ) 150 MG tablet Take  1/3 to 1/2 to 1 tablet    1 to 2 hours  before Bedtime  as Needed for Sleep 05/22/23   Tonita Fallow, MD      Allergies    Cholecalciferol, Citalopram, Dexamethasone , Dilaudid [hydromorphone hcl], Propoxycaine, Red yeast rice [cholestin], Rofecoxib, Sulfa antibiotics, and Sulfamethoxazole    Review of Systems   Review of Systems  Physical Exam Updated Vital Signs BP (!) 152/75 (BP Location: Left Arm)   Pulse 77   Temp 97.6 F (36.4 C)   Resp 16   Ht 5' 9 (1.753 m)   Wt 78.4 kg   LMP 08/03/2013   SpO2 98%   BMI 25.52 kg/m  Physical Exam Vitals and nursing note reviewed.  Constitutional:      Appearance: She is well-developed.  HENT:     Head: Normocephalic. No raccoon eyes or Battle's sign.     Comments: Right-sided posterior scalp hematoma  Right Ear: Tympanic membrane, ear canal and external ear normal. No hemotympanum.     Left Ear: Tympanic membrane, ear canal and external ear normal. No hemotympanum.     Nose: Nose normal.     Mouth/Throat:     Pharynx: Uvula midline.  Eyes:     General: Lids are normal.     Extraocular Movements:     Right eye: No nystagmus.     Left eye: No nystagmus.     Conjunctiva/sclera: Conjunctivae normal.     Pupils: Pupils are equal, round, and reactive to light.     Comments: No visible hyphema noted  Cardiovascular:     Rate and Rhythm: Normal rate and regular rhythm.  Pulmonary:     Effort: Pulmonary effort is normal.     Breath sounds: Normal breath sounds.   Abdominal:     Palpations: Abdomen is soft.     Tenderness: There is no abdominal tenderness.  Musculoskeletal:     Cervical back: Normal range of motion and neck supple. Tenderness present. No bony tenderness. Normal range of motion.     Thoracic back: No tenderness or bony tenderness.     Lumbar back: No tenderness or bony tenderness.  Skin:    General: Skin is warm and dry.  Neurological:     Mental Status: She is alert and oriented to person, place, and time.     GCS: GCS eye subscore is 4. GCS verbal subscore is 5. GCS motor subscore is 6.     Cranial Nerves: No cranial nerve deficit.     Sensory: No sensory deficit.     Coordination: Coordination normal.     ED Results / Procedures / Treatments   Labs (all labs ordered are listed, but only abnormal results are displayed) Labs Reviewed - No data to display  EKG EKG Interpretation Date/Time:  Sunday September 07 2023 16:41:58 EST Ventricular Rate:  72 PR Interval:  164 QRS Duration:  98 QT Interval:  382 QTC Calculation: 418 R Axis:   69  Text Interpretation: Normal sinus rhythm Incomplete right bundle branch block Borderline ECG No previous ECGs available Confirmed by Cottie Cough [phone]) on 09/07/2023 5:12:37 PM  Radiology No results found.  Procedures Procedures    Medications Ordered in ED Medications  acetaminophen  (TYLENOL ) tablet 650 mg (650 mg Oral Given 09/07/23 1750)  levETIRAcetam  (KEPPRA ) tablet 500 mg (500 mg Oral Given 09/07/23 1842)    ED Course/ Medical Decision Making/ A&P Clinical Course as of 09/07/23 1855  [place] Sep 07, 2023  1839 This is a 73 old female who presented today with a mechanical fall and isolated injury to the back of the head.  She has a hematoma of her occiput.  Some mild headache and intermittent blurred vision, otherwise neurologically intact.  CT imaging was concerning for a small subdural hemorrhage.  Case was discussed with neurosurgery, see PA note, recommending repeat CT  scan in 8 hours and keppra  BID for 7 days.  With the patient preferring to avoid hospitalization which she will be monitored here in the ED for repeat scan, and if it is stable she can be discharged outpatient follow-up.  No other traumatic injuries noted on exam.  Her husband was present at the bedside for history and plan.  Doubt spinal fracture.  Patient is not anticoagulated [MT]    Clinical Course User Index [MT] Trifan, Cough PARAS, MD    Patient seen and examined. History obtained directly from patient.   Labs/EKG:  None ordered  Imaging: Ordered head without contrast.  Medications/Fluids: Ordered: P.o. Tylenol .   Most recent vital signs reviewed and are as follows: BP 136/71   Pulse 67   Temp 97.6 F (36.4 C)   Resp 16   Ht 5' 9 (1.753 m)   Wt 78.4 kg   LMP 08/03/2013   SpO2 97%   BMI 25.52 kg/m   Initial impression: Head injury, positive loss of consciousness, no anticoagulation  5:58 PM received call from neurologist, CT positive for 3 mm SDH falx, left-sided.  I spoke with APP Johnanna of NSG.  Recommendation for rescan in 8 hours, Keppra  500mg  twice daily for 1 week.  Meds and repeat CT ordered.  Patient to stay in the emergency department until it is time for the CT scan.  Dr. Cottie aware of patient at shift change.                                Medical Decision Making Amount and/or Complexity of Data Reviewed Radiology: ordered.   Patient with mechanical fall with head injury.  No anticoagulation.  She was found to have a small left falx subdural hematoma on CT.  Normal neuro exam on arrival.  Neurosurgical recommendations as above.  Currently observing in the ED.        Final Clinical Impression(s) / ED Diagnoses Final diagnoses:  Subdural hematoma (HCC)  Fall, initial encounter    Rx / DC Orders ED Discharge Orders     None         Desiderio Chew, DEVONNA 09/07/23 1857    Cottie Donnice PARAS, MD 09/08/23 [phone]

## 2023-09-07 NOTE — ED Provider Notes (Signed)
  Physical Exam  BP 132/73   Pulse 61   Temp 98.1 F (36.7 C) (Oral)   Resp 16   Ht 5' 9 (1.753 m)   Wt 78.4 kg   LMP 08/03/2013   SpO2 97%   BMI 25.52 kg/m   Physical Exam  Procedures  Procedures  ED Course / MDM   Clinical Course as of 09/07/23 2326  [place] Sep 07, 2023  1839 This is a 1 old female who presented today with a mechanical fall and isolated injury to the back of the head.  She has a hematoma of her occiput.  Some mild headache and intermittent blurred vision, otherwise neurologically intact.  CT imaging was concerning for a small subdural hemorrhage.  Case was discussed with neurosurgery, see PA note, recommending repeat CT scan in 8 hours and keppra  BID for 7 days.  With the patient preferring to avoid hospitalization which she will be monitored here in the ED for repeat scan, and if it is stable she can be discharged outpatient follow-up.  No other traumatic injuries noted on exam.  Her husband was present at the bedside for history and plan.  Doubt spinal fracture.  Patient is not anticoagulated [MT]    Clinical Course User Index [MT] Trifan, Donnice PARAS, MD   Medical Decision Making Amount and/or Complexity of Data Reviewed Radiology: ordered.  Risk OTC drugs. Prescription drug management.   32F, presenting with a small SDH, doesn't want admission, not on AC, repeat 8hr CTH scheduled, discussed case with Neurosurgery.   Repeat CT Head: IMPRESSION:  1. Stable left parafalcine 2-3 mm subdural hematoma.  2. No new findings.   Pt given Tylenol  for headache. The patient was ambulatory in the ED, overall well appearing.  Stable for DC with outpatient neurosurgery follow-up, Keppra  was prescribed.      Jerrol Agent, MD 09/08/23 [phone]

## 2023-09-08 ENCOUNTER — Emergency Department (HOSPITAL_BASED_OUTPATIENT_CLINIC_OR_DEPARTMENT_OTHER): Payer: Medicare Other

## 2023-09-08 ENCOUNTER — Encounter: Payer: Self-pay | Admitting: Nurse Practitioner

## 2023-09-08 DIAGNOSIS — S065X0A Traumatic subdural hemorrhage without loss of consciousness, initial encounter: Secondary | ICD-10-CM | POA: Diagnosis not present

## 2023-09-08 MED ORDER — ACETAMINOPHEN 500 MG PO TABS
1000.0000 mg | ORAL_TABLET | Freq: Once | ORAL | Status: AC
  Administered 2023-09-08: 1000 mg via ORAL
  Filled 2023-09-08: qty 2

## 2023-09-08 MED ORDER — ONDANSETRON 4 MG PO TBDP
4.0000 mg | ORAL_TABLET | Freq: Once | ORAL | Status: DC
  Filled 2023-09-08: qty 1

## 2023-09-08 NOTE — ED Notes (Signed)
 Pt ambulates in hall with steady gait to bathroom. Pt reports dizziness, but states it's better than before. MD notified.

## 2023-09-08 NOTE — Discharge Instructions (Addendum)
 You will need to call to schedule follow-up appointment in 1- 2 weeks with the neurosurgery clinic at the number above.  You will take Keppra as a medication to help protect against seizure for the next 7 days, as prescribed.

## 2023-09-08 NOTE — ED Notes (Signed)
 Pt feeling nauseated. Pt states she thinks because she hasn't been able to eat for hours. MD notified, request for nausea meds prior to discharge.

## 2023-09-09 ENCOUNTER — Other Ambulatory Visit: Payer: Self-pay

## 2023-09-09 ENCOUNTER — Emergency Department (HOSPITAL_BASED_OUTPATIENT_CLINIC_OR_DEPARTMENT_OTHER)
Admission: EM | Admit: 2023-09-09 | Discharge: 2023-09-09 | Disposition: A | Discharge status: Home / Self Care | Payer: Medicare Other | Attending: Emergency Medicine | Admitting: Emergency Medicine

## 2023-09-09 ENCOUNTER — Emergency Department (HOSPITAL_BASED_OUTPATIENT_CLINIC_OR_DEPARTMENT_OTHER): Payer: Medicare Other

## 2023-09-09 ENCOUNTER — Encounter (HOSPITAL_BASED_OUTPATIENT_CLINIC_OR_DEPARTMENT_OTHER): Payer: Self-pay | Admitting: Emergency Medicine

## 2023-09-09 DIAGNOSIS — S065X0D Traumatic subdural hemorrhage without loss of consciousness, subsequent encounter: Secondary | ICD-10-CM | POA: Diagnosis not present

## 2023-09-09 DIAGNOSIS — Z853 Personal history of malignant neoplasm of breast: Secondary | ICD-10-CM | POA: Insufficient documentation

## 2023-09-09 DIAGNOSIS — R42 Dizziness and giddiness: Secondary | ICD-10-CM | POA: Insufficient documentation

## 2023-09-09 DIAGNOSIS — W000XXD Fall on same level due to ice and snow, subsequent encounter: Secondary | ICD-10-CM | POA: Insufficient documentation

## 2023-09-09 DIAGNOSIS — S065X0A Traumatic subdural hemorrhage without loss of consciousness, initial encounter: Secondary | ICD-10-CM | POA: Diagnosis not present

## 2023-09-09 DIAGNOSIS — R519 Headache, unspecified: Secondary | ICD-10-CM | POA: Diagnosis not present

## 2023-09-09 DIAGNOSIS — S0990XD Unspecified injury of head, subsequent encounter: Secondary | ICD-10-CM | POA: Diagnosis not present

## 2023-09-09 LAB — CBC WITH DIFFERENTIAL/PLATELET
Abs Immature Granulocytes: 0 10*3/uL (ref 0.00–0.07)
Basophils Absolute: 0 10*3/uL (ref 0.0–0.1)
Basophils Relative: 0 %
Eosinophils Absolute: 0 10*3/uL (ref 0.0–0.5)
Eosinophils Relative: 0 %
HCT: 40.1 % (ref 36.0–46.0)
Hemoglobin: 13.5 g/dL (ref 12.0–15.0)
Lymphocytes Relative: 17 %
Lymphs Abs: 1.4 10*3/uL (ref 0.7–4.0)
MCH: 30.6 pg (ref 26.0–34.0)
MCHC: 33.7 g/dL (ref 30.0–36.0)
MCV: 90.9 fL (ref 80.0–100.0)
Monocytes Absolute: 0.2 10*3/uL (ref 0.1–1.0)
Monocytes Relative: 3 %
Neutro Abs: 6.5 10*3/uL (ref 1.7–7.7)
Neutrophils Relative %: 80 %
Platelets: 215 10*3/uL (ref 150–400)
RBC Morphology: NONE SEEN
RBC: 4.41 MIL/uL (ref 3.87–5.11)
RDW: 12.2 % (ref 11.5–15.5)
WBC Morphology: ABNORMAL
WBC: 8.1 10*3/uL (ref 4.0–10.5)
nRBC: 0 % (ref 0.0–0.2)

## 2023-09-09 LAB — BASIC METABOLIC PANEL
Anion gap: 8 (ref 5–15)
BUN: 15 mg/dL (ref 8–23)
CO2: 27 mmol/L (ref 22–32)
Calcium: 9 mg/dL (ref 8.9–10.3)
Chloride: 104 mmol/L (ref 98–111)
Creatinine, Ser: 0.66 mg/dL (ref 0.44–1.00)
GFR, Estimated: >60 mL/min (ref >60)
Glucose, Bld: 97 mg/dL (ref 70–99)
Potassium: 4.1 mmol/L (ref 3.5–5.1)
Sodium: 139 mmol/L (ref 135–145)

## 2023-09-09 MED ORDER — METOCLOPRAMIDE HCL 5 MG/ML IJ SOLN
10.0000 mg | Freq: Once | INTRAMUSCULAR | Status: AC
  Administered 2023-09-09: 10 mg via INTRAVENOUS
  Filled 2023-09-09: qty 2

## 2023-09-09 MED ORDER — MECLIZINE HCL 25 MG PO TABS: 25.0000 mg | ORAL_TABLET | Freq: Three times a day (TID) | ORAL | 0 refills | Status: DC | PRN

## 2023-09-09 MED ORDER — METOCLOPRAMIDE HCL 10 MG PO TABS: 10.0000 mg | ORAL_TABLET | Freq: Four times a day (QID) | ORAL | 0 refills | Status: DC

## 2023-09-09 MED ORDER — MECLIZINE HCL 25 MG PO TABS
25.0000 mg | ORAL_TABLET | Freq: Once | ORAL | Status: AC
  Administered 2023-09-09: 25 mg via ORAL
  Filled 2023-09-09: qty 1

## 2023-09-09 NOTE — ED Triage Notes (Addendum)
 Fell on Sunday. Slipped on ice hit back of head Was seen after fall Starting around 10AM, feeling dizzy and nausea. Headache   CT head from 09/07/23 shows subdural hematoma

## 2023-09-09 NOTE — ED Notes (Signed)
 Patient transported to CT

## 2023-09-09 NOTE — Discharge Instructions (Signed)
 You were seen in the emergency department for your headache and your dizziness.  Your head CT scan today showed no signs of worsening bleed and your EKG and labs are otherwise normal.  Your headaches and dizziness are likely related to your head injury though it is possible that the Keppra  could be worsening this.  I would recommend completing this on week prescription as prescribed.  You can keep taking Tylenol  every 6 hours as needed for your headache and Reglan  as needed for nausea.  You can take the meclizine  as needed for dizziness.  You should follow-up with your neurosurgeon as scheduled.  You should return to the emergency department for significantly worsening headaches, repetitive vomiting despite the nausea medicine, numbness or weakness on one side the body compared to the other or any other new or concerning symptoms.

## 2023-09-09 NOTE — ED Provider Notes (Signed)
 [place] EMERGENCY DEPARTMENT AT [facility] Provider Note   CSN: [id number] Arrival date & time: 09/09/23  1115     History  Chief Complaint  Patient presents with   [place]    Rebecca [name] is a 68 y.o. female.  Patient is a 68 year old female with a past medical history of breast cancer and recent fall with subdural hematoma presenting to the emergency department with headaches and dizziness.  Patient states that she slipped on ice on Sunday and was seen in the emergency department and was diagnosed with small subdural hematoma.  She states that she has been taking Tylenol  as needed for headaches since then as well as Keppra  prescribed.  She states that she has had persistent room spinning dizziness since the fall anytime she moves her head.  She states that she has been nauseous but denies any vomiting.  She states that her headaches have been persistent.  She denies any numbness or weakness in her arms or legs but states that her entire face felt tingly this morning.  States that she was concerned with her worsening symptoms which prompted her to come to the emergency department.  The history is provided by the patient and the spouse.  Fall       Home Medications Prior to Admission medications   Medication Sig Start Date End Date Taking? Authorizing Provider  meclizine  (ANTIVERT ) 25 MG tablet Take 1 tablet (25 mg total) by mouth 3 (three) times daily as needed for dizziness. 09/09/23  Yes Ellouise, Kimie Pidcock K, DO  metoCLOPramide  (REGLAN ) 10 MG tablet Take 1 tablet (10 mg total) by mouth every 6 (six) hours. 09/09/23  Yes Ellouise, Elvera Almario K, DO  albuterol  (VENTOLIN  HFA) 108 (90 Base) MCG/ACT inhaler Inhale 2 puffs into the lungs every 6 (six) hours as needed for wheezing or shortness of breath. 11/08/22   Cranford, Tonya, NP  hydrOXYzine  (VISTARIL ) 25 MG capsule Take 1 capsule (25 mg total) by mouth at bedtime as needed. Patient not taking: Reported on 09/04/2023 05/01/23    Cranford, Tonya, NP  Ibuprofen 200 MG CAPS Advil Patient not taking: Reported on 09/04/2023    [provider]  letrozole  (FEMARA ) 2.5 MG tablet TAKE ONE TABLET BY MOUTH DAILY 09/25/22   Odean Potts, MD  levETIRAcetam  (KEPPRA ) 500 MG tablet Take 1 tablet (500 mg total) by mouth 2 (two) times daily. 09/07/23   Geiple, Joshua, PA-C  LORazepam  (ATIVAN ) 0.5 MG tablet TAKE ONE TABLET BY MOUTH EVERY NIGHT AT BEDTIME AS NEEDED Patient not taking: Reported on 09/04/2023 11/25/22   Gudena, Vinay, MD  nystatin  cream (MYCOSTATIN ) Apply 1 Application topically 2 (two) times daily. Patient not taking: Reported on 09/04/2023 05/01/23   Cranford, Tonya, NP  phentermine  (ADIPEX-P ) 37.5 MG tablet Take 1/2 to 1 tablet every Morning for Dieting & Weight Loss Patient not taking: Reported on 09/04/2023 05/22/23   Tonita Fallow, MD  topiramate  (TOPAMAX ) 50 MG tablet Take 1/2 to 1 tablet 2 x /day at Suppertime & Bedtime for Dieting & Weight Loss Patient not taking: Reported on 09/04/2023 05/22/23   Tonita Fallow, MD  traZODone  (DESYREL ) 150 MG tablet Take  1/3 to 1/2 to 1 tablet    1 to 2 hours  before Bedtime  as Needed for Sleep 05/22/23   Tonita Fallow, MD      Allergies    Cholecalciferol, Citalopram, Dexamethasone , Dilaudid [hydromorphone hcl], Propoxycaine, Red yeast rice [cholestin], Rofecoxib, Sulfa antibiotics, and Sulfamethoxazole    Review of Systems  Review of Systems  Physical Exam Updated Vital Signs BP 137/84 (BP Location: Right Arm)   Pulse 66   Temp 98.3 F (36.8 C) (Oral)   Resp 16   LMP 08/03/2013   SpO2 100%  Physical Exam Vitals and nursing note reviewed.  Constitutional:      General: She is not in acute distress.    Appearance: Normal appearance.  HENT:     Head: Normocephalic.     Comments: Small palpable hematoma to posterior scalp    Nose: Nose normal.     Mouth/Throat:     Mouth: Mucous membranes are moist.     Pharynx: Oropharynx is clear.  Eyes:      Conjunctiva/sclera: Conjunctivae normal.     Pupils: Pupils are equal, round, and reactive to light.     Comments: Fatigable right side gaze nystagmus  Cardiovascular:     Rate and Rhythm: Normal rate and regular rhythm.     Heart sounds: Normal heart sounds.  Pulmonary:     Effort: Pulmonary effort is normal.     Breath sounds: Normal breath sounds.  Abdominal:     General: Abdomen is flat. There is no distension.     Palpations: Abdomen is soft.  Musculoskeletal:        General: Normal range of motion.     Cervical back: Normal range of motion.  Skin:    General: Skin is warm and dry.  Neurological:     General: No focal deficit present.     Mental Status: She is alert and oriented to person, place, and time.     Cranial Nerves: No cranial nerve deficit.     Sensory: No sensory deficit.     Motor: No weakness.     Coordination: Coordination normal.  Psychiatric:        Mood and Affect: Mood normal.        Behavior: Behavior normal.     ED Results / Procedures / Treatments   Labs (all labs ordered are listed, but only abnormal results are displayed) Labs Reviewed  BASIC METABOLIC PANEL  CBC WITH DIFFERENTIAL/PLATELET    EKG EKG Interpretation Date/Time:  Tuesday September 09 2023 13:33:59 EST Ventricular Rate:  60 PR Interval:  180 QRS Duration:  112 QT Interval:  402 QTC Calculation: 402 R Axis:   38  Text Interpretation: Sinus rhythm Incomplete right bundle branch block Low voltage, precordial leads No significant change since last tracing Confirmed by Ellouise Fine ([id number]) on 09/09/2023 1:51:04 PM  Radiology CT Head Wo Contrast Result Date: 09/09/2023 CLINICAL DATA:  Headache. Increasing frequency or severity. Fell 2 days ago with trauma to the back of the head. Follow-up subdural hematoma. EXAM: CT HEAD WITHOUT CONTRAST TECHNIQUE: Contiguous axial images were obtained from the base of the skull through the vertex without intravenous contrast. RADIATION DOSE  REDUCTION: This exam was performed according to the departmental dose-optimization program which includes automated exposure control, adjustment of the mA and/or kV according to patient size and/or use of iterative reconstruction technique. COMPARISON:  09/08/2023.  09/07/2023. FINDINGS: Brain: Subdural hematoma along the falx on the left is not enlarging. Minimal extension along the superior surface of the left tentorium. No intraparenchymal or subarachnoid blood. No evidence of recent stroke. No hydrocephalus. Maximal thickness 2-3 mm. No mass-effect upon the brain. No midline shift. Vascular: No abnormal vascular finding. Skull: No skull fracture Sinuses/Orbits: Clear/normal Other: None IMPRESSION: Subdural hematoma along the falx on the left is not enlarging. Maximal thickness  2-3 mm. No mass-effect upon the brain. No hydrocephalus. Electronically Signed   By: Oneil Officer M.D.   On: 09/09/2023 13:25   CT Head Wo Contrast Result Date: 09/08/2023 CLINICAL DATA:  Head trauma, moderate-severe SDH recheck Follow up CT Scan from 8hrs. EXAM: CT HEAD WITHOUT CONTRAST TECHNIQUE: Contiguous axial images were obtained from the base of the skull through the vertex without intravenous contrast. RADIATION DOSE REDUCTION: This exam was performed according to the departmental dose-optimization program which includes automated exposure control, adjustment of the mA and/or kV according to patient size and/or use of iterative reconstruction technique. COMPARISON:  CT head 09/07/2023 FINDINGS: Brain: No evidence of large-territorial acute infarction. No parenchymal hemorrhage. No mass lesion. Stable left parafalcine 2-3 mm subdural hematoma. No new extra-axial collection. No mass effect or midline shift. No hydrocephalus. Basilar cisterns are patent. Vascular: No hyperdense vessel. Atherosclerotic calcifications are present within the cavernous internal carotid arteries. Skull: No acute fracture or focal lesion. Sinuses/Orbits:  Paranasal sinuses and mastoid air cells are clear. The orbits are unremarkable. Other: None. IMPRESSION: 1. Stable left parafalcine 2-3 mm subdural hematoma. 2. No new findings. Electronically Signed   By: Morgane  Naveau M.D.   On: 09/08/2023 00:59   CT HEAD WO CONTRAST Result Date: 09/07/2023 CLINICAL DATA:  Head trauma, moderate to severe. Fall on ice today. Hit back of head. EXAM: CT HEAD WITHOUT CONTRAST TECHNIQUE: Contiguous axial images were obtained from the base of the skull through the vertex without intravenous contrast. RADIATION DOSE REDUCTION: This exam was performed according to the departmental dose-optimization program which includes automated exposure control, adjustment of the mA and/or kV according to patient size and/or use of iterative reconstruction technique. COMPARISON:  None Available. FINDINGS: Brain: 3 mm hemorrhage is noted along the left side of the inter cerebral falx. No other focal hemorrhage is present. No acute infarct or mass lesion is present. No significant white matter lesions are present. Deep brain nuclei are within normal limits. The brainstem and cerebellum are within normal limits. Midline structures are within normal limits. Vascular: No hyperdense vessel or unexpected calcification. Skull: Right paramedian parietal and occipital scalp soft tissue swelling is present without underlying fracture. Calvarium is intact. No focal lytic or blastic lesions are present. No other significant extra-axial soft tissue injury is present. Sinuses/Orbits: The paranasal sinuses and mastoid air cells are clear. The globes and orbits are within normal limits. IMPRESSION: 1. 3 mm hemorrhage along the left side of the inter cerebral falx compatible with a small subdural hematoma. 2. Right paramedian parietal and occipital scalp soft tissue swelling without underlying fracture. Critical Value/emergent results were called by telephone at the time of interpretation on 09/07/2023 at 5:32 pm to  provider MATTHEW TRIFAN , who verbally acknowledged these results. Electronically Signed   By: Lonni Necessary M.D.   On: 09/07/2023 17:32    Procedures Procedures    Medications Ordered in ED Medications  metoCLOPramide  (REGLAN ) injection 10 mg (10 mg Intravenous Given 09/09/23 1358)  meclizine  (ANTIVERT ) tablet 25 mg (25 mg Oral Given 09/09/23 1357)    ED Course/ Medical Decision Making/ A&P Clinical Course as of 09/09/23 1501  Tue Sep 09, 2023  1350 No change in size on SDH on CTH. Labs within normal range. EKG without acute disease. [VK]  1453 Upon reassessment, the patient reports that headache has resolved.  She is stated that she did not have much improvement with the meclizine .  She is otherwise stable for discharge home.  Recommended to  follow-up with neurosurgery as planned and was given strict return precautions. [VK]    Clinical Course User Index [VK] Kingsley, Mylon Mabey K, DO                                 Medical Decision Making This patient presents to the ED with chief complaint(s) of headache, dizziness with pertinent past medical history of breast cancer, recent traumatic subdural hematoma which further complicates the presenting complaint. The complaint involves an extensive differential diagnosis and also carries with it a high risk of complications and morbidity.    The differential diagnosis includes new or worsening ICH, mass effect, arrhythmia, anemia, dehydration, electrolyte abnormality, no focal neurologic deficits making CVA unlikely  Additional history obtained: Additional history obtained from spouse Records reviewed recent ED records  ED Course and Reassessment: On patient's arrival she is hemodynamically stable in no acute distress.  He has no focal neurologic deficits on her exam.  Patient will have repeat head CT performed today as well as EKG and labs to evaluate for alternative causes of her dizziness.  She did take Tylenol  last around 930 this  morning and will be given additional Reglan  and meclizine  for symptomatic management and will be closely reassessed.  Independent labs interpretation:  The following labs were independently interpreted: within normal range  Independent visualization of imaging: - I independently visualized the following imaging with scope of interpretation limited to determining acute life threatening conditions related to emergency care: CTH, which revealed stable appearing SDH  Consultation: - Consulted or discussed management/test interpretation w/ external professional: N/A  Consideration for admission or further workup: Patient has no emergent conditions requiring admission or further work-up at this time and is stable for discharge home with primary care and neurosurgery follow-up  Social Determinants of health: N/A    Amount and/or Complexity of Data Reviewed Labs: ordered. Radiology: ordered.  Risk Prescription drug management.          Final Clinical Impression(s) / ED Diagnoses Final diagnoses:  Traumatic subdural hematoma without loss of consciousness, subsequent encounter  Vertigo    Rx / DC Orders ED Discharge Orders          Ordered    meclizine  (ANTIVERT ) 25 MG tablet  3 times daily PRN        09/09/23 1455    metoCLOPramide  (REGLAN ) 10 MG tablet  Every 6 hours        09/09/23 1455              [place], Froylan Hobby K, DO 09/09/23 1501

## 2023-09-18 DIAGNOSIS — H2512 Age-related nuclear cataract, left eye: Secondary | ICD-10-CM | POA: Diagnosis not present

## 2023-09-18 DIAGNOSIS — H25043 Posterior subcapsular polar age-related cataract, bilateral: Secondary | ICD-10-CM | POA: Diagnosis not present

## 2023-09-18 DIAGNOSIS — H2513 Age-related nuclear cataract, bilateral: Secondary | ICD-10-CM | POA: Diagnosis not present

## 2023-09-18 DIAGNOSIS — H25013 Cortical age-related cataract, bilateral: Secondary | ICD-10-CM | POA: Diagnosis not present

## 2023-09-18 DIAGNOSIS — H18413 Arcus senilis, bilateral: Secondary | ICD-10-CM | POA: Diagnosis not present

## 2023-09-19 ENCOUNTER — Other Ambulatory Visit: Payer: Self-pay | Admitting: Hematology and Oncology

## 2023-09-19 DIAGNOSIS — Z6826 Body mass index (BMI) 26.0-26.9, adult: Secondary | ICD-10-CM | POA: Diagnosis not present

## 2023-09-19 DIAGNOSIS — S065XAA Traumatic subdural hemorrhage with loss of consciousness status unknown, initial encounter: Secondary | ICD-10-CM | POA: Diagnosis not present

## 2023-09-29 ENCOUNTER — Telehealth: Payer: Self-pay

## 2023-09-29 NOTE — Telephone Encounter (Signed)
error 

## 2023-11-04 ENCOUNTER — Other Ambulatory Visit (HOSPITAL_COMMUNITY): Payer: Self-pay | Admitting: Obstetrics and Gynecology

## 2023-11-04 DIAGNOSIS — Z01419 Encounter for gynecological examination (general) (routine) without abnormal findings: Secondary | ICD-10-CM | POA: Diagnosis not present

## 2023-11-04 DIAGNOSIS — Z8249 Family history of ischemic heart disease and other diseases of the circulatory system: Secondary | ICD-10-CM

## 2023-11-04 DIAGNOSIS — Z6827 Body mass index (BMI) 27.0-27.9, adult: Secondary | ICD-10-CM | POA: Diagnosis not present

## 2023-11-05 DIAGNOSIS — H2512 Age-related nuclear cataract, left eye: Secondary | ICD-10-CM | POA: Diagnosis not present

## 2023-11-06 DIAGNOSIS — H25011 Cortical age-related cataract, right eye: Secondary | ICD-10-CM | POA: Diagnosis not present

## 2023-11-06 DIAGNOSIS — H2511 Age-related nuclear cataract, right eye: Secondary | ICD-10-CM | POA: Diagnosis not present

## 2023-11-06 DIAGNOSIS — H25041 Posterior subcapsular polar age-related cataract, right eye: Secondary | ICD-10-CM | POA: Diagnosis not present

## 2023-11-10 ENCOUNTER — Encounter: Payer: Medicare Other | Admitting: Nurse Practitioner

## 2023-11-19 DIAGNOSIS — H2511 Age-related nuclear cataract, right eye: Secondary | ICD-10-CM | POA: Diagnosis not present

## 2023-12-04 ENCOUNTER — Encounter: Payer: Medicare Other | Admitting: Nurse Practitioner

## 2024-01-08 ENCOUNTER — Ambulatory Visit (HOSPITAL_COMMUNITY)
Admission: RE | Admit: 2024-01-08 | Discharge: 2024-01-08 | Disposition: A | Discharge status: Home / Self Care | Payer: Self-pay | Source: Ambulatory Visit | Attending: Obstetrics and Gynecology | Admitting: Obstetrics and Gynecology

## 2024-01-08 DIAGNOSIS — Z8249 Family history of ischemic heart disease and other diseases of the circulatory system: Secondary | ICD-10-CM | POA: Insufficient documentation

## 2024-03-11 ENCOUNTER — Inpatient Hospital Stay: Payer: Medicare Other | Attending: Hematology and Oncology | Admitting: Hematology and Oncology

## 2024-03-11 VITALS — BP 133/61 | HR 75 | Temp 97.6°F | Resp 18 | Ht 69.0 in | Wt 169.3 lb

## 2024-03-11 DIAGNOSIS — C50411 Malignant neoplasm of upper-outer quadrant of right female breast: Secondary | ICD-10-CM | POA: Insufficient documentation

## 2024-03-11 DIAGNOSIS — Z170 Estrogen receptor positive status [ER+]: Secondary | ICD-10-CM | POA: Diagnosis not present

## 2024-03-11 DIAGNOSIS — R0789 Other chest pain: Secondary | ICD-10-CM | POA: Insufficient documentation

## 2024-03-11 DIAGNOSIS — G4700 Insomnia, unspecified: Secondary | ICD-10-CM | POA: Insufficient documentation

## 2024-03-11 DIAGNOSIS — Z79811 Long term (current) use of aromatase inhibitors: Secondary | ICD-10-CM | POA: Diagnosis not present

## 2024-03-11 NOTE — Assessment & Plan Note (Signed)
 03/19/2017: Right lumpectomy: IDC with DCIS, 1.2 cm, grade 2, margins negative, 0/2 lymph nodes negative, ER 100%, PR 10%, HER-2 negative ratio 1.73, Ki-67 40% T1BN0 stage IA CHEK 2 Mutation Positive Oncotype Dx score: 41 (28% ROR with Tamoxifen alone) Treatment plan: 1. Adjuvant chemotherapy with dose dense Adriamycin  Cytoxan  4 followed by weekly Taxol  12 2. followed by adjuvant radiation 09/25/17-10/15/17 3. Followed by adjuvant antiestrogen therapy ----------------------------------------------------------------------------   Current treatment: Letrozole  2.5 mg daily started 11/26/2017 (plan for treatment: 7 years) Letrozole  toxicities: 1.  Muscle cramps: Much improved after using tonic water and CBD oil She has 2 grandchildren 74 and 61 years old   Insomnia issues: Currently on diazepam  that she uses sparingly.   Breast cancer surveillance: 1.  Breast exam 03/11/2024: Benign 2. Mammogram at [facility]: 06/27/2023: Benign breast density category A   Return to clinic in 1 year for follow-up.

## 2024-03-11 NOTE — Progress Notes (Signed)
 Patient Care Team: Royden Ronal Czar, FNP as PCP - General (Internal Medicine) Lavona Agent, MD as PCP - Cardiology (Cardiology) Tonita Fallow, MD (Internal Medicine) Burnette Fallow, MD as Consulting Physician (Gastroenterology) Leva Rush, MD as Consulting Physician (Obstetrics and Gynecology) Odean Potts, MD as Consulting Physician (Hematology and Oncology) Izell Domino, MD as Attending Physician (Radiation Oncology) Mikell Katz, MD (Inactive) as Consulting Physician (General Surgery)  DIAGNOSIS:  Encounter Diagnosis  Name Primary?   Carcinoma of upper-outer quadrant of right breast in female, estrogen receptor positive (HCC) Yes    SUMMARY OF ONCOLOGIC HISTORY: Oncology History  CHEK2-related breast cancer (HCC)  03/03/2017 Initial Diagnosis   CHEK2-related breast cancer (HCC)   Carcinoma of upper-outer quadrant of right breast in female, estrogen receptor positive (HCC)  02/18/2017 Initial Diagnosis   Right breast 1 cm mass 8 cm from nipple: biopsy 11:30 position: Grade 2 IDC with DCIS ER 100%, PR 10%, Ki-67 40%, HER-2 negative ratio 1.73; right breast 11:30 position 6 cm from nipple 0.7 cm biopsy: Fibrocystic change; T1b N0 stage IA clinical stage   03/09/2017 Genetic Testing   CHEK2 c.349A>G 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  The STAT Breast cancer panel offered by [facility] includes sequencing and rearrangement analysis for the following 9 genes:  ATM, BRCA1, BRCA2, CDH1, CHEK2, PALB2, PTEN, STK11 and TP53.   The report date is March 09, 2017.  Re-requitisioned to the common hereditary cancer panel and the melanoma panel.  The additional genes tested were negative.  The Hereditary Gene Panel offered by [facility] includes sequencing and/or deletion duplication testing of the following 46 genes: APC, ATM, AXIN2, BARD1, BMPR1A, BRCA1, BRCA2, BRIP1, CDH1, CDKN2A (p14ARF), CDKN2A (p16INK4a), CHEK2, CTNNA1, DICER1, EPCAM (Deletion/duplication testing  only), GREM1 (promoter region deletion/duplication testing only), KIT, MEN1, MLH1, MSH2, MSH3, MSH6, MUTYH, NBN, NF1, NHTL1, PALB2, PDGFRA, PMS2, POLD1, POLE, PTEN, RAD50, RAD51C, RAD51D, SDHB, SDHC, SDHD, SMAD4, SMARCA4. STK11, TP53, TSC1, TSC2, and VHL.  The following genes were evaluated for sequence changes only: SDHA and HOXB13 c.251G>A variant only.  The Melanoma panel offered by [facility] includes sequencing and/or deletion duplication testing of the following 12 genes: BAP1, BRCA1, BRCA2, BRIP1, CDK4, CDKN2A (p14ARF), CDKN2A (p16INK4a), MC1R, POT1, PTEN, RB1, TERT, and TP53.  The following gene was evaluated for sequence changes only: MITF (c.952G>A, p.GLU318Lys variant only). The updated report date is March 14, 2017.   UPDATE:CHEK2 c.349A>G has been upgraded from a Likely Pathogenic variant to a Pathogenic variant.  The change in variant classification was made as a result of re-review of the evidence in light of new variant interpretation guidelines and/or new information. The updated report date is March 03, 2020.     03/19/2017 Surgery   Right lumpectomy: IDC with DCIS, 1.2 cm, grade 2, margins negative, 0/2 lymph nodes negative, ER 100%, PR 10%, HER-2 negative ratio 1.73, Ki-67 40% T1BN0 stage IA   04/01/2017 Oncotype testing   Oncotype Dx score: 41 (28% ROR with Tamoxifen alone)   04/16/2017 - 08/27/2017 Chemotherapy   Dose dense Adriamycin  and Cytoxan  4 followed by Taxol  weekly 12    09/24/2017 - 10/17/2017 Radiation Therapy   Adjuvant radiation   11/28/2017 -  Anti-estrogen oral therapy   Letrozole  2.5 mg daily     CHIEF COMPLIANT: Follow-up on letrozole  therapy  HISTORY OF PRESENT ILLNESS:  History of Present Illness Rebecca [name] is a 68 year old female with breast cancer on letrozole  therapy who presents for a follow-up visit.  She has  been on letrozole  therapy for six years and four months, with treatment expected to conclude in April next year. She has enough  refills to last until the end of the year.  She experiences persistent chest discomfort.  She has resolved a past issue with deodorant causing skin irritation by switching to a new product. She is trying to reduce her use of sleeping pills but still struggles with sleep and feels tired. Occasionally, she takes half a sleeping pill after poor rest.  She has noticed a sore tongue after eating peaches, suspecting a seasonal allergy, but no other allergic symptoms are present.     ALLERGIES:  is allergic to cholecalciferol, citalopram, dexamethasone , dilaudid [hydromorphone hcl], nitrofurantoin , propoxycaine, red yeast rice [cholestin], rofecoxib, sulfa antibiotics, and sulfamethoxazole.  MEDICATIONS:  Current Outpatient Medications  Medication Sig Dispense Refill   Ibuprofen 200 MG CAPS Advil     letrozole  (FEMARA ) 2.5 MG tablet TAKE 1 TABLET BY MOUTH DAILY 90 tablet 3   LORazepam  (ATIVAN ) 0.5 MG tablet TAKE ONE TABLET BY MOUTH EVERY NIGHT AT BEDTIME AS NEEDED 30 tablet 3   traZODone  (DESYREL ) 150 MG tablet Take  1/3 to 1/2 to 1 tablet    1 to 2 hours  before Bedtime  as Needed for Sleep 90 tablet 0   No current facility-administered medications for this visit.    PHYSICAL EXAMINATION: ECOG PERFORMANCE STATUS: 1 - Symptomatic but completely ambulatory  Vitals:   03/11/24 1354  BP: 133/61  Pulse: 75  Resp: 18  Temp: 97.6 F (36.4 C)  SpO2: 100%   Filed Weights   03/11/24 1354  Weight: 169 lb 4.8 oz (76.8 kg)    Physical Exam No palpable lumps or nodules of bilateral breasts or axilla  (exam performed in the presence of a chaperone)  LABORATORY DATA:  I have reviewed the data as listed    Latest Ref Rng & Units 09/09/2023    1:12 PM 09/04/2023    3:39 PM 05/22/2023    2:17 PM  CMP  Glucose 70 - 99 mg/dL 97  85  94   BUN 8 - 23 mg/dL 15  18  17    Creatinine 0.44 - 1.00 mg/dL 9.33  9.42  9.42   Sodium 135 - 145 mmol/L 139  139  140   Potassium 3.5 - 5.1 mmol/L 4.1  4.2   4.5   Chloride 98 - 111 mmol/L 104  103  104   CO2 22 - 32 mmol/L 27  27  28    Calcium 8.9 - 10.3 mg/dL 9.0  9.3  9.5   Total Protein 6.1 - 8.1 g/dL  7.0  6.6   Total Bilirubin 0.2 - 1.2 mg/dL  0.7  0.8   AST 10 - 35 U/L  16  15   ALT 6 - 29 U/L  13  14     Lab Results  Component Value Date   WBC 8.1 09/09/2023   HGB 13.5 09/09/2023   HCT 40.1 09/09/2023   MCV 90.9 09/09/2023   PLT 215 09/09/2023   NEUTROABS 6.5 09/09/2023    ASSESSMENT & PLAN:  Carcinoma of upper-outer quadrant of right breast in female, estrogen receptor positive (HCC) 03/19/2017: Right lumpectomy: IDC with DCIS, 1.2 cm, grade 2, margins negative, 0/2 lymph nodes negative, ER 100%, PR 10%, HER-2 negative ratio 1.73, Ki-67 40% T1BN0 stage IA CHEK 2 Mutation Positive Oncotype Dx score: 41 (28% ROR with Tamoxifen alone) Treatment plan: 1. Adjuvant chemotherapy with dose dense Adriamycin   Cytoxan  4 followed by weekly Taxol  12 2. followed by adjuvant radiation 09/25/17-10/15/17 3. Followed by adjuvant antiestrogen therapy ----------------------------------------------------------------------------   Current treatment: Letrozole  2.5 mg daily started 11/26/2017 (plan for treatment: 7 years) Letrozole  toxicities: 1.  Muscle cramps: Much improved after using tonic water and CBD oil She has 2 grandchildren 52 and 91 years old   Insomnia issues: Currently on diazepam  that she uses sparingly.   Breast cancer surveillance: 1.  Breast exam 03/11/2024: Benign 2. Mammogram at [facility]: 06/27/2023: Benign breast density category A She is doing her mammograms at her GYN office.   Return to clinic in 1 year for follow-up.  ------------------------------------- Assessment and Plan Assessment & Plan Carcinoma of upper-outer quadrant of right breast, estrogen receptor positive Estrogen receptor positive breast cancer on letrozole  for six years and four months. Plan to discontinue letrozole  after seven years as per guidelines. -  Continue letrozole  until April next year to complete seven years. - Ensure sufficient refills of letrozole  until treatment end. - Schedule follow-up in one year for reassessment and potential discontinuation.  Possible seasonal food allergy Sore and swollen tongue after consuming peaches suggests possible seasonal food allergy. - Advise trying a different variety of peaches to see if symptoms persist.      No orders of the defined types were placed in this encounter.  The patient has a good understanding of the overall plan. she agrees with it. she will call with any problems that may develop before the next visit here. Total time spent: 30 mins including face to face time and time spent for planning, charting and co-ordination of care   Viinay K Kaulana Brindle, MD 03/11/24

## 2024-03-18 ENCOUNTER — Ambulatory Visit: Payer: Medicare Other | Admitting: Nurse Practitioner

## 2024-04-29 ENCOUNTER — Ambulatory Visit: Admitting: Dermatology

## 2024-05-24 ENCOUNTER — Ambulatory Visit: Payer: Medicare Other | Admitting: Internal Medicine

## 2024-05-27 ENCOUNTER — Ambulatory Visit: Payer: Medicare Other | Admitting: Internal Medicine

## 2024-06-24 ENCOUNTER — Ambulatory Visit: Payer: Medicare Other | Admitting: Internal Medicine

## 2024-08-12 NOTE — Progress Notes (Signed)
 Pt called with concerns about using estrogen cream for her recurrent UTI's while on Letrozole . Per MD pt fine to continue using a pea sized amount of estrogen cream a couple of times a week. Patient verbalized understanding.

## 2024-09-06 ENCOUNTER — Encounter: Payer: Self-pay | Admitting: Registered Nurse

## 2024-09-07 ENCOUNTER — Ambulatory Visit: Payer: Medicare Other | Admitting: Nurse Practitioner

## 2024-09-21 ENCOUNTER — Other Ambulatory Visit: Payer: Self-pay | Admitting: Obstetrics and Gynecology

## 2024-09-21 DIAGNOSIS — R928 Other abnormal and inconclusive findings on diagnostic imaging of breast: Secondary | ICD-10-CM

## 2024-09-22 ENCOUNTER — Other Ambulatory Visit: Payer: Self-pay | Admitting: Obstetrics and Gynecology

## 2024-09-22 ENCOUNTER — Inpatient Hospital Stay
Admission: RE | Admit: 2024-09-22 | Discharge: 2024-09-22 | Attending: Obstetrics and Gynecology | Admitting: Obstetrics and Gynecology

## 2024-09-22 DIAGNOSIS — R928 Other abnormal and inconclusive findings on diagnostic imaging of breast: Secondary | ICD-10-CM

## 2024-09-22 DIAGNOSIS — N6311 Unspecified lump in the right breast, upper outer quadrant: Secondary | ICD-10-CM

## 2024-09-24 ENCOUNTER — Ambulatory Visit
Admission: RE | Admit: 2024-09-24 | Discharge: 2024-09-24 | Disposition: A | Source: Ambulatory Visit | Attending: Obstetrics and Gynecology | Admitting: Obstetrics and Gynecology

## 2024-09-24 DIAGNOSIS — N6311 Unspecified lump in the right breast, upper outer quadrant: Secondary | ICD-10-CM

## 2024-09-27 LAB — SURGICAL PATHOLOGY

## 2024-09-28 ENCOUNTER — Telehealth: Payer: Self-pay | Admitting: *Deleted

## 2024-09-28 ENCOUNTER — Other Ambulatory Visit

## 2024-09-29 ENCOUNTER — Other Ambulatory Visit: Payer: Self-pay | Admitting: Hematology and Oncology

## 2024-09-29 ENCOUNTER — Ambulatory Visit: Payer: Self-pay | Admitting: General Surgery

## 2024-09-29 DIAGNOSIS — C50411 Malignant neoplasm of upper-outer quadrant of right female breast: Secondary | ICD-10-CM

## 2024-09-30 ENCOUNTER — Other Ambulatory Visit: Payer: Self-pay

## 2024-09-30 ENCOUNTER — Encounter: Payer: Self-pay | Admitting: Rehabilitation

## 2024-09-30 ENCOUNTER — Ambulatory Visit: Admitting: Rehabilitation

## 2024-09-30 DIAGNOSIS — Z483 Aftercare following surgery for neoplasm: Secondary | ICD-10-CM

## 2024-09-30 DIAGNOSIS — C50411 Malignant neoplasm of upper-outer quadrant of right female breast: Secondary | ICD-10-CM

## 2024-09-30 DIAGNOSIS — Z9189 Other specified personal risk factors, not elsewhere classified: Secondary | ICD-10-CM

## 2024-09-30 NOTE — Therapy (Signed)
 " OUTPATIENT PHYSICAL THERAPY BREAST CANCER BASELINE EVALUATION   Patient Name: Rebecca [name] MRN: [id number] DOB:1956-03-14, 69 y.o., female Today's Date: 09/30/2024  END OF SESSION:  PT End of Session - 09/30/24 1357     Visit Number 1    Number of Visits 2    Date for Recertification  12/23/24    Authorization Type none    PT Start Time 1200    PT Stop Time 1245    PT Time Calculation (min) 45 min    Activity Tolerance Patient tolerated treatment well    Behavior During Therapy WFL for tasks assessed/performed          Past Medical History:  Diagnosis Date   Depression    REMISSION/ SITUATIONAL   Dyspnea    Hepatitis AGE 30   RESOLVED   History of kidney stones    History of radiation therapy 09/24/17- 10/15/17   Right Breast treated to 42.56 Gy with 16 fx of 2.66 Gy   Hyperlipidemia    Insomnia    Migraines    HORMONAL   Personal history of chemotherapy    Personal history of radiation therapy    PONV (postoperative nausea and vomiting)    right breast ca dx' d 02/2017   Shingles outbreak 2003, 2015   RIGHT HIP, and between breasts 3 years ago   Past Surgical History:  Procedure Laterality Date   BREAST BIOPSY Right 09/24/2024   US  RT BREAST BX W LOC DEV 1ST LESION IMG BX SPEC US  GUIDE 09/24/2024 [facility]   BREAST EXCISIONAL BIOPSY Right    BREAST LUMPECTOMY Right 02/2017   BREAST LUMPECTOMY WITH RADIOACTIVE SEED AND SENTINEL LYMPH NODE BIOPSY Right 03/19/2017   Procedure: RIGHT BREAST LUMPECTOMY WITH RADIOACTIVE SEED AND SENTINEL LYMPH NODE BIOPSY;  Surgeon: Mikell Katz, MD;  Location: [facility];  Service: General;  Laterality: Right;   CYSTOSCOPY WITH RETROGRADE PYELOGRAM, URETEROSCOPY AND STENT PLACEMENT Bilateral 08/31/2021   Procedure: CYSTOSCOPY WITH BILATERAL RETROGRADE PYELOGRAM, URETEROSCOPY AND STENT PLACEMENT;  Surgeon: Alvaro Hummer, MD;  Location: [facility];  Service: Urology;  Laterality: Bilateral;    HOLMIUM LASER APPLICATION Bilateral 08/31/2021   Procedure: HOLMIUM LASER APPLICATION;  Surgeon: Alvaro Hummer, MD;  Location: [facility];  Service: Urology;  Laterality: Bilateral;   KNEE ARTHROSCOPY Right 2002   TUBAL LIGATION     uterine polyps  2016   Patient Active Problem List   Diagnosis Date Noted   Osteoarthritis of both knees 03/29/2021   Osteoarthritis cervical spine 03/29/2021   DOE (dyspnea on exertion) 12/02/2019   Educated about COVID-19 virus infection 12/02/2019   Port catheter in place 04/23/2017   Genetic testing 03/10/2017   Carcinoma of upper-outer quadrant of right breast in female, estrogen receptor positive (HCC) 03/05/2017   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/03/2017   Family history of breast cancer    Family history of melanoma    Family history of colon cancer    Family history of brain cancer    Migraine 08/24/2015   Hyperlipidemia 07/02/2013   Vitamin D  deficiency 07/02/2013    PCP: Leeroy Marie, FNP  REFERRING PROVIDER: Deward Null, MD  REFERRING DIAG:  Diagnosis  C50.411,Z17.1 (ICD-10-CM) - Malignant neoplasm of upper-outer quadrant of right breast in female, estrogen receptor negative (HCC)    THERAPY DIAG:  Carcinoma of upper-outer quadrant of right breast in female, estrogen receptor positive (HCC)  At risk for lymphedema  Aftercare following surgery for neoplasm  Rationale for Evaluation and Treatment: Rehabilitation  ONSET DATE: 09-24-24  SUBJECTIVE:                                                                                                                                                                                           SUBJECTIVE STATEMENT: Patient reports she is here today to be seen by her medical team for her newly diagnosed right breast cancer. That side has always felt just a little different.  It is a little swollen now.    PERTINENT HISTORY:  Patient was diagnosed on 09-24-24 with right grade 2  IDC. It measures 1.4 cm and is located in the upper outer quadrant. It is ER/PR pos, HER2neg with a Ki67 of 40%.  7 years post Rt lumpectomy and SLNB (2 neg nodes) with chemo radiation and letrozole . Will have bil mastectomy due to prior cancer and CHEK2 mutation. Other Hx: kidney stones and cyst, fibroids and diverticulitis, neck OA, back pain  PATIENT GOALS:   reduce lymphedema risk and learn post op HEP.   PAIN:  Are you having pain? Yes: NPRS scale: 8/10 Pain location: Rt breast Pain description: sharp, tender Aggravating factors: laying on that side Relieving factors: advil  PRECAUTIONS: Active CA, baseline lymphedema risk Rt UE  RED FLAGS: None   HAND DOMINANCE: right  WEIGHT BEARING RESTRICTIONS: No  FALLS:  Has patient fallen in last 6 months? No  LIVING ENVIRONMENT: Patient lives with: husband Lives in: House/apartment  OCCUPATION: theatre stage manager.  Get as much time as I need off   LEISURE: nothing really, cleaning   PRIOR LEVEL OF FUNCTION: Independent   OBJECTIVE: Note: Objective measures were completed at Evaluation unless otherwise noted.  COGNITION: Overall cognitive status: Within functional limits for tasks assessed    POSTURE:  Forward head and rounded shoulders posture  UPPER EXTREMITY AROM/PROM:  A/PROM RIGHT   eval   Shoulder extension 50  Shoulder flexion 144  Shoulder abduction 155  Shoulder internal rotation 80  Shoulder external rotation 80    (Blank rows = not tested)  A/PROM LEFT   eval  Shoulder extension 55  Shoulder flexion 160  Shoulder abduction 145  Shoulder internal rotation 75  Shoulder external rotation 90    (Blank rows = not tested)  CERVICAL AROM: All within normal limits:   UPPER EXTREMITY STRENGTH: 5/5 bil no pain   LYMPHEDEMA ASSESSMENTS (in cm):   LANDMARK RIGHT   eval  10 cm proximal to olecranon process from proximal aspect of olecranon 27.5  Olecranon process 25.5  10 cm proximal  to ulnar styloid process from proximal aspect of  styloid process 20.3  Just distal to ulnar styloid process 16.2  Across hand at thumb web space 20  At base of 2nd digit 6.5  (Blank rows = not tested)  LANDMARK LEFT   eval  10 cm proximal to olecranon process from proximal aspect of olecranon 27.5   Olecranon process 26  10 cm proximal to ulnar styloid process from proximal aspect of styloid process 20  Just distal to ulnar styloid process 16.4  Across hand at thumb web space 19.1  At base of 2nd digit 6.2  (Blank rows = not tested)  L-DEX LYMPHEDEMA SCREENING: The patient was assessed using the L-Dex machine today to produce a lymphedema index baseline score. The patient will be reassessed on a regular basis (typically every 3 months) to obtain new L-Dex scores. If the score is > 6.5 points away from his/her baseline score indicating onset of subclinical lymphedema, it will be recommended to wear a compression garment for 4 weeks, 12 hours per day and then be reassessed. If the score continues to be > 6.5 points from baseline at reassessment, we will initiate lymphedema treatment. Assessing in this manner has a 95% rate of preventing clinically significant lymphedema.  QUICK DASH SURVEY: 20% limited   PATIENT EDUCATION:  Education details: Time spent educating patient on aspects of self-care to maximize post op recovery. Patient was educated on where and how to get a post op compression bra to use to reduce post op edema. Patient was also educated on the use of SOZO screenings and surveillance principles for early identification of lymphedema onset. She was instructed to use the post op pillow in the axilla for pressure and pain relief. Patient educated on lymphedema risk reduction and post op shoulder/posture HEP. Person educated: Patient Education method: Explanation, Demonstration, Handout Education comprehension: Patient verbalized understanding and returned demonstration  HOME  EXERCISE PROGRAM: Patient was instructed today in a home exercise program today for post op shoulder range of motion. These included active assist shoulder flexion in sitting, scapular retraction, wall walking with shoulder abduction, and hands behind head external rotation.  She was encouraged to do these twice a day, holding 3 seconds and repeating 5 times when permitted by her physician.   ASSESSMENT:  CLINICAL IMPRESSION: Pt is planning to have a bil mastectomy with Rt SLNB at an unscheduled time with most likely expander placement. She has a hx of cancer on this same side with 2 LN removed 7 years ago.  No signs of lymphedema.  She will benefit from a post op PT reassessment to determine needs and from L-Dex screens every 3 months for 2 years to detect subclinical lymphedema.  Pt will benefit from skilled therapeutic intervention to improve on the following deficits: Decreased knowledge of precautions, impaired UE functional use, pain, decreased ROM, postural dysfunction.   PT treatment/interventions: ADL/self-care home management, pt/family education, therapeutic exercise  REHAB POTENTIAL: Excellent  CLINICAL DECISION MAKING: Stable/uncomplicated  EVALUATION COMPLEXITY: Low   GOALS: Goals reviewed with patient? YES  LONG TERM GOALS: (STG=LTG)    Name Target Date Goal status  1 Pt will be able to verbalize understanding of pertinent lymphedema risk reduction practices relevant to her dx specifically related to skin care.  Baseline:  No knowledge 09/30/2024 Achieved at eval  2 Pt will be able to return demo and/or verbalize understanding of the post op HEP related to regaining shoulder ROM. Baseline:  No knowledge 09/30/2024 Achieved at eval  3 Pt will be able to verbalize understanding  of the importance of viewing the post op After Breast CA Class video for further lymphedema risk reduction education and therapeutic exercise.  Baseline:  No knowledge 09/30/2024 Achieved at eval  4 Pt  will demo she has regained full shoulder ROM and function post operatively compared to baselines.  Baseline: See objective measurements taken today. 12/23/24     PLAN:  PT FREQUENCY/DURATION: EVAL and 1 follow up appointment.   PLAN FOR NEXT SESSION: will reassess 3-4 weeks post op to determine needs.   Patient will follow up at outpatient cancer rehab 3-4 weeks following surgery.  If the patient requires physical therapy at that time, a specific plan will be dictated and sent to the referring physician for approval. The patient was educated today on appropriate basic range of motion exercises to begin post operatively and the importance of viewing the After Breast Cancer class video following surgery.  Patient was educated today on lymphedema risk reduction practices as it pertains to recommendations that will benefit the patient immediately following surgery.  She verbalized good understanding.    Physical Therapy Information for After Breast Cancer Surgery/Treatment:  Lymphedema is a swelling condition that you may be at risk for in your arm if you have lymph nodes removed from the armpit area.  After a sentinel node biopsy, the risk is approximately 5-9% and is higher after an axillary node dissection.  There is treatment available for this condition and it is not life-threatening.  Contact your physician or physical therapist with concerns. You may begin the 4 shoulder/posture exercises (see additional sheet) when permitted by your physician (typically a week after surgery).  If you have drains, you may need to wait until those are removed before beginning range of motion exercises.  A general recommendation is to not lift your arms above shoulder height until drains are removed.  These exercises should be done to your tolerance and gently.  This is not a no pain/no gain type of recovery so listen to your body and stretch into the range of motion that you can tolerate, stopping if you have  pain.  If you are having immediate reconstruction, ask your plastic surgeon about doing exercises as he or she may want you to wait. We encourage you to view the After Breast Cancer class video following surgery.  You will learn information related to lymphedema risk, prevention and treatment and additional exercises to regain mobility following surgery.   While undergoing any medical procedure or treatment, try to avoid blood pressure being taken or needle sticks from occurring on the arm on the side of cancer.   This recommendation begins after surgery and continues for the rest of your life.  This may help reduce your risk of getting lymphedema (swelling in your arm). An excellent resource for those seeking information on lymphedema is the National Lymphedema Network's web site. It can be accessed at www.lymphnet.org If you notice swelling in your hand, arm or breast at any time following surgery (even if it is many years from now), please contact your doctor or physical therapist to discuss this.  Lymphedema can be treated at any time but it is easier for you if it is treated early on.  If you feel like your shoulder motion is not returning to normal in a reasonable amount of time, please contact your surgeon or physical therapist.  [facility] [phone]. [address], [place] [address]  [facility] After Breast Cancer Class  After Breast Cancer Class is a specially designed exercise class video to assist you in a safe recover after having breast cancer surgery.  In this video you will learn how to get back to full function whether your drains were just removed or if you had surgery a month ago. The video can be viewed on this page: [email or web address]-meyer.[email or web address] or on YouTube here: [email or web address].  Class Goals  Understand specific stretches to improve the flexibility of you  chest and shoulder. Learn ways to safely strengthen your upper body and improve your posture. Understand the warning signs of infection and why you may be at risk for an arm infection. Learn about Lymphedema and prevention.  ** You do not need to view this video until after surgery.  Drains should be removed to participate in the recommended exercises on the video.  Patient was instructed today in a home exercise program today for post op shoulder range of motion. These included active assist shoulder flexion in sitting, scapular retraction, wall walking with shoulder abduction, and hands behind head external rotation.  She was encouraged to do these twice a day, holding 3 seconds and repeating 5 times when permitted by her physician.    Larue Saddie SAUNDERS, PT 09/30/2024, 1:59 PM   "

## 2024-10-04 ENCOUNTER — Inpatient Hospital Stay: Admitting: Hematology and Oncology

## 2024-10-05 ENCOUNTER — Institutional Professional Consult (permissible substitution): Admitting: Plastic Surgery

## 2025-03-14 ENCOUNTER — Ambulatory Visit: Admitting: Hematology and Oncology
